# Patient Record
Sex: Male | Born: 1950 | Race: Black or African American | Hispanic: No | Marital: Single | Smoking: Never smoker
Health system: Southern US, Community
[De-identification: ages and names within clinical notes are randomized; demographics above are authoritative.]

## PROBLEM LIST (undated history)

## (undated) DIAGNOSIS — E7800 Pure hypercholesterolemia, unspecified: Secondary | ICD-10-CM

## (undated) DIAGNOSIS — L989 Disorder of the skin and subcutaneous tissue, unspecified: Secondary | ICD-10-CM

## (undated) DIAGNOSIS — E1129 Type 2 diabetes mellitus with other diabetic kidney complication: Secondary | ICD-10-CM

## (undated) DIAGNOSIS — Z973 Presence of spectacles and contact lenses: Secondary | ICD-10-CM

## (undated) DIAGNOSIS — E119 Type 2 diabetes mellitus without complications: Secondary | ICD-10-CM

## (undated) DIAGNOSIS — I10 Essential (primary) hypertension: Secondary | ICD-10-CM

## (undated) DIAGNOSIS — G629 Polyneuropathy, unspecified: Secondary | ICD-10-CM

## (undated) DIAGNOSIS — G4730 Sleep apnea, unspecified: Secondary | ICD-10-CM

## (undated) DIAGNOSIS — Z972 Presence of dental prosthetic device (complete) (partial): Secondary | ICD-10-CM

## (undated) HISTORY — PX: BACK SURGERY: SHX140

---

## 2001-07-31 ENCOUNTER — Emergency Department (HOSPITAL_COMMUNITY): Admission: EM | Admit: 2001-07-31 | Discharge: 2001-07-31 | Payer: Self-pay

## 2002-06-03 ENCOUNTER — Ambulatory Visit (HOSPITAL_BASED_OUTPATIENT_CLINIC_OR_DEPARTMENT_OTHER): Admission: RE | Admit: 2002-06-03 | Discharge: 2002-06-03 | Payer: Self-pay

## 2013-06-20 DIAGNOSIS — M7550 Bursitis of unspecified shoulder: Secondary | ICD-10-CM | POA: Insufficient documentation

## 2013-06-20 DIAGNOSIS — M75122 Complete rotator cuff tear or rupture of left shoulder, not specified as traumatic: Secondary | ICD-10-CM | POA: Insufficient documentation

## 2013-06-20 DIAGNOSIS — M19019 Primary osteoarthritis, unspecified shoulder: Secondary | ICD-10-CM | POA: Insufficient documentation

## 2013-06-20 DIAGNOSIS — M7522 Bicipital tendinitis, left shoulder: Secondary | ICD-10-CM | POA: Insufficient documentation

## 2013-07-08 DIAGNOSIS — E669 Obesity, unspecified: Secondary | ICD-10-CM | POA: Insufficient documentation

## 2013-07-08 DIAGNOSIS — G629 Polyneuropathy, unspecified: Secondary | ICD-10-CM | POA: Insufficient documentation

## 2013-07-15 DIAGNOSIS — Z9889 Other specified postprocedural states: Secondary | ICD-10-CM | POA: Insufficient documentation

## 2013-08-26 ENCOUNTER — Ambulatory Visit: Payer: Non-veteran care | Attending: Physician Assistant | Admitting: Rehabilitation

## 2013-08-26 DIAGNOSIS — M25619 Stiffness of unspecified shoulder, not elsewhere classified: Secondary | ICD-10-CM | POA: Insufficient documentation

## 2013-08-26 DIAGNOSIS — IMO0001 Reserved for inherently not codable concepts without codable children: Secondary | ICD-10-CM | POA: Insufficient documentation

## 2013-08-26 DIAGNOSIS — M25519 Pain in unspecified shoulder: Secondary | ICD-10-CM | POA: Insufficient documentation

## 2013-08-28 ENCOUNTER — Ambulatory Visit: Payer: Non-veteran care | Admitting: Rehabilitation

## 2013-08-30 ENCOUNTER — Ambulatory Visit: Payer: Non-veteran care | Admitting: Rehabilitation

## 2013-09-03 ENCOUNTER — Ambulatory Visit: Payer: Non-veteran care | Admitting: Rehabilitation

## 2013-09-10 ENCOUNTER — Ambulatory Visit: Payer: Non-veteran care | Admitting: Rehabilitation

## 2013-09-12 ENCOUNTER — Ambulatory Visit: Payer: Non-veteran care | Admitting: Rehabilitation

## 2013-09-18 ENCOUNTER — Ambulatory Visit: Payer: Non-veteran care | Admitting: Rehabilitation

## 2013-09-19 ENCOUNTER — Ambulatory Visit: Payer: Non-veteran care | Admitting: Rehabilitation

## 2013-09-25 ENCOUNTER — Ambulatory Visit: Payer: Non-veteran care | Attending: Physician Assistant | Admitting: Rehabilitation

## 2013-09-25 DIAGNOSIS — M25519 Pain in unspecified shoulder: Secondary | ICD-10-CM | POA: Insufficient documentation

## 2013-09-25 DIAGNOSIS — IMO0001 Reserved for inherently not codable concepts without codable children: Secondary | ICD-10-CM | POA: Insufficient documentation

## 2013-09-25 DIAGNOSIS — M25619 Stiffness of unspecified shoulder, not elsewhere classified: Secondary | ICD-10-CM | POA: Insufficient documentation

## 2013-09-30 ENCOUNTER — Ambulatory Visit: Payer: Non-veteran care | Admitting: Rehabilitation

## 2013-10-02 ENCOUNTER — Ambulatory Visit: Payer: Non-veteran care | Admitting: Rehabilitation

## 2013-10-07 ENCOUNTER — Ambulatory Visit: Payer: Non-veteran care | Admitting: Rehabilitation

## 2013-10-10 ENCOUNTER — Ambulatory Visit: Payer: Non-veteran care | Admitting: Rehabilitation

## 2013-10-15 ENCOUNTER — Ambulatory Visit: Payer: Non-veteran care | Admitting: Rehabilitation

## 2013-10-17 ENCOUNTER — Ambulatory Visit: Payer: Non-veteran care | Admitting: Rehabilitation

## 2013-10-23 ENCOUNTER — Ambulatory Visit: Payer: Non-veteran care | Attending: Physician Assistant | Admitting: Rehabilitation

## 2013-10-23 DIAGNOSIS — IMO0001 Reserved for inherently not codable concepts without codable children: Secondary | ICD-10-CM | POA: Insufficient documentation

## 2013-10-23 DIAGNOSIS — M25519 Pain in unspecified shoulder: Secondary | ICD-10-CM | POA: Insufficient documentation

## 2013-10-23 DIAGNOSIS — M25619 Stiffness of unspecified shoulder, not elsewhere classified: Secondary | ICD-10-CM | POA: Insufficient documentation

## 2013-10-25 ENCOUNTER — Ambulatory Visit: Payer: Non-veteran care | Admitting: Rehabilitation

## 2013-10-28 ENCOUNTER — Ambulatory Visit: Payer: Non-veteran care | Admitting: Rehabilitation

## 2013-10-31 ENCOUNTER — Ambulatory Visit: Payer: Non-veteran care | Admitting: Rehabilitation

## 2013-11-11 ENCOUNTER — Ambulatory Visit: Payer: Non-veteran care | Admitting: Rehabilitation

## 2015-02-24 ENCOUNTER — Encounter (HOSPITAL_BASED_OUTPATIENT_CLINIC_OR_DEPARTMENT_OTHER): Payer: Self-pay

## 2015-02-24 ENCOUNTER — Encounter (HOSPITAL_COMMUNITY): Admission: EM | Disposition: A | Discharge status: Home / Self Care | Payer: Self-pay | Source: Home / Self Care

## 2015-02-24 ENCOUNTER — Emergency Department (HOSPITAL_COMMUNITY): Payer: Medicare Other | Admitting: Certified Registered Nurse Anesthetist

## 2015-02-24 ENCOUNTER — Emergency Department (HOSPITAL_BASED_OUTPATIENT_CLINIC_OR_DEPARTMENT_OTHER): Payer: Medicare Other

## 2015-02-24 ENCOUNTER — Inpatient Hospital Stay (HOSPITAL_BASED_OUTPATIENT_CLINIC_OR_DEPARTMENT_OTHER)
Admission: EM | Admit: 2015-02-24 | Discharge: 2015-03-05 | DRG: 853 | Disposition: A | Discharge status: Home / Self Care | Payer: Medicare Other | Attending: Surgery | Admitting: Surgery

## 2015-02-24 DIAGNOSIS — E876 Hypokalemia: Secondary | ICD-10-CM | POA: Diagnosis not present

## 2015-02-24 DIAGNOSIS — R1031 Right lower quadrant pain: Secondary | ICD-10-CM | POA: Diagnosis present

## 2015-02-24 DIAGNOSIS — N183 Chronic kidney disease, stage 3 (moderate): Secondary | ICD-10-CM | POA: Diagnosis present

## 2015-02-24 DIAGNOSIS — E119 Type 2 diabetes mellitus without complications: Secondary | ICD-10-CM

## 2015-02-24 DIAGNOSIS — I129 Hypertensive chronic kidney disease with stage 1 through stage 4 chronic kidney disease, or unspecified chronic kidney disease: Secondary | ICD-10-CM | POA: Diagnosis present

## 2015-02-24 DIAGNOSIS — Z01811 Encounter for preprocedural respiratory examination: Secondary | ICD-10-CM

## 2015-02-24 DIAGNOSIS — Z6837 Body mass index (BMI) 37.0-37.9, adult: Secondary | ICD-10-CM | POA: Diagnosis not present

## 2015-02-24 DIAGNOSIS — E118 Type 2 diabetes mellitus with unspecified complications: Secondary | ICD-10-CM | POA: Diagnosis not present

## 2015-02-24 DIAGNOSIS — E780 Pure hypercholesterolemia: Secondary | ICD-10-CM | POA: Diagnosis present

## 2015-02-24 DIAGNOSIS — E1165 Type 2 diabetes mellitus with hyperglycemia: Secondary | ICD-10-CM | POA: Diagnosis present

## 2015-02-24 DIAGNOSIS — A419 Sepsis, unspecified organism: Principal | ICD-10-CM | POA: Diagnosis present

## 2015-02-24 DIAGNOSIS — Z794 Long term (current) use of insulin: Secondary | ICD-10-CM | POA: Diagnosis not present

## 2015-02-24 DIAGNOSIS — IMO0002 Reserved for concepts with insufficient information to code with codable children: Secondary | ICD-10-CM

## 2015-02-24 DIAGNOSIS — K352 Acute appendicitis with generalized peritonitis: Secondary | ICD-10-CM | POA: Diagnosis present

## 2015-02-24 DIAGNOSIS — E1122 Type 2 diabetes mellitus with diabetic chronic kidney disease: Secondary | ICD-10-CM | POA: Diagnosis present

## 2015-02-24 DIAGNOSIS — K353 Acute appendicitis with localized peritonitis, without perforation or gangrene: Secondary | ICD-10-CM

## 2015-02-24 DIAGNOSIS — I10 Essential (primary) hypertension: Secondary | ICD-10-CM | POA: Diagnosis not present

## 2015-02-24 DIAGNOSIS — N289 Disorder of kidney and ureter, unspecified: Secondary | ICD-10-CM | POA: Diagnosis not present

## 2015-02-24 DIAGNOSIS — K3532 Acute appendicitis with perforation and localized peritonitis, without abscess: Secondary | ICD-10-CM | POA: Diagnosis present

## 2015-02-24 DIAGNOSIS — I509 Heart failure, unspecified: Secondary | ICD-10-CM | POA: Diagnosis not present

## 2015-02-24 HISTORY — DX: Essential (primary) hypertension: I10

## 2015-02-24 HISTORY — PX: LAPAROSCOPIC APPENDECTOMY: SHX408

## 2015-02-24 HISTORY — DX: Type 2 diabetes mellitus without complications: E11.9

## 2015-02-24 HISTORY — DX: Pure hypercholesterolemia, unspecified: E78.00

## 2015-02-24 LAB — COMPREHENSIVE METABOLIC PANEL
ALT: 15 U/L — ABNORMAL LOW (ref 17–63)
AST: 21 U/L (ref 15–41)
Albumin: 3.7 g/dL (ref 3.5–5.0)
Alkaline Phosphatase: 70 U/L (ref 38–126)
Anion gap: 13 (ref 5–15)
BUN: 18 mg/dL (ref 6–20)
CO2: 22 mmol/L (ref 22–32)
Calcium: 8.7 mg/dL — ABNORMAL LOW (ref 8.9–10.3)
Chloride: 95 mmol/L — ABNORMAL LOW (ref 101–111)
Creatinine, Ser: 1.83 mg/dL — ABNORMAL HIGH (ref 0.61–1.24)
GFR calc Af Amer: 44 mL/min — ABNORMAL LOW (ref >60)
GFR calc non Af Amer: 38 mL/min — ABNORMAL LOW (ref >60)
Glucose, Bld: 536 mg/dL — ABNORMAL HIGH (ref 65–99)
Potassium: 4.7 mmol/L (ref 3.5–5.1)
Sodium: 130 mmol/L — ABNORMAL LOW (ref 135–145)
Total Bilirubin: 1.7 mg/dL — ABNORMAL HIGH (ref 0.3–1.2)
Total Protein: 7.8 g/dL (ref 6.5–8.1)

## 2015-02-24 LAB — CBC WITH DIFFERENTIAL/PLATELET
Basophils Absolute: 0 10*3/uL (ref 0.0–0.1)
Basophils Relative: 0 % (ref 0–1)
EOS ABS: 0 10*3/uL (ref 0.0–0.7)
EOS PCT: 0 % (ref 0–5)
HEMATOCRIT: 38 % — AB (ref 39.0–52.0)
Hemoglobin: 13.6 g/dL (ref 13.0–17.0)
LYMPHS PCT: 5 % — AB (ref 12–46)
Lymphs Abs: 0.9 10*3/uL (ref 0.7–4.0)
MCH: 30.8 pg (ref 26.0–34.0)
MCHC: 35.8 g/dL (ref 30.0–36.0)
MCV: 86.2 fL (ref 78.0–100.0)
MONOS PCT: 5 % (ref 3–12)
Monocytes Absolute: 0.8 10*3/uL (ref 0.1–1.0)
Neutro Abs: 14.8 10*3/uL — ABNORMAL HIGH (ref 1.7–7.7)
Neutrophils Relative %: 90 % — ABNORMAL HIGH (ref 43–77)
Platelets: 283 10*3/uL (ref 150–400)
RBC: 4.41 MIL/uL (ref 4.22–5.81)
RDW: 12.3 % (ref 11.5–15.5)
WBC: 16.5 10*3/uL — AB (ref 4.0–10.5)

## 2015-02-24 LAB — URINE MICROSCOPIC-ADD ON

## 2015-02-24 LAB — GLUCOSE, CAPILLARY
GLUCOSE-CAPILLARY: 368 mg/dL — AB (ref 65–99)
Glucose-Capillary: 364 mg/dL — ABNORMAL HIGH (ref 65–99)

## 2015-02-24 LAB — LIPASE, BLOOD: Lipase: 18 U/L — ABNORMAL LOW (ref 22–51)

## 2015-02-24 LAB — URINALYSIS, ROUTINE W REFLEX MICROSCOPIC
Bilirubin Urine: NEGATIVE
Ketones, ur: 15 mg/dL — AB
Leukocytes, UA: NEGATIVE
Nitrite: NEGATIVE
PROTEIN: 100 mg/dL — AB
SPECIFIC GRAVITY, URINE: 1.043 — AB (ref 1.005–1.030)
UROBILINOGEN UA: 0.2 mg/dL (ref 0.0–1.0)
pH: 5.5 (ref 5.0–8.0)

## 2015-02-24 LAB — CBG MONITORING, ED
GLUCOSE-CAPILLARY: 426 mg/dL — AB (ref 65–99)
Glucose-Capillary: 445 mg/dL — ABNORMAL HIGH (ref 65–99)

## 2015-02-24 SURGERY — APPENDECTOMY, LAPAROSCOPIC
Anesthesia: General | Site: Abdomen

## 2015-02-24 MED ORDER — SODIUM CHLORIDE 0.9 % IV BOLUS (SEPSIS)
1000.0000 mL | Freq: Once | INTRAVENOUS | Status: AC
  Administered 2015-02-24: 1000 mL via INTRAVENOUS

## 2015-02-24 MED ORDER — NEOSTIGMINE METHYLSULFATE 10 MG/10ML IV SOLN
INTRAVENOUS | Status: AC
  Filled 2015-02-24: qty 1

## 2015-02-24 MED ORDER — HYDROMORPHONE HCL 1 MG/ML IJ SOLN
1.0000 mg | Freq: Once | INTRAMUSCULAR | Status: AC
  Administered 2015-02-24: 1 mg via INTRAVENOUS
  Filled 2015-02-24: qty 1

## 2015-02-24 MED ORDER — GLYCOPYRROLATE 0.2 MG/ML IJ SOLN
INTRAMUSCULAR | Status: DC | PRN
  Administered 2015-02-24: .4 mg via INTRAVENOUS

## 2015-02-24 MED ORDER — NEOSTIGMINE METHYLSULFATE 10 MG/10ML IV SOLN
INTRAVENOUS | Status: DC | PRN
  Administered 2015-02-24: 2 mg via INTRAVENOUS

## 2015-02-24 MED ORDER — PROPOFOL 10 MG/ML IV BOLUS
INTRAVENOUS | Status: AC
  Filled 2015-02-24: qty 20

## 2015-02-24 MED ORDER — INSULIN ASPART 100 UNIT/ML ~~LOC~~ SOLN
SUBCUTANEOUS | Status: AC
  Filled 2015-02-24: qty 1

## 2015-02-24 MED ORDER — LABETALOL HCL 5 MG/ML IV SOLN
5.0000 mg | INTRAVENOUS | Status: DC | PRN
  Administered 2015-02-24 (×2): 5 mg via INTRAVENOUS

## 2015-02-24 MED ORDER — PIPERACILLIN-TAZOBACTAM 3.375 G IVPB 30 MIN
3.3750 g | Freq: Once | INTRAVENOUS | Status: AC
  Administered 2015-02-24: 3.375 g via INTRAVENOUS
  Filled 2015-02-24 (×2): qty 50

## 2015-02-24 MED ORDER — ROCURONIUM BROMIDE 100 MG/10ML IV SOLN
INTRAVENOUS | Status: DC | PRN
  Administered 2015-02-24 (×2): 10 mg via INTRAVENOUS
  Administered 2015-02-24: 30 mg via INTRAVENOUS

## 2015-02-24 MED ORDER — BUPIVACAINE HCL (PF) 0.5 % IJ SOLN
INTRAMUSCULAR | Status: DC | PRN
  Administered 2015-02-24: 7 mL

## 2015-02-24 MED ORDER — LABETALOL HCL 5 MG/ML IV SOLN
INTRAVENOUS | Status: AC
  Filled 2015-02-24: qty 4

## 2015-02-24 MED ORDER — LACTATED RINGERS IV SOLN: INTRAVENOUS | Status: DC

## 2015-02-24 MED ORDER — GLYCOPYRROLATE 0.2 MG/ML IJ SOLN
INTRAMUSCULAR | Status: AC
  Filled 2015-02-24: qty 3

## 2015-02-24 MED ORDER — FENTANYL CITRATE (PF) 250 MCG/5ML IJ SOLN
INTRAMUSCULAR | Status: DC | PRN
  Administered 2015-02-24: 100 ug via INTRAVENOUS
  Administered 2015-02-24 (×2): 50 ug via INTRAVENOUS

## 2015-02-24 MED ORDER — LABETALOL HCL 5 MG/ML IV SOLN
INTRAVENOUS | Status: DC | PRN
Start: 2015-02-24 — End: 2015-02-25
  Administered 2015-02-24: 10 mg via INTRAVENOUS
  Administered 2015-02-24: 5 mg via INTRAVENOUS

## 2015-02-24 MED ORDER — SODIUM CHLORIDE 0.9 % IV SOLN
INTRAVENOUS | Status: DC | PRN
  Administered 2015-02-24 (×2): via INTRAVENOUS

## 2015-02-24 MED ORDER — INSULIN REGULAR BOLUS VIA INFUSION: 0.0000 [IU] | Freq: Three times a day (TID) | INTRAVENOUS | Status: DC | PRN

## 2015-02-24 MED ORDER — BUPIVACAINE HCL (PF) 0.5 % IJ SOLN
INTRAMUSCULAR | Status: AC
  Filled 2015-02-24: qty 30

## 2015-02-24 MED ORDER — SODIUM CHLORIDE 0.9 % IV SOLN
INTRAVENOUS | Status: DC
  Filled 2015-02-24: qty 2.5

## 2015-02-24 MED ORDER — IOHEXOL 300 MG/ML  SOLN
25.0000 mL | Freq: Once | INTRAMUSCULAR | Status: AC | PRN
  Administered 2015-02-24: 25 mL via ORAL

## 2015-02-24 MED ORDER — DEXTROSE-NACL 5-0.45 % IV SOLN: INTRAVENOUS | Status: DC

## 2015-02-24 MED ORDER — INSULIN ASPART 100 UNIT/ML ~~LOC~~ SOLN
SUBCUTANEOUS | Status: DC | PRN
  Administered 2015-02-24: 5 [IU] via INTRAVENOUS

## 2015-02-24 MED ORDER — HYDROMORPHONE HCL 1 MG/ML IJ SOLN: 0.2500 mg | INTRAMUSCULAR | Status: DC | PRN

## 2015-02-24 MED ORDER — SUCCINYLCHOLINE CHLORIDE 20 MG/ML IJ SOLN
INTRAMUSCULAR | Status: DC | PRN
  Administered 2015-02-24: 100 mg via INTRAVENOUS

## 2015-02-24 MED ORDER — PROPOFOL 10 MG/ML IV BOLUS
INTRAVENOUS | Status: DC | PRN
  Administered 2015-02-24: 200 mg via INTRAVENOUS

## 2015-02-24 MED ORDER — INSULIN ASPART 100 UNIT/ML IV SOLN
10.0000 [IU] | Freq: Once | INTRAVENOUS | Status: AC
  Administered 2015-02-24: 10 [IU] via INTRAVENOUS
  Filled 2015-02-24: qty 1

## 2015-02-24 MED ORDER — ONDANSETRON HCL 4 MG/2ML IJ SOLN
INTRAMUSCULAR | Status: DC | PRN
  Administered 2015-02-24: 4 mg via INTRAVENOUS

## 2015-02-24 MED ORDER — FENTANYL CITRATE (PF) 250 MCG/5ML IJ SOLN
INTRAMUSCULAR | Status: AC
  Filled 2015-02-24: qty 5

## 2015-02-24 MED ORDER — ONDANSETRON HCL 4 MG/2ML IJ SOLN
4.0000 mg | Freq: Once | INTRAMUSCULAR | Status: AC
  Administered 2015-02-24: 4 mg via INTRAVENOUS
  Filled 2015-02-24: qty 2

## 2015-02-24 MED ORDER — SODIUM CHLORIDE 0.9 % IV SOLN
INTRAVENOUS | Status: DC | PRN
  Filled 2015-02-24: qty 2.5

## 2015-02-24 MED ORDER — ROCURONIUM BROMIDE 100 MG/10ML IV SOLN
INTRAVENOUS | Status: AC
  Filled 2015-02-24: qty 1

## 2015-02-24 MED ORDER — IOHEXOL 300 MG/ML  SOLN
80.0000 mL | Freq: Once | INTRAMUSCULAR | Status: AC | PRN
  Administered 2015-02-24: 80 mL via INTRAVENOUS

## 2015-02-24 MED ORDER — INSULIN ASPART 100 UNIT/ML ~~LOC~~ SOLN: 0.0000 [IU] | SUBCUTANEOUS | Status: DC

## 2015-02-24 MED ORDER — ONDANSETRON HCL 4 MG/2ML IJ SOLN
INTRAMUSCULAR | Status: AC
  Filled 2015-02-24: qty 2

## 2015-02-24 SURGICAL SUPPLY — 44 items
APPLIER CLIP 5 13 M/L LIGAMAX5 (MISCELLANEOUS)
APPLIER CLIP ROT 10 11.4 M/L (STAPLE)
BENZOIN TINCTURE PRP APPL 2/3 (GAUZE/BANDAGES/DRESSINGS) ×3 IMPLANT
CHLORAPREP W/TINT 26ML (MISCELLANEOUS) ×3 IMPLANT
CLIP APPLIE 5 13 M/L LIGAMAX5 (MISCELLANEOUS) IMPLANT
CLIP APPLIE ROT 10 11.4 M/L (STAPLE) IMPLANT
CLOSURE WOUND 1/2 X4 (GAUZE/BANDAGES/DRESSINGS) ×1
CUTTER FLEX LINEAR 45M (STAPLE) ×3 IMPLANT
DECANTER SPIKE VIAL GLASS SM (MISCELLANEOUS) ×3 IMPLANT
DRAIN CHANNEL 19F RND (DRAIN) ×3 IMPLANT
DRAPE LAPAROSCOPIC ABDOMINAL (DRAPES) ×3 IMPLANT
DRSG TEGADERM 2-3/8X2-3/4 SM (GAUZE/BANDAGES/DRESSINGS) ×3 IMPLANT
ELECT REM PT RETURN 9FT ADLT (ELECTROSURGICAL) ×3
ELECTRODE REM PT RTRN 9FT ADLT (ELECTROSURGICAL) ×1 IMPLANT
ENDOLOOP SUT PDS II  0 18 (SUTURE)
ENDOLOOP SUT PDS II 0 18 (SUTURE) IMPLANT
EVACUATOR SILICONE 100CC (DRAIN) ×3 IMPLANT
GAUZE SPONGE 2X2 8PLY STRL LF (GAUZE/BANDAGES/DRESSINGS) ×1 IMPLANT
GAUZE SPONGE 4X4 12PLY STRL (GAUZE/BANDAGES/DRESSINGS) ×3 IMPLANT
GLOVE ECLIPSE 8.0 STRL XLNG CF (GLOVE) ×3 IMPLANT
GLOVE INDICATOR 8.0 STRL GRN (GLOVE) ×3 IMPLANT
GOWN STRL REUS W/TWL XL LVL3 (GOWN DISPOSABLE) ×6 IMPLANT
KIT BASIN OR (CUSTOM PROCEDURE TRAY) ×3 IMPLANT
POUCH SPECIMEN RETRIEVAL 10MM (ENDOMECHANICALS) ×3 IMPLANT
RELOAD 45 VASCULAR/THIN (ENDOMECHANICALS) IMPLANT
RELOAD STAPLE TA45 3.5 REG BLU (ENDOMECHANICALS) ×3 IMPLANT
SCISSORS LAP 5X35 DISP (ENDOMECHANICALS) IMPLANT
SET IRRIG TUBING LAPAROSCOPIC (IRRIGATION / IRRIGATOR) ×3 IMPLANT
SHEARS HARMONIC ACE PLUS 36CM (ENDOMECHANICALS) ×3 IMPLANT
SLEEVE XCEL OPT CAN 5 100 (ENDOMECHANICALS) ×3 IMPLANT
SOLUTION ANTI FOG 6CC (MISCELLANEOUS) ×3 IMPLANT
SPONGE GAUZE 2X2 STER 10/PKG (GAUZE/BANDAGES/DRESSINGS) ×2
STAPLER VISISTAT (STAPLE) ×3 IMPLANT
STRIP CLOSURE SKIN 1/2X4 (GAUZE/BANDAGES/DRESSINGS) ×2 IMPLANT
SUT ETHILON 3 0 PS 1 (SUTURE) ×3 IMPLANT
SUT MNCRL AB 4-0 PS2 18 (SUTURE) ×3 IMPLANT
TAPE CLOTH SURG 4X10 WHT LF (GAUZE/BANDAGES/DRESSINGS) ×3 IMPLANT
TOWEL OR 17X26 10 PK STRL BLUE (TOWEL DISPOSABLE) ×3 IMPLANT
TOWEL OR NON WOVEN STRL DISP B (DISPOSABLE) ×3 IMPLANT
TRAY FOLEY W/METER SILVER 14FR (SET/KITS/TRAYS/PACK) ×3 IMPLANT
TRAY LAPAROSCOPIC (CUSTOM PROCEDURE TRAY) ×3 IMPLANT
TROCAR BLADELESS OPT 5 100 (ENDOMECHANICALS) ×3 IMPLANT
TROCAR XCEL BLUNT TIP 100MML (ENDOMECHANICALS) ×3 IMPLANT
TUBING INSUFFLATION 10FT LAP (TUBING) ×3 IMPLANT

## 2015-02-24 NOTE — ED Notes (Signed)
He will be going to [facility] ED.

## 2015-02-24 NOTE — ED Provider Notes (Signed)
CSN: [id number]     Arrival date & time 02/24/15  1520 History   First MD Initiated Contact with Patient 02/24/15 1520     Chief Complaint  Patient presents with  . Abdominal Pain     (Consider location/radiation/quality/duration/timing/severity/associated sxs/prior Treatment) Patient is a 64 y.o. male presenting with abdominal pain.  Abdominal Pain Pain location:  RLQ Pain quality: aching and sharp   Pain radiates to:  Does not radiate Pain severity:  Moderate Onset quality:  Gradual Duration:  1 week Timing:  Constant Progression:  Unchanged Chronicity:  New Context: not previous surgeries and not recent illness   Relieved by:  Nothing Worsened by:  Palpation and movement Associated symptoms: nausea and vomiting   Associated symptoms: no anorexia, no constipation, no fever and no shortness of breath     Past Medical History  Diagnosis Date  . Diabetes mellitus without complication   . Hypertension   . Hypercholesteremia    Past Surgical History  Procedure Laterality Date  . Back surgery     No family history on file. History  Substance Use Topics  . Smoking status: Never Smoker   . Smokeless tobacco: Not on file  . Alcohol Use: No    Review of Systems  Constitutional: Negative for fever.  Respiratory: Negative for shortness of breath.   Gastrointestinal: Positive for nausea, vomiting and abdominal pain. Negative for constipation and anorexia.  All other systems reviewed and are negative.     Allergies  Review of patient's allergies indicates no known allergies.  Home Medications   Prior to Admission medications   Medication Sig Start Date End Date Taking? Authorizing Provider  amLODipine (NORVASC) 10 MG tablet Take 10 mg by mouth daily.   Yes Historical Provider, MD  atorvastatin (LIPITOR) 80 MG tablet Take 40 mg by mouth at bedtime.   Yes Historical Provider, MD  chlorthalidone (HYGROTON) 25 MG tablet Take 12.5 mg by mouth daily.   Yes Historical  Provider, MD  cloNIDine (CATAPRES) 0.2 MG tablet Take 0.2 mg by mouth 3 (three) times daily.   Yes Historical Provider, MD  enalapril (VASOTEC) 20 MG tablet Take 20 mg by mouth 2 (two) times daily.   Yes Historical Provider, MD  gabapentin (NEURONTIN) 800 MG tablet Take 800 mg by mouth 3 (three) times daily.   Yes Historical Provider, MD  insulin aspart (NOVOLOG FLEXPEN) 100 UNIT/ML FlexPen Inject 4.6 Units into the skin 2 (two) times daily.   Yes Historical Provider, MD  Insulin Glargine (LANTUS SOLOSTAR) 100 UNIT/ML Solostar Pen Inject 5 Units into the skin 3 (three) times daily.   Yes Historical Provider, MD  magnesium hydroxide (MILK OF MAGNESIA) 400 MG/5ML suspension Take 30 mLs by mouth daily as needed for mild constipation.   Yes Historical Provider, MD  metoprolol succinate (TOPROL-XL) 50 MG 24 hr tablet Take 25 mg by mouth 2 (two) times daily. Take with or immediately following a meal.   Yes Historical Provider, MD  spironolactone (ALDACTONE) 25 MG tablet Take 50 mg by mouth daily.   Yes Historical Provider, MD   BP 172/81 mmHg  Pulse 86  Temp(Src) 99.3 F (37.4 C) (Oral)  Resp 25  Ht 6' (1.829 m)  Wt 280 lb (127.007 kg)  BMI 37.97 kg/m2  SpO2 97% Physical Exam  Constitutional: He is oriented to person, place, and time. He appears well-developed and well-nourished.  HENT:  Head: Normocephalic and atraumatic.  Eyes: Conjunctivae and EOM are normal.  Neck: Normal range of motion.  Neck supple.  Cardiovascular: Normal rate, regular rhythm and normal heart sounds.   Pulmonary/Chest: Effort normal and breath sounds normal. No respiratory distress.  Abdominal: He exhibits no distension. There is tenderness in the right lower quadrant. There is no rebound and no guarding.  Musculoskeletal: Normal range of motion.  Neurological: He is alert and oriented to person, place, and time.  Skin: Skin is warm and dry.  Vitals reviewed.   ED Course  Procedures (including critical care  time) Labs Review Labs Reviewed  COMPREHENSIVE METABOLIC PANEL - Abnormal; Notable for the following:    Sodium 130 (*)    Chloride 95 (*)    Glucose, Bld 536 (*)    Creatinine, Ser 1.83 (*)    Calcium 8.7 (*)    ALT 15 (*)    Total Bilirubin 1.7 (*)    GFR calc non Af Amer 38 (*)    GFR calc Af Amer 44 (*)    All other components within normal limits  LIPASE, BLOOD - Abnormal; Notable for the following:    Lipase 18 (*)    All other components within normal limits  CBC WITH DIFFERENTIAL/PLATELET - Abnormal; Notable for the following:    WBC 16.5 (*)    HCT 38.0 (*)    Neutrophils Relative % 90 (*)    Neutro Abs 14.8 (*)    Lymphocytes Relative 5 (*)    All other components within normal limits  URINALYSIS, ROUTINE W REFLEX MICROSCOPIC (NOT AT [facility]) - Abnormal; Notable for the following:    Specific Gravity, Urine 1.043 (*)    Glucose, UA >1000 (*)    Hgb urine dipstick SMALL (*)    Ketones, ur 15 (*)    Protein, ur 100 (*)    All other components within normal limits  URINE MICROSCOPIC-ADD ON - Abnormal; Notable for the following:    Bacteria, UA MANY (*)    Casts HYALINE CASTS (*)    All other components within normal limits  GLUCOSE, CAPILLARY - Abnormal; Notable for the following:    Glucose-Capillary 364 (*)    All other components within normal limits  GLUCOSE, CAPILLARY - Abnormal; Notable for the following:    Glucose-Capillary 368 (*)    All other components within normal limits  CBG MONITORING, ED - Abnormal; Notable for the following:    Glucose-Capillary 445 (*)    All other components within normal limits  CBG MONITORING, ED - Abnormal; Notable for the following:    Glucose-Capillary 426 (*)    All other components within normal limits  SURGICAL PATHOLOGY    Imaging Review Dg Chest 1 View  02/24/2015   CLINICAL DATA:  Preop chest examination  EXAM: CHEST  1 VIEW  COMPARISON:  07/19/2013  FINDINGS: Cardiomediastinal silhouette is unremarkable. Mild  elevation of the right hemidiaphragm. No acute infiltrate or pleural effusion. No pulmonary edema. Degenerative changes right AC joint and distal clavicle.  IMPRESSION: No active disease.   Electronically Signed   By: Natasha Mead M.D.   On: 02/24/2015 18:07   Ct Abdomen Pelvis W Contrast  02/24/2015   CLINICAL DATA:  Patient with right lower quadrant pain for 1 week. Nausea and vomiting.  EXAM: CT ABDOMEN AND PELVIS WITH CONTRAST  TECHNIQUE: Multidetector CT imaging of the abdomen and pelvis was performed using the standard protocol following bolus administration of intravenous contrast.  CONTRAST:  80 mL Omnipaque 300  COMPARISON:  None.  FINDINGS: Lower chest: Bilateral gynecomastia. Small hiatal hernia. Dependent  atelectasis bilateral lower lobes.  Hepatobiliary: Liver is normal in size and contour without focal hepatic lesion identified. Gallbladder is unremarkable. No intrahepatic or extrahepatic biliary ductal dilatation.  Pancreas: Unremarkable  Spleen: Unremarkable  Adrenals/Urinary Tract: Adrenal glands are normal. There is a 2 cm cyst within the interpolar region of the left kidney. Kidneys enhance symmetrically with contrast. Nonspecific perinephric fat stranding. No hydronephrosis. There is mild dilatation of the mid right ureter at the level of right lower quadrant inflammatory change.  Stomach/Bowel: The appendix is dilated measuring up to 18 mm with extensive periappendiceal fat stranding. Additionally there is a small amount of fluid within the right lower quadrant extending along the right paracolic gutter. Small amount of fluid within the mesentery. No evidence for bowel obstruction. No free intraperitoneal air. Circumferential wall thickening of the distal ileum likely reactive from adjacent inflammation involving the appendix.  Vascular/Lymphatic: Normal caliber abdominal aorta. No retroperitoneal lymphadenopathy.  Other: There is a moderate-sized right inguinal hernia predominantly containing  fat. There is associated soft tissue which is nonspecific however may represent complicated fluid.  Musculoskeletal: Lower lumbar spine degenerative changes. No aggressive or acute appearing osseous lesions.  IMPRESSION: Findings compatible with acute appendicitis. There is surrounding fluid within the right lower quadrant and paracolic gutter.  Critical Value/emergent results were called by telephone at the time of interpretation on 02/24/2015 at 5:11 pm to Dr. Mirian Mo , who verbally acknowledged these results.   Electronically Signed   By: Annia Belt M.D.   On: 02/24/2015 17:11     EKG Interpretation   Date/Time:  Tuesday February 24 2015 17:41:31 EDT Ventricular Rate:  99 PR Interval:  156 QRS Duration: 76 QT Interval:  352 QTC Calculation: 451 R Axis:   -21 Text Interpretation:  Normal sinus rhythm Low voltage QRS Nonspecific ST  and T wave abnormality Abnormal ECG q waves new in anterior leads  Confirmed by Mirian Mo [id number]) on 02/24/2015 6:09:07 PM      MDM   Final diagnoses:  RLQ abdominal pain  Acute appendicitis with localized peritonitis    64 y.o. male with pertinent PMH of DM, HTN presents with RLQ pain, fever, n/v/d.  Physical exam with noted RLQ tenderness.  Given  IV insulin and 2L NS bolus for glucose control.  WU with appendicitis.  Transferred to [facility] for surgical care.    I have reviewed all laboratory and imaging studies if ordered as above  1. Acute appendicitis with localized peritonitis   2. RLQ abdominal pain   3. Pre-op chest exam         Mirian Mo, MD 02/24/15 [phone]

## 2015-02-24 NOTE — ED Notes (Signed)
Pt reports 1 week of rlq pain and vomiting.  Denies diarrhea or dysuria.

## 2015-02-24 NOTE — ED Provider Notes (Signed)
Patient has been transferred from [facility] for surgical treatment of appendicitis. Dr. Littie Deedsgentry had contacted me and advised that surgery was anticipating the patient for management. I have checked the patient and Dr. Abbey Chattersosenbower was in the room discussing his surgical management. The patient is alert and in no acute distress. Dr. Suzzanne Cloudosen Bauer reports he will address patient's pain management needs. He does request at this time for increased blood sugar management. At this time I will initiate glucose stabilizer for blood sugar control.  Arby BarretteMarcy Skylen Spiering, MD 02/24/15 2053

## 2015-02-24 NOTE — ED Notes (Signed)
Bed: [id number] Expected date:  Expected time:  Means of arrival:  Comments: [facility] transfer to go to rm 19

## 2015-02-24 NOTE — ED Notes (Signed)
Report called to CRNA 

## 2015-02-24 NOTE — Transfer of Care (Signed)
Immediate Anesthesia Transfer of Care Note  Patient: Richard Russell  Procedure(s) Performed: Procedure(s): APPENDECTOMY LAPAROSCOPIC (N/A)  Patient Location: PACU  Anesthesia Type:General  Level of Consciousness: awake, oriented, patient cooperative, lethargic and responds to stimulation  Airway & Oxygen Therapy: Patient Spontanous Breathing and Patient connected to face mask oxygen  Post-op Assessment: Report given to RN, Post -op Vital signs reviewed and stable and Patient moving all extremities  Post vital signs: Reviewed and stable  Last Vitals:  Filed Vitals:   02/24/15 2059  BP: 156/86  Pulse: 112  Temp:   Resp: 20    Complications: No apparent anesthesia complications

## 2015-02-24 NOTE — Anesthesia Preprocedure Evaluation (Addendum)
Anesthesia Evaluation  Patient identified by MRN, date of birth, ID band Patient awake    Reviewed: Allergy & Precautions, H&P , NPO status , Patient's Chart, lab work & pertinent test results  Airway Mallampati: III  TM Distance: >3 FB Neck ROM: full    Dental  (+) Dental Advisory Given, Poor Dentition, Missing Most of lower teeth missing and upper teeth in bad shape:   Pulmonary neg pulmonary ROS,  breath sounds clear to auscultation  Pulmonary exam normal       Cardiovascular Exercise Tolerance: Good hypertension, Pt. on medications Normal cardiovascular examRhythm:regular Rate:Normal     Neuro/Psych negative neurological ROS  negative psych ROS   GI/Hepatic negative GI ROS, Neg liver ROS,   Endo/Other  diabetes, Well Controlled, Type 2, Insulin Dependent, Oral Hypoglycemic AgentsGlucose 536 at 1530  Renal/GU Renal diseaseCRT 1.83  negative genitourinary   Musculoskeletal   Abdominal   Peds  Hematology negative hematology ROS (+)   Anesthesia Other Findings Na 130  Reproductive/Obstetrics negative OB ROS                            Anesthesia Physical Anesthesia Plan  ASA: III and emergent  Anesthesia Plan: General   Post-op Pain Management:    Induction: Intravenous, Rapid sequence and Cricoid pressure planned  Airway Management Planned: Oral ETT  Additional Equipment:   Intra-op Plan:   Post-operative Plan: Extubation in OR  Informed Consent: I have reviewed the patients History and Physical, chart, labs and discussed the procedure including the risks, benefits and alternatives for the proposed anesthesia with the patient or authorized representative who has indicated his/her understanding and acceptance.   Dental Advisory Given  Plan Discussed with: CRNA and Surgeon  Anesthesia Plan Comments:         Anesthesia Quick Evaluation

## 2015-02-24 NOTE — H&P (Signed)
Richard Russell is an 64 y.o. male.   Chief Complaint: Acute appendicitis HPI: This is a 64 year old male transferred from [place] because of acute appendicitis. 8 days ago, he began having vague periumbilical pain. Yesterday, the pain radiated to the right lower quadrant and became more intense. The pain was associated with fever as well as nausea and vomiting. He presented to the [facility] and was evaluated. He was noted to have a leukocytosis. CT scan demonstrated findings consistent with acute appendicitis. There is some free fluid in the right pelvic area as well. He was transferred here for further treatment. He has received IV Zosyn. His initial blood sugar was greater than 500.  Past Medical History  Diagnosis Date  . Diabetes mellitus without complication   . Hypertension   . Hypercholesteremia     Past Surgical History  Procedure Laterality Date  . Back surgery      No family history on file. Social History:  reports that he has never smoked. He does not have any smokeless tobacco history on file. He reports that he does not drink alcohol or use illicit drugs.  Allergies: No Known Allergies   (Not in a hospital admission)  Results for orders placed or performed during the hospital encounter of 02/24/15 (from the past 48 hour(s))  Comprehensive metabolic panel     Status: Abnormal   Collection Time: 02/24/15  3:30 PM  Result Value Ref Range   Sodium 130 (L) 135 - 145 mmol/L   Potassium 4.7 3.5 - 5.1 mmol/L   Chloride 95 (L) 101 - 111 mmol/L   CO2 22 22 - 32 mmol/L   Glucose, Bld 536 (H) 65 - 99 mg/dL   BUN 18 6 - 20 mg/dL   Creatinine, Ser 1.83 (H) 0.61 - 1.24 mg/dL   Calcium 8.7 (L) 8.9 - 10.3 mg/dL   Total Protein 7.8 6.5 - 8.1 g/dL   Albumin 3.7 3.5 - 5.0 g/dL   AST 21 15 - 41 U/L   ALT 15 (L) 17 - 63 U/L   Alkaline Phosphatase 70 38 - 126 U/L   Total Bilirubin 1.7 (H) 0.3 - 1.2 mg/dL   GFR calc non Af Amer 38 (L) >60 mL/min    GFR calc Af Amer 44 (L) >60 mL/min    Comment: (NOTE) The eGFR has been calculated using the CKD EPI equation. This calculation has not been validated in all clinical situations. eGFR's persistently <60 mL/min signify possible Chronic Kidney Disease.    Anion gap 13 5 - 15  Lipase, blood     Status: Abnormal   Collection Time: 02/24/15  3:30 PM  Result Value Ref Range   Lipase 18 (L) 22 - 51 U/L  CBC with Differential     Status: Abnormal   Collection Time: 02/24/15  3:30 PM  Result Value Ref Range   WBC 16.5 (H) 4.0 - 10.5 K/uL   RBC 4.41 4.22 - 5.81 MIL/uL   Hemoglobin 13.6 13.0 - 17.0 g/dL   HCT 38.0 (L) 39.0 - 52.0 %   MCV 86.2 78.0 - 100.0 fL   MCH 30.8 26.0 - 34.0 pg   MCHC 35.8 30.0 - 36.0 g/dL   RDW 12.3 11.5 - 15.5 %   Platelets 283 150 - 400 K/uL   Neutrophils Relative % 90 (H) 43 - 77 %   Neutro Abs 14.8 (H) 1.7 - 7.7 K/uL   Lymphocytes Relative 5 (L) 12 - 46 %  Lymphs Abs 0.9 0.7 - 4.0 K/uL   Monocytes Relative 5 3 - 12 %   Monocytes Absolute 0.8 0.1 - 1.0 K/uL   Eosinophils Relative 0 0 - 5 %   Eosinophils Absolute 0.0 0.0 - 0.7 K/uL   Basophils Relative 0 0 - 1 %   Basophils Absolute 0.0 0.0 - 0.1 K/uL  Urinalysis, Routine w reflex microscopic (not at [facility])     Status: Abnormal   Collection Time: 02/24/15  4:00 PM  Result Value Ref Range   Color, Urine YELLOW YELLOW   APPearance CLEAR CLEAR   Specific Gravity, Urine 1.043 (H) 1.005 - 1.030   pH 5.5 5.0 - 8.0   Glucose, UA >1000 (A) NEGATIVE mg/dL   Hgb urine dipstick SMALL (A) NEGATIVE   Bilirubin Urine NEGATIVE NEGATIVE   Ketones, ur 15 (A) NEGATIVE mg/dL   Protein, ur 100 (A) NEGATIVE mg/dL   Urobilinogen, UA 0.2 0.0 - 1.0 mg/dL   Nitrite NEGATIVE NEGATIVE   Leukocytes, UA NEGATIVE NEGATIVE  Urine microscopic-add on     Status: Abnormal   Collection Time: 02/24/15  4:00 PM  Result Value Ref Range   RBC / HPF 3-6 <3 RBC/hpf   Bacteria, UA MANY (A) RARE   Casts HYALINE CASTS (A) NEGATIVE    Urine-Other MUCOUS PRESENT   CBG monitoring, ED     Status: Abnormal   Collection Time: 02/24/15  6:12 PM  Result Value Ref Range   Glucose-Capillary 445 (H) 65 - 99 mg/dL  CBG monitoring, ED     Status: Abnormal   Collection Time: 02/24/15  8:05 PM  Result Value Ref Range   Glucose-Capillary 426 (H) 65 - 99 mg/dL   Dg Chest 1 View  02/24/2015   CLINICAL DATA:  Preop chest examination  EXAM: CHEST  1 VIEW  COMPARISON:  07/19/2013  FINDINGS: Cardiomediastinal silhouette is unremarkable. Mild elevation of the right hemidiaphragm. No acute infiltrate or pleural effusion. No pulmonary edema. Degenerative changes right AC joint and distal clavicle.  IMPRESSION: No active disease.   Electronically Signed   By: Lahoma Crocker M.D.   On: 02/24/2015 18:07   Ct Abdomen Pelvis W Contrast  02/24/2015   CLINICAL DATA:  Patient with right lower quadrant pain for 1 week. Nausea and vomiting.  EXAM: CT ABDOMEN AND PELVIS WITH CONTRAST  TECHNIQUE: Multidetector CT imaging of the abdomen and pelvis was performed using the standard protocol following bolus administration of intravenous contrast.  CONTRAST:  80 mL Omnipaque 300  COMPARISON:  None.  FINDINGS: Lower chest: Bilateral gynecomastia. Small hiatal hernia. Dependent atelectasis bilateral lower lobes.  Hepatobiliary: Liver is normal in size and contour without focal hepatic lesion identified. Gallbladder is unremarkable. No intrahepatic or extrahepatic biliary ductal dilatation.  Pancreas: Unremarkable  Spleen: Unremarkable  Adrenals/Urinary Tract: Adrenal glands are normal. There is a 2 cm cyst within the interpolar region of the left kidney. Kidneys enhance symmetrically with contrast. Nonspecific perinephric fat stranding. No hydronephrosis. There is mild dilatation of the mid right ureter at the level of right lower quadrant inflammatory change.  Stomach/Bowel: The appendix is dilated measuring up to 18 mm with extensive periappendiceal fat stranding. Additionally  there is a small amount of fluid within the right lower quadrant extending along the right paracolic gutter. Small amount of fluid within the mesentery. No evidence for bowel obstruction. No free intraperitoneal air. Circumferential wall thickening of the distal ileum likely reactive from adjacent inflammation involving the appendix.  Vascular/Lymphatic: Normal caliber abdominal  aorta. No retroperitoneal lymphadenopathy.  Other: There is a moderate-sized right inguinal hernia predominantly containing fat. There is associated soft tissue which is nonspecific however may represent complicated fluid.  Musculoskeletal: Lower lumbar spine degenerative changes. No aggressive or acute appearing osseous lesions.  IMPRESSION: Findings compatible with acute appendicitis. There is surrounding fluid within the right lower quadrant and paracolic gutter.  Critical Value/emergent results were called by telephone at the time of interpretation on 02/24/2015 at 5:11 pm to Dr. Debby Freiberg , who verbally acknowledged these results.   Electronically Signed   By: Lovey Newcomer M.D.   On: 02/24/2015 17:11    Review of Systems  Constitutional: Positive for fever. Negative for chills.  Respiratory: Positive for shortness of breath (due to abdominal pain).   Cardiovascular: Negative for chest pain.  Gastrointestinal: Positive for nausea, vomiting, abdominal pain and diarrhea. Negative for blood in stool.  Genitourinary: Positive for dysuria. Negative for hematuria.  Neurological: Negative for focal weakness.  Endo/Heme/Allergies: Does not bruise/bleed easily.    Blood pressure 156/86, pulse 112, temperature 98.9 F (37.2 C), temperature source Oral, resp. rate 20, height 6' (1.829 m), weight 127.007 kg (280 lb), SpO2 100 %. Physical Exam  Constitutional: No distress.  Overweight male  HENT:  Head: Normocephalic and atraumatic.  Eyes: EOM are normal. No scleral icterus.  Cardiovascular: Normal rate and regular rhythm.    Respiratory: Effort normal and breath sounds normal.  GI: He exhibits distension. There is tenderness (RLQ). There is guarding (RLQ).  Musculoskeletal: He exhibits no edema.  Neurological: He is alert.  Skin: Skin is warm and dry.  Psychiatric: He has a normal mood and affect. His behavior is normal.     Assessment/Plan 1. Acute appendicitis-Fever and fluid on CT scan concerning for rupture.  2. Poorly controlled diabetes mellitus  Plan: To the operating room for laparoscopic possible open appendectomy.  EDP is going to work on better blood glucose control acutely.  I have discussed the procedure and risks of appendectomy. The risks include but are not limited to bleeding, infection, wound problems, anesthesia, injury to intra-abdominal organs, possibility of postoperative ileus. He seems to understand and agrees with the plan.  Edita Weyenberg J 02/24/2015, 9:09 PM

## 2015-02-24 NOTE — ED Notes (Signed)
Report to  Jen RN

## 2015-02-24 NOTE — ED Notes (Signed)
[facility] has no trucks

## 2015-02-24 NOTE — Anesthesia Procedure Notes (Signed)
Procedure Name: Intubation Date/Time: 02/24/2015 9:35 PM Performed by: Delphia GratesHANDLER, Tamiyah Moulin Pre-anesthesia Checklist: Patient identified, Emergency Drugs available, Suction available and Patient being monitored Patient Re-evaluated:Patient Re-evaluated prior to inductionOxygen Delivery Method: Circle system utilized Preoxygenation: Pre-oxygenation with 100% oxygen Intubation Type: IV induction, Cricoid Pressure applied and Rapid sequence Laryngoscope Size: Mac and 4 Grade View: Grade II Tube type: Oral Tube size: 7.5 mm Number of attempts: 1 Airway Equipment and Method: Stylet Placement Confirmation: ETT inserted through vocal cords under direct vision,  breath sounds checked- equal and bilateral and positive ETCO2 Secured at: 22 cm Tube secured with: Tape Dental Injury: Teeth and Oropharynx as per pre-operative assessment  Comments: Poor dentition

## 2015-02-24 NOTE — ED Notes (Signed)
Wife Tammy can be reached at cell phone [phone]

## 2015-02-24 NOTE — ED Notes (Signed)
Patient transported to CT 

## 2015-02-24 NOTE — Op Note (Signed)
Appendectomy, Lap, Procedure Note  Pre-operative Diagnosis: Acute appendicitis without mention of peritonitis  Post-operative Diagnosis: Perforated acute appendicitis with generalized peritonitis  Procedure:  Laparoscopic appendectomy  Surgeon:  Avel Peace, M.D.  Anesthesia:  General   Indications:  This is a 64 year old male who is had centralized abdominal pain for 8 days. Yesterday it radiated to the right lower quadrant and increased in intensity. The pain was associated with fever as well as nausea and vomiting. CT scan was consistent with acute appendicitis as well as fluid formation in the lower abdomen. He is now brought to the operating room for appendectomy.    Procedure Details   He was brought to the operating room, placed in the supine position and general anesthesia was induced, along with placement of orogastric tube, SCDs, and a Foley catheter. A timeout was performed. The abdomen was prepped and draped in a sterile fashion. A small infraumbilical incision was made through the skin, subcutaneous tissue, fascia, and peritoneum entering the peritoneal cavity under direct vision.  A pursestring suture was passed around the fascia with a 0 Vicryl.  The Hasson was introduced into the peritoneal cavity and the tails of the suture were used to hold the Hasson in place.   The pneumoperitoneum was then established to steady pressure of 15 mmHg.   The laparoscope was introduced and there was no evidence of bleeding or underlying organ injury. Additional 5 mm cannulas then placed in the left lower quadrant of the abdomen and the right upper quadrant region under direct visualization. A careful evaluation of the entire abdomen was carried out.  There was purulent fluid diffusely in the lower abdomen. There were interloop collections of fluid which were exposed and evacuated. The patient was placed in Trendelenburg and left lateral decubitus position. The small intestines were retracted  in the cephalad and left lateral direction away from the pelvis and right lower quadrant. The patient was found to have an enlarged and inflamed appendix that was extending into the pelvis and was perforated at its midportion.  The appendix was carefully mobilized.  The cecum was also mobilized from the lateral abdominal wall to expose it better. The mesoappendix was was divided with the harmonic scalpel.   The appendix was amputated off the cecum, with a small cuff of cecum, using an endo-GIA stapler.  The appendix was placed in a retrieval bag and removed through the subumbilical port incision.    There was no evidence of bleeding, leakage, or complication after division of the appendix. Copious irrigation with 4 liters of sterile fluid was  performed and irrigant fluid suctioned from the abdomen as much as possible.  A #19 Blake drain was then placed into the abdominal cavity through the [facility] trocar. It was brought out the left lower quadrant trocar site. The drain was positioned in the pelvis and up the right gutter. It was anchored to the skin with 3-0 nylon suture.  The umbilical trocar was removed and the  port site fascia was closed via the purse string suture under laparoscopic vision. There was no residual palpable fascial defect.  The remaining trocar wsd removed.  The skin incisions were irrigated and closed with staples. Sterile dressings were applied. The drain was hooked up to bulb suction.  He tolerated the procedure without any apparent complications. He is at high risk for postoperative ileus and recurrent intra-abdominal abscess.  Instrument, sponge, and needle counts were correct at the conclusion of the case.   Findings: The appendix was  found to be inflamed. There were signs of necrosis.  There was perforation. There was abscess formation.  Estimated Blood Loss:  150 ml         Drains: #19 Blake drain                Specimens: appendix         Complications:  None; patient  tolerated the procedure well.         Disposition: PACU - hemodynamically stable.         Condition: stable

## 2015-02-25 ENCOUNTER — Encounter (HOSPITAL_COMMUNITY): Payer: Self-pay | Admitting: General Surgery

## 2015-02-25 LAB — CBC
HEMATOCRIT: 31.7 % — AB (ref 39.0–52.0)
HEMOGLOBIN: 10.9 g/dL — AB (ref 13.0–17.0)
MCH: 29.8 pg (ref 26.0–34.0)
MCHC: 34.4 g/dL (ref 30.0–36.0)
MCV: 86.6 fL (ref 78.0–100.0)
Platelets: 239 10*3/uL (ref 150–400)
RBC: 3.66 MIL/uL — AB (ref 4.22–5.81)
RDW: 12.5 % (ref 11.5–15.5)
WBC: 14.2 10*3/uL — AB (ref 4.0–10.5)

## 2015-02-25 LAB — BASIC METABOLIC PANEL
ANION GAP: 8 (ref 5–15)
BUN: 14 mg/dL (ref 6–20)
CALCIUM: 7.5 mg/dL — AB (ref 8.9–10.3)
CO2: 25 mmol/L (ref 22–32)
Chloride: 102 mmol/L (ref 101–111)
Creatinine, Ser: 1.6 mg/dL — ABNORMAL HIGH (ref 0.61–1.24)
GFR, EST AFRICAN AMERICAN: 52 mL/min — AB (ref >60)
GFR, EST NON AFRICAN AMERICAN: 45 mL/min — AB (ref >60)
Glucose, Bld: 362 mg/dL — ABNORMAL HIGH (ref 65–99)
POTASSIUM: 4.6 mmol/L (ref 3.5–5.1)
SODIUM: 135 mmol/L (ref 135–145)

## 2015-02-25 LAB — GLUCOSE, CAPILLARY
GLUCOSE-CAPILLARY: 228 mg/dL — AB (ref 65–99)
GLUCOSE-CAPILLARY: 287 mg/dL — AB (ref 65–99)
GLUCOSE-CAPILLARY: 241 mg/dL — AB (ref 65–99)
GLUCOSE-CAPILLARY: 249 mg/dL — AB (ref 65–99)
GLUCOSE-CAPILLARY: 207 mg/dL — AB (ref 65–99)
Glucose-Capillary: 357 mg/dL — ABNORMAL HIGH (ref 65–99)

## 2015-02-25 MED ORDER — SODIUM CHLORIDE 0.9 % IV SOLN
INTRAVENOUS | Status: DC
  Administered 2015-02-25 – 2015-03-02 (×12): via INTRAVENOUS

## 2015-02-25 MED ORDER — ONDANSETRON HCL 4 MG PO TABS: 4.0000 mg | ORAL_TABLET | Freq: Four times a day (QID) | ORAL | Status: DC | PRN

## 2015-02-25 MED ORDER — METHOCARBAMOL 1000 MG/10ML IJ SOLN
1000.0000 mg | Freq: Three times a day (TID) | INTRAMUSCULAR | Status: DC | PRN
  Filled 2015-02-25: qty 10

## 2015-02-25 MED ORDER — ENALAPRIL MALEATE 20 MG PO TABS
20.0000 mg | ORAL_TABLET | Freq: Two times a day (BID) | ORAL | Status: DC
  Administered 2015-02-25 – 2015-03-01 (×9): 20 mg via ORAL
  Filled 2015-02-25 (×11): qty 1

## 2015-02-25 MED ORDER — MORPHINE SULFATE 2 MG/ML IJ SOLN
2.0000 mg | INTRAMUSCULAR | Status: DC | PRN
  Administered 2015-02-25: 4 mg via INTRAVENOUS
  Administered 2015-02-25: 2 mg via INTRAVENOUS
  Administered 2015-02-25 (×2): 4 mg via INTRAVENOUS
  Administered 2015-02-26 (×2): 6 mg via INTRAVENOUS
  Administered 2015-02-26: 4 mg via INTRAVENOUS
  Administered 2015-02-26 – 2015-02-27 (×3): 6 mg via INTRAVENOUS
  Administered 2015-02-28: 2 mg via INTRAVENOUS
  Filled 2015-02-25: qty 3
  Filled 2015-02-25: qty 2
  Filled 2015-02-25: qty 3
  Filled 2015-02-25: qty 2
  Filled 2015-02-25: qty 3
  Filled 2015-02-25 (×2): qty 2
  Filled 2015-02-25: qty 1
  Filled 2015-02-25: qty 3
  Filled 2015-02-25: qty 1
  Filled 2015-02-25: qty 3

## 2015-02-25 MED ORDER — HEPARIN SODIUM (PORCINE) 5000 UNIT/ML IJ SOLN
5000.0000 [IU] | Freq: Three times a day (TID) | INTRAMUSCULAR | Status: DC
  Administered 2015-02-25 – 2015-03-05 (×24): 5000 [IU] via SUBCUTANEOUS
  Filled 2015-02-25 (×27): qty 1

## 2015-02-25 MED ORDER — AMLODIPINE BESYLATE 10 MG PO TABS
10.0000 mg | ORAL_TABLET | Freq: Every day | ORAL | Status: DC
  Administered 2015-02-25 – 2015-03-01 (×5): 10 mg via ORAL
  Filled 2015-02-25 (×5): qty 1

## 2015-02-25 MED ORDER — CLONIDINE HCL 0.2 MG PO TABS
0.2000 mg | ORAL_TABLET | Freq: Three times a day (TID) | ORAL | Status: DC
  Administered 2015-02-25 – 2015-03-01 (×14): 0.2 mg via ORAL
  Filled 2015-02-25 (×15): qty 1

## 2015-02-25 MED ORDER — CETYLPYRIDINIUM CHLORIDE 0.05 % MT LIQD
7.0000 mL | Freq: Two times a day (BID) | OROMUCOSAL | Status: DC
  Administered 2015-02-25 – 2015-03-04 (×12): 7 mL via OROMUCOSAL

## 2015-02-25 MED ORDER — PIPERACILLIN-TAZOBACTAM 3.375 G IVPB
3.3750 g | Freq: Three times a day (TID) | INTRAVENOUS | Status: DC
  Administered 2015-02-25 – 2015-03-04 (×23): 3.375 g via INTRAVENOUS
  Filled 2015-02-25 (×24): qty 50

## 2015-02-25 MED ORDER — ONDANSETRON HCL 4 MG/2ML IJ SOLN
4.0000 mg | INTRAMUSCULAR | Status: DC | PRN
  Administered 2015-02-25 – 2015-03-03 (×9): 4 mg via INTRAVENOUS
  Filled 2015-02-25 (×9): qty 2

## 2015-02-25 MED ORDER — METOPROLOL SUCCINATE ER 25 MG PO TB24
25.0000 mg | ORAL_TABLET | Freq: Two times a day (BID) | ORAL | Status: DC
  Administered 2015-02-25 – 2015-02-26 (×5): 25 mg via ORAL
  Filled 2015-02-25 (×7): qty 1

## 2015-02-25 MED ORDER — INSULIN ASPART 100 UNIT/ML ~~LOC~~ SOLN
0.0000 [IU] | SUBCUTANEOUS | Status: DC
  Administered 2015-02-25: 11 [IU] via SUBCUTANEOUS
  Administered 2015-02-25 (×3): 7 [IU] via SUBCUTANEOUS
  Administered 2015-02-25: 15 [IU] via SUBCUTANEOUS
  Administered 2015-02-25: 7 [IU] via SUBCUTANEOUS
  Administered 2015-02-26 (×2): 4 [IU] via SUBCUTANEOUS
  Administered 2015-02-26: 7 [IU] via SUBCUTANEOUS
  Administered 2015-02-26 (×2): 4 [IU] via SUBCUTANEOUS
  Administered 2015-02-26: 7 [IU] via SUBCUTANEOUS
  Administered 2015-02-26 – 2015-02-27 (×5): 4 [IU] via SUBCUTANEOUS
  Administered 2015-02-27: 7 [IU] via SUBCUTANEOUS
  Administered 2015-02-28 (×3): 4 [IU] via SUBCUTANEOUS

## 2015-02-25 NOTE — Anesthesia Postprocedure Evaluation (Signed)
  Anesthesia Post-op Note  Patient: Richard Russell  Procedure(s) Performed: Procedure(s) (LRB): APPENDECTOMY LAPAROSCOPIC (N/A)  Patient Location: PACU  Anesthesia Type: General  Level of Consciousness: awake and alert   Airway and Oxygen Therapy: Patient Spontanous Breathing  Post-op Pain: mild  Post-op Assessment: Post-op Vital signs reviewed, Patient's Cardiovascular Status Stable, Respiratory Function Stable, Patent Airway and No signs of Nausea or vomiting  Last Vitals:  Filed Vitals:   02/25/15 0008  BP: 172/104  Pulse: 88  Temp: 37.7 C  Resp: 18    Post-op Vital Signs: stable   Complications: No apparent anesthesia complications

## 2015-02-25 NOTE — Progress Notes (Addendum)
Inpatient Diabetes Program Recommendations  AACE/ADA: New Consensus Statement on Inpatient Glycemic Control (2013)  Target Ranges:  Prepandial:   less than 140 mg/dL      Peak postprandial:   less than 180 mg/dL (1-2 hours)      Critically ill patients:  140 - 180 mg/dL    Results for Aaron MoseMARTIN, Saurav E (MRN [id number]) as of 02/25/2015 14:32  Ref. Range 02/24/2015 23:13 02/25/2015 00:07 02/25/2015 04:49 02/25/2015 07:53 02/25/2015 12:18  Glucose-Capillary Latest Ref Range: 65-99 mg/dL 409368 (H) 811357 (H) 914228 (H) 287 (H) 241 (H)    Admit with: Acute Appendicitis  History: DM, HTN  Home DM Meds: Lantus 5 units bid + Novolog (patient unclear on exact doses)  Current DM Orders: Novolog Resistant SSI (0-20 units) Q4 hours    -Attempted to speak with patient to clarify home insulin doses.  Patient stated to me he takes Lantus 5 units in the AM and another 5 units Lantus at bedtime.  Patient also stated he takes 4.6 units Novolog with lunch.  I questioned patient as to how he measures 4.6 units of Novolog in an insulin syringe.  Patient told me his doctor just increased his Novolog from 4.2 units to 4.6 units.  I again attempted clarification, but am not sure what patient meant by 4.6 units.  -Attempted to call [facility].  The [facility] would NOT give me any information over the telephone about this patient's insulin doses.  I was transferred from department to department and was given no answers as requested.  -I assume patient takes Novolog at home, however, I am completely unsure of exactly how much he is taking.  Of note, patient's home med rec states patient take Lantus 5 units tidwc but patient told me he is taking Lantus 5 units bid which is much more believable.    -Have placed a page to Jorje GuildMegan Baird, [place] with [facility] to see if they would like to start Lantus on this patient tonight.     MD- Please consider starting Lantus 5 units bid     Will  follow Ambrose FinlandJeannine Johnston Davaughn Hillyard RN, MSN, CDE Diabetes Coordinator Inpatient Glycemic Control Team Team Pager: [phone] (8a-5p)

## 2015-02-25 NOTE — Progress Notes (Signed)
[facility] Progress Note  1 Day Post-Op  Subjective: Pt in quite a bit of pain, no N/V.  Very thirsty.  No flatus or BM yet.  Abdomen distended.  No other complaints.  Not been OOB yet.  Using IS up to 500.  Objective: Vital signs in last 24 hours: Temp:  [98.9 F (37.2 C)-101 F (38.3 C)] 99.7 F (37.6 C) (07/06 0534) Pulse Rate:  [86-112] 96 (07/06 0534) Resp:  [15-27] 18 (07/06 0534) BP: (139-190)/(63-114) 151/65 mmHg (07/06 0534) SpO2:  [84 %-100 %] 96 % (07/06 0534) Weight:  [127.007 kg (280 lb)] 127.007 kg (280 lb) (07/05 1524) Last BM Date: 02/24/15  Intake/Output from previous day: 07/05 0701 - 07/06 0700 In: 2975 [I.V.:2925; IV Piggyback:50] Out: 1610 [Urine:1575; Drains:373] Intake/Output this shift:    PE: Gen:  Alert, NAD, pleasant Pulm:  IS 500 Abd: Soft, distended, tender throughout, +BS, no HSM, incisions C/D/I, drain with serosanguinous drainage   Lab Results:   Recent Labs  02/24/15 1530 02/25/15 0440  WBC 16.5* 14.2*  HGB 13.6 10.9*  HCT 38.0* 31.7*  PLT 283 239   BMET  Recent Labs  02/24/15 1530 02/25/15 0440  NA 130* 135  K 4.7 4.6  CL 95* 102  CO2 22 25  GLUCOSE 536* 362*  BUN 18 14  CREATININE 1.83* 1.60*  CALCIUM 8.7* 7.5*   PT/INR No results for input(s): LABPROT, INR in the last 72 hours. CMP     Component Value Date/Time   NA 135 02/25/2015 0440   K 4.6 02/25/2015 0440   CL 102 02/25/2015 0440   CO2 25 02/25/2015 0440   GLUCOSE 362* 02/25/2015 0440   BUN 14 02/25/2015 0440   CREATININE 1.60* 02/25/2015 0440   CALCIUM 7.5* 02/25/2015 0440   PROT 7.8 02/24/2015 1530   ALBUMIN 3.7 02/24/2015 1530   AST 21 02/24/2015 1530   ALT 15* 02/24/2015 1530   ALKPHOS 70 02/24/2015 1530   BILITOT 1.7* 02/24/2015 1530   GFRNONAA 45* 02/25/2015 0440   GFRAA 52* 02/25/2015 0440   Lipase     Component Value Date/Time   LIPASE 18* 02/24/2015 1530       Studies/Results: Dg Chest 1 View  02/24/2015   CLINICAL  DATA:  Preop chest examination  EXAM: CHEST  1 VIEW  COMPARISON:  07/19/2013  FINDINGS: Cardiomediastinal silhouette is unremarkable. Mild elevation of the right hemidiaphragm. No acute infiltrate or pleural effusion. No pulmonary edema. Degenerative changes right AC joint and distal clavicle.  IMPRESSION: No active disease.   Electronically Signed   By: Natasha Mead M.D.   On: 02/24/2015 18:07   Ct Abdomen Pelvis W Contrast  02/24/2015   CLINICAL DATA:  Patient with right lower quadrant pain for 1 week. Nausea and vomiting.  EXAM: CT ABDOMEN AND PELVIS WITH CONTRAST  TECHNIQUE: Multidetector CT imaging of the abdomen and pelvis was performed using the standard protocol following bolus administration of intravenous contrast.  CONTRAST:  80 mL Omnipaque 300  COMPARISON:  None.  FINDINGS: Lower chest: Bilateral gynecomastia. Small hiatal hernia. Dependent atelectasis bilateral lower lobes.  Hepatobiliary: Liver is normal in size and contour without focal hepatic lesion identified. Gallbladder is unremarkable. No intrahepatic or extrahepatic biliary ductal dilatation.  Pancreas: Unremarkable  Spleen: Unremarkable  Adrenals/Urinary Tract: Adrenal glands are normal. There is a 2 cm cyst within the interpolar region of the left kidney. Kidneys enhance symmetrically with contrast. Nonspecific perinephric fat stranding. No hydronephrosis. There is mild dilatation of the  mid right ureter at the level of right lower quadrant inflammatory change.  Stomach/Bowel: The appendix is dilated measuring up to 18 mm with extensive periappendiceal fat stranding. Additionally there is a small amount of fluid within the right lower quadrant extending along the right paracolic gutter. Small amount of fluid within the mesentery. No evidence for bowel obstruction. No free intraperitoneal air. Circumferential wall thickening of the distal ileum likely reactive from adjacent inflammation involving the appendix.  Vascular/Lymphatic: Normal  caliber abdominal aorta. No retroperitoneal lymphadenopathy.  Other: There is a moderate-sized right inguinal hernia predominantly containing fat. There is associated soft tissue which is nonspecific however may represent complicated fluid.  Musculoskeletal: Lower lumbar spine degenerative changes. No aggressive or acute appearing osseous lesions.  IMPRESSION: Findings compatible with acute appendicitis. There is surrounding fluid within the right lower quadrant and paracolic gutter.  Critical Value/emergent results were called by telephone at the time of interpretation on 02/24/2015 at 5:11 pm to Dr. Mirian MoMATTHEW GENTRY , who verbally acknowledged these results.   Electronically Signed   By: Annia Beltrew  Davis M.D.   On: 02/24/2015 17:11    Anti-infectives: Anti-infectives    Start     Dose/Rate Route Frequency Ordered Stop   02/25/15 0200  piperacillin-tazobactam (ZOSYN) IVPB 3.375 g     3.375 g 12.5 mL/hr over 240 Minutes Intravenous Every 8 hours 02/25/15 0010     02/24/15 1745  piperacillin-tazobactam (ZOSYN) IVPB 3.375 g     3.375 g 100 mL/hr over 30 Minutes Intravenous  Once 02/24/15 1734 02/24/15 1817       Assessment/Plan Perforated acute appendicitis with generalized peritonitis POD #1 s/p lap appy Post-operative ileus -NPO, sips with meds/ice, await bowel function -Ambulate and IS -SCD's and SQ heparin -IVF, pain control, antiemetics  -IV Antibiotics (Zosyn Day #2)    LOS: 1 day    Nonie HoyerMegan N Alaisa Moffitt 02/25/2015, 7:34 AM Pager: [phone]

## 2015-02-26 DIAGNOSIS — I10 Essential (primary) hypertension: Secondary | ICD-10-CM

## 2015-02-26 DIAGNOSIS — N289 Disorder of kidney and ureter, unspecified: Secondary | ICD-10-CM

## 2015-02-26 DIAGNOSIS — E119 Type 2 diabetes mellitus without complications: Secondary | ICD-10-CM

## 2015-02-26 LAB — BASIC METABOLIC PANEL
ANION GAP: 7 (ref 5–15)
BUN: 13 mg/dL (ref 6–20)
CHLORIDE: 105 mmol/L (ref 101–111)
CO2: 25 mmol/L (ref 22–32)
Calcium: 7.5 mg/dL — ABNORMAL LOW (ref 8.9–10.3)
Creatinine, Ser: 1.5 mg/dL — ABNORMAL HIGH (ref 0.61–1.24)
GFR calc non Af Amer: 49 mL/min — ABNORMAL LOW (ref >60)
GFR, EST AFRICAN AMERICAN: 56 mL/min — AB (ref >60)
Glucose, Bld: 208 mg/dL — ABNORMAL HIGH (ref 65–99)
POTASSIUM: 3.9 mmol/L (ref 3.5–5.1)
Sodium: 137 mmol/L (ref 135–145)

## 2015-02-26 LAB — CBC
HEMATOCRIT: 30.4 % — AB (ref 39.0–52.0)
HEMOGLOBIN: 10.6 g/dL — AB (ref 13.0–17.0)
MCH: 30.7 pg (ref 26.0–34.0)
MCHC: 34.9 g/dL (ref 30.0–36.0)
MCV: 88.1 fL (ref 78.0–100.0)
Platelets: 249 10*3/uL (ref 150–400)
RBC: 3.45 MIL/uL — ABNORMAL LOW (ref 4.22–5.81)
RDW: 12.9 % (ref 11.5–15.5)
WBC: 11.6 10*3/uL — ABNORMAL HIGH (ref 4.0–10.5)

## 2015-02-26 LAB — GLUCOSE, CAPILLARY
Glucose-Capillary: 173 mg/dL — ABNORMAL HIGH (ref 65–99)
Glucose-Capillary: 179 mg/dL — ABNORMAL HIGH (ref 65–99)
Glucose-Capillary: 188 mg/dL — ABNORMAL HIGH (ref 65–99)
Glucose-Capillary: 195 mg/dL — ABNORMAL HIGH (ref 65–99)
Glucose-Capillary: 196 mg/dL — ABNORMAL HIGH (ref 65–99)
Glucose-Capillary: 209 mg/dL — ABNORMAL HIGH (ref 65–99)
Glucose-Capillary: 214 mg/dL — ABNORMAL HIGH (ref 65–99)

## 2015-02-26 MED ORDER — HYDRALAZINE HCL 20 MG/ML IJ SOLN
5.0000 mg | INTRAMUSCULAR | Status: DC | PRN
  Administered 2015-02-28 (×2): 5 mg via INTRAVENOUS
  Filled 2015-02-26 (×2): qty 1

## 2015-02-26 MED ORDER — INSULIN GLARGINE 100 UNIT/ML ~~LOC~~ SOLN
5.0000 [IU] | Freq: Every day | SUBCUTANEOUS | Status: DC
  Administered 2015-02-26: 5 [IU] via SUBCUTANEOUS
  Filled 2015-02-26 (×3): qty 0.05

## 2015-02-26 NOTE — Progress Notes (Signed)
Notified Mr. Hoxworth because of Low BP and several antihypertensive.  Dr. Johna SheriffHoxworth ok'd metoprolol and catapress and hold Enalapril tonight.

## 2015-02-26 NOTE — Consult Note (Signed)
[facility] Medical Consultation  Richard Russell VHQ:[id number] DOB: 07/16/1951 DOA: 02/24/2015 PCP: No primary care provider on file.   Requesting physician: Dr. Abbey Chatters Date of consultation: 02/26/15 Reason for consultation: Medical management - HTN, DM  Impression/Recommendations Active Problems:   Perforated appendicitis   DM2 (diabetes mellitus, type 2)   HTN (hypertension)   1. DM2 1. Pt currently remains NPO 2. Per med rec, was on 4.6 units aspart bid and 5 units lantus tid (pt reports BID lantus) prior to admit 3. Presenting glucose near 500's, currently in the 170's. Will check a1c 4. Agree with SSI coverage. 5. Will cont on 5units lantus daily while NPO 6. Once pt starts tolerating PO reliably and pending a1c, would resume home dosed lantus and titrate accordingly 2. HTN 1. BP stable thus far, albeit suboptimally controlled 2. Presently on norvasc , clonidine 0.2mg  tid, enalapril , metoprolol  qday 3. Renal insufficiency, unclear if acute or chronic 1. Diuretic and spironolactone from home meds on hold 2. Improving 3. Would cont to monitor closely 4. Perforated appendicitis 1. Pt is s/p surgery 7/5 2. Cont as per primary service  Will followup again tomorrow. Please contact me if I can be of assistance in the meanwhile. Thank you for this consultation.   HPI:  64yo with hx of HTN, DM who was initially transferred to [facility] to surgical service secondary to acute appendicitis. On presentation, the patient was noted to have glucose into the upper 400's. The patient was taken to the OR on 7/5 for appendectomy. Post-operatively, the patient was noted to have labile blood pressures. Given hx of poorly controlled diabetes and HTN, hospitalist service was consulted for assistance with medical management.  Review of Systems:  Review of Systems  Constitutional: Negative for fever and chills.  HENT: Negative for tinnitus.   Eyes: Negative for double vision and  pain.  Respiratory: Negative for hemoptysis, shortness of breath and stridor.   Cardiovascular: Negative for palpitations and claudication.  Gastrointestinal: Positive for abdominal pain.       Bloating  Genitourinary: Negative for hematuria.  Musculoskeletal: Negative for joint pain and falls.  Neurological: Negative for tremors, seizures and loss of consciousness.  Psychiatric/Behavioral: Negative for memory loss and substance abuse.     Past Medical History  Diagnosis Date  . Diabetes mellitus without complication   . Hypertension   . Hypercholesteremia    Past Surgical History  Procedure Laterality Date  . Back surgery    . Laparoscopic appendectomy N/A 02/24/2015    Procedure: APPENDECTOMY LAPAROSCOPIC;  Surgeon: Avel Peace, MD;  Location: [facility] ORS;  Service: General;  Laterality: N/A;   Social History:  reports that he has never smoked. He does not have any smokeless tobacco history on file. He reports that he does not drink alcohol or use illicit drugs.  No Known Allergies No family history on file.  Prior to Admission medications   Medication Sig Start Date End Date Taking? Authorizing Provider  amLODipine (NORVASC) 10 MG tablet Take 10 mg by mouth daily.   Yes Historical Provider, MD  atorvastatin (LIPITOR) 80 MG tablet Take 40 mg by mouth at bedtime.   Yes Historical Provider, MD  chlorthalidone (HYGROTON) 25 MG tablet Take 12.5 mg by mouth daily.   Yes Historical Provider, MD  cloNIDine (CATAPRES) 0.2 MG tablet Take 0.2 mg by mouth 3 (three) times daily.   Yes Historical Provider, MD  enalapril (VASOTEC) 20 MG tablet Take 20 mg by mouth 2 (two) times daily.  Yes Historical Provider, MD  gabapentin (NEURONTIN) 800 MG tablet Take 800 mg by mouth 3 (three) times daily.   Yes Historical Provider, MD  insulin aspart (NOVOLOG FLEXPEN) 100 UNIT/ML FlexPen Inject 4.6 Units into the skin 2 (two) times daily.   Yes Historical Provider, MD  Insulin Glargine (LANTUS SOLOSTAR)  100 UNIT/ML Solostar Pen Inject 5 Units into the skin 3 (three) times daily.   Yes Historical Provider, MD  magnesium hydroxide (MILK OF MAGNESIA) 400 MG/5ML suspension Take 30 mLs by mouth daily as needed for mild constipation.   Yes Historical Provider, MD  metoprolol succinate (TOPROL-XL) 50 MG 24 hr tablet Take 25 mg by mouth 2 (two) times daily. Take with or immediately following a meal.   Yes Historical Provider, MD  spironolactone (ALDACTONE) 25 MG tablet Take 50 mg by mouth daily.   Yes Historical Provider, MD   Physical Exam: Blood pressure 142/64, pulse 81, temperature 100.2 F (37.9 C), temperature source Oral, resp. rate 18, height 6' (1.829 m), weight 127.007 kg (280 lb), SpO2 95 %. Filed Vitals:   02/25/15 1352 02/25/15 2115 02/26/15 0133 02/26/15 0532  BP: 148/78 125/61 151/65 142/64  Pulse: 80 78 88 81  Temp: 97.7 F (36.5 C) 99.3 F (37.4 C) 99.9 F (37.7 C) 100.2 F (37.9 C)  TempSrc: Oral Oral Oral Oral  Resp: 18 18 18 18   Height:      Weight:      SpO2: 98% 93% 94% 95%     General:  Awake, in nad  Eyes: PERRL B  ENT: membranes moist, dentition fair  Neck: trachea midline,neck supple  Cardiovascular: regular, s1, s2  Respiratory: normal resp effort, no wheezing  Abdomen: soft, mildly distended, decreased BS  Skin: normal skin turgor, no abnormal skin lesions seen  Musculoskeletal: perfused,no clubbing  Psychiatric: mood/affect normal// no auditory/visual halucinations  Neurologic: cn2-12 grossly intact, strength/sensation intact  Labs on Admission:  Basic Metabolic Panel:  Recent Labs Lab 02/24/15 1530 02/25/15 0440 02/26/15 0409  NA 130* 135 137  K 4.7 4.6 3.9  CL 95* 102 105  CO2 22 25 25   GLUCOSE 536* 362* 208*  BUN 18 14 13   CREATININE 1.83* 1.60* 1.50*  CALCIUM 8.7* 7.5* 7.5*   Liver Function Tests:  Recent Labs Lab 02/24/15 1530  AST 21  ALT 15*  ALKPHOS 70  BILITOT 1.7*  PROT 7.8  ALBUMIN 3.7    Recent Labs Lab  02/24/15 1530  LIPASE 18*   No results for input(s): AMMONIA in the last 168 hours. CBC:  Recent Labs Lab 02/24/15 1530 02/25/15 0440 02/26/15 0409  WBC 16.5* 14.2* 11.6*  NEUTROABS 14.8*  --   --   HGB 13.6 10.9* 10.6*  HCT 38.0* 31.7* 30.4*  MCV 86.2 86.6 88.1  PLT 283 239 249   Cardiac Enzymes: No results for input(s): CKTOTAL, CKMB, CKMBINDEX, TROPONINI in the last 168 hours. BNP: Invalid input(s): POCBNP CBG:  Recent Labs Lab 02/25/15 2007 02/25/15 2340 02/26/15 0424 02/26/15 0812 02/26/15 1220  GLUCAP 207* 188* 179* 173* 214*    Radiological Exams on Admission: Dg Chest 1 View  02/24/2015   CLINICAL DATA:  Preop chest examination  EXAM: CHEST  1 VIEW  COMPARISON:  07/19/2013  FINDINGS: Cardiomediastinal silhouette is unremarkable. Mild elevation of the right hemidiaphragm. No acute infiltrate or pleural effusion. No pulmonary edema. Degenerative changes right AC joint and distal clavicle.  IMPRESSION: No active disease.   Electronically Signed   By: Lanette Hampshire.D.  On: 02/24/2015 18:07   Ct Abdomen Pelvis W Contrast  02/24/2015   CLINICAL DATA:  Patient with right lower quadrant pain for 1 week. Nausea and vomiting.  EXAM: CT ABDOMEN AND PELVIS WITH CONTRAST  TECHNIQUE: Multidetector CT imaging of the abdomen and pelvis was performed using the standard protocol following bolus administration of intravenous contrast.  CONTRAST:  80 mL Omnipaque 300  COMPARISON:  None.  FINDINGS: Lower chest: Bilateral gynecomastia. Small hiatal hernia. Dependent atelectasis bilateral lower lobes.  Hepatobiliary: Liver is normal in size and contour without focal hepatic lesion identified. Gallbladder is unremarkable. No intrahepatic or extrahepatic biliary ductal dilatation.  Pancreas: Unremarkable  Spleen: Unremarkable  Adrenals/Urinary Tract: Adrenal glands are normal. There is a 2 cm cyst within the interpolar region of the left kidney. Kidneys enhance symmetrically with contrast.  Nonspecific perinephric fat stranding. No hydronephrosis. There is mild dilatation of the mid right ureter at the level of right lower quadrant inflammatory change.  Stomach/Bowel: The appendix is dilated measuring up to 18 mm with extensive periappendiceal fat stranding. Additionally there is a small amount of fluid within the right lower quadrant extending along the right paracolic gutter. Small amount of fluid within the mesentery. No evidence for bowel obstruction. No free intraperitoneal air. Circumferential wall thickening of the distal ileum likely reactive from adjacent inflammation involving the appendix.  Vascular/Lymphatic: Normal caliber abdominal aorta. No retroperitoneal lymphadenopathy.  Other: There is a moderate-sized right inguinal hernia predominantly containing fat. There is associated soft tissue which is nonspecific however may represent complicated fluid.  Musculoskeletal: Lower lumbar spine degenerative changes. No aggressive or acute appearing osseous lesions.  IMPRESSION: Findings compatible with acute appendicitis. There is surrounding fluid within the right lower quadrant and paracolic gutter.  Critical Value/emergent results were called by telephone at the time of interpretation on 02/24/2015 at 5:11 pm to Dr. Mirian MoMATTHEW GENTRY , who verbally acknowledged these results.   Electronically Signed   By: Annia Beltrew  Davis M.D.   On: 02/24/2015 17:11    CHIU, Scheryl MartenSTEPHEN K [facility] Pager [phone]  If 7PM-7AM, please contact night-coverage [email or web address] Password [email or web address] 02/26/2015, 2:44 PM

## 2015-02-26 NOTE — Progress Notes (Signed)
[facility] Progress Note  2 Days Post-Op  Subjective: Pt still uncomfortable, still bloated.  No flatus or BM yet.  Not ambulating much.  Using IS up to 650.  Very thirsty/hungry.  Objective: Vital signs in last 24 hours: Temp:  [97.7 F (36.5 C)-100.2 F (37.9 C)] 100.2 F (37.9 C) (07/07 0532) Pulse Rate:  [78-89] 81 (07/07 0532) Resp:  [18] 18 (07/07 0532) BP: (125-152)/(61-78) 142/64 mmHg (07/07 0532) SpO2:  [93 %-98 %] 95 % (07/07 0532) Last BM Date: 02/24/15  Intake/Output from previous day: 07/06 0701 - 07/07 0700 In: 2600 [I.V.:2500; IV Piggyback:100] Out: 730 [Urine:700; Drains:30] Intake/Output this shift:    PE: Gen:  Alert, NAD, pleasant Card:  RRR, no M/G/R heard Pulm:  CTA, no W/R/R, is to 650. Abd: Soft, distended, tender throughout, +BS, no HSM, incisions C/D/I, drain with serosanguinous drainage (68mL/24hr)  Lab Results:   Recent Labs  02/25/15 0440 02/26/15 0409  WBC 14.2* 11.6*  HGB 10.9* 10.6*  HCT 31.7* 30.4*  PLT 239 249   BMET  Recent Labs  02/25/15 0440 02/26/15 0409  NA 135 137  K 4.6 3.9  CL 102 105  CO2 25 25  GLUCOSE 362* 208*  BUN 14 13  CREATININE 1.60* 1.50*  CALCIUM 7.5* 7.5*   PT/INR No results for input(s): LABPROT, INR in the last 72 hours. CMP     Component Value Date/Time   NA 137 02/26/2015 0409   K 3.9 02/26/2015 0409   CL 105 02/26/2015 0409   CO2 25 02/26/2015 0409   GLUCOSE 208* 02/26/2015 0409   BUN 13 02/26/2015 0409   CREATININE 1.50* 02/26/2015 0409   CALCIUM 7.5* 02/26/2015 0409   PROT 7.8 02/24/2015 1530   ALBUMIN 3.7 02/24/2015 1530   AST 21 02/24/2015 1530   ALT 15* 02/24/2015 1530   ALKPHOS 70 02/24/2015 1530   BILITOT 1.7* 02/24/2015 1530   GFRNONAA 49* 02/26/2015 0409   GFRAA 56* 02/26/2015 0409   Lipase     Component Value Date/Time   LIPASE 18* 02/24/2015 1530       Studies/Results: Dg Chest 1 View  02/24/2015   CLINICAL DATA:  Preop chest examination  EXAM:  CHEST  1 VIEW  COMPARISON:  07/19/2013  FINDINGS: Cardiomediastinal silhouette is unremarkable. Mild elevation of the right hemidiaphragm. No acute infiltrate or pleural effusion. No pulmonary edema. Degenerative changes right AC joint and distal clavicle.  IMPRESSION: No active disease.   Electronically Signed   By: Natasha Mead M.D.   On: 02/24/2015 18:07   Ct Abdomen Pelvis W Contrast  02/24/2015   CLINICAL DATA:  Patient with right lower quadrant pain for 1 week. Nausea and vomiting.  EXAM: CT ABDOMEN AND PELVIS WITH CONTRAST  TECHNIQUE: Multidetector CT imaging of the abdomen and pelvis was performed using the standard protocol following bolus administration of intravenous contrast.  CONTRAST:  80 mL Omnipaque 300  COMPARISON:  None.  FINDINGS: Lower chest: Bilateral gynecomastia. Small hiatal hernia. Dependent atelectasis bilateral lower lobes.  Hepatobiliary: Liver is normal in size and contour without focal hepatic lesion identified. Gallbladder is unremarkable. No intrahepatic or extrahepatic biliary ductal dilatation.  Pancreas: Unremarkable  Spleen: Unremarkable  Adrenals/Urinary Tract: Adrenal glands are normal. There is a 2 cm cyst within the interpolar region of the left kidney. Kidneys enhance symmetrically with contrast. Nonspecific perinephric fat stranding. No hydronephrosis. There is mild dilatation of the mid right ureter at the level of right lower quadrant inflammatory change.  Stomach/Bowel:  The appendix is dilated measuring up to 18 mm with extensive periappendiceal fat stranding. Additionally there is a small amount of fluid within the right lower quadrant extending along the right paracolic gutter. Small amount of fluid within the mesentery. No evidence for bowel obstruction. No free intraperitoneal air. Circumferential wall thickening of the distal ileum likely reactive from adjacent inflammation involving the appendix.  Vascular/Lymphatic: Normal caliber abdominal aorta. No  retroperitoneal lymphadenopathy.  Other: There is a moderate-sized right inguinal hernia predominantly containing fat. There is associated soft tissue which is nonspecific however may represent complicated fluid.  Musculoskeletal: Lower lumbar spine degenerative changes. No aggressive or acute appearing osseous lesions.  IMPRESSION: Findings compatible with acute appendicitis. There is surrounding fluid within the right lower quadrant and paracolic gutter.  Critical Value/emergent results were called by telephone at the time of interpretation on 02/24/2015 at 5:11 pm to Dr. Mirian MoMATTHEW GENTRY , who verbally acknowledged these results.   Electronically Signed   By: Annia Beltrew  Davis M.D.   On: 02/24/2015 17:11    Anti-infectives: Anti-infectives    Start     Dose/Rate Route Frequency Ordered Stop   02/25/15 0200  piperacillin-tazobactam (ZOSYN) IVPB 3.375 g     3.375 g 12.5 mL/hr over 240 Minutes Intravenous Every 8 hours 02/25/15 0010     02/24/15 1745  piperacillin-tazobactam (ZOSYN) IVPB 3.375 g     3.375 g 100 mL/hr over 30 Minutes Intravenous  Once 02/24/15 1734 02/24/15 1817       Assessment/Plan Perforated acute appendicitis with generalized peritonitis POD #2 s/p lap appy Post-operative ileus -NPO, sips with meds/ice, await bowel function -Ambulate and IS -SCD's and SQ heparin -IVF, pain control, antiemetics  -IV Antibiotics (Zosyn Day #2)  Diabetes Mellitus Type 2 -CBG's were >500 on admission -Dr. Rhona Leavenshiu to see and give recommendations  Hypertension -Appreciate medicines help    LOS: 2 days    Nonie HoyerMegan N Kypton [place]ringham 02/26/2015, 7:52 AM Pager: [phone]

## 2015-02-27 LAB — CBC
HCT: 30.3 % — ABNORMAL LOW (ref 39.0–52.0)
Hemoglobin: 10.1 g/dL — ABNORMAL LOW (ref 13.0–17.0)
MCH: 29.4 pg (ref 26.0–34.0)
MCHC: 33.3 g/dL (ref 30.0–36.0)
MCV: 88.1 fL (ref 78.0–100.0)
PLATELETS: 299 10*3/uL (ref 150–400)
RBC: 3.44 MIL/uL — ABNORMAL LOW (ref 4.22–5.81)
RDW: 13 % (ref 11.5–15.5)
WBC: 10.7 10*3/uL — ABNORMAL HIGH (ref 4.0–10.5)

## 2015-02-27 LAB — GLUCOSE, CAPILLARY
Glucose-Capillary: 170 mg/dL — ABNORMAL HIGH (ref 65–99)
Glucose-Capillary: 163 mg/dL — ABNORMAL HIGH (ref 65–99)
Glucose-Capillary: 160 mg/dL — ABNORMAL HIGH (ref 65–99)
Glucose-Capillary: 179 mg/dL — ABNORMAL HIGH (ref 65–99)
Glucose-Capillary: 209 mg/dL — ABNORMAL HIGH (ref 65–99)

## 2015-02-27 LAB — BASIC METABOLIC PANEL
ANION GAP: 6 (ref 5–15)
BUN: 13 mg/dL (ref 6–20)
CO2: 24 mmol/L (ref 22–32)
CREATININE: 1.36 mg/dL — AB (ref 0.61–1.24)
Calcium: 7.6 mg/dL — ABNORMAL LOW (ref 8.9–10.3)
Chloride: 109 mmol/L (ref 101–111)
GFR calc non Af Amer: 55 mL/min — ABNORMAL LOW (ref >60)
Glucose, Bld: 178 mg/dL — ABNORMAL HIGH (ref 65–99)
Potassium: 3.9 mmol/L (ref 3.5–5.1)
Sodium: 139 mmol/L (ref 135–145)

## 2015-02-27 LAB — HEMOGLOBIN A1C
Hgb A1c MFr Bld: 9.3 % — ABNORMAL HIGH (ref 4.8–5.6)
MEAN PLASMA GLUCOSE: 220 mg/dL

## 2015-02-27 MED ORDER — METOPROLOL SUCCINATE ER 50 MG PO TB24: 50.0000 mg | ORAL_TABLET | Freq: Two times a day (BID) | ORAL | Status: DC

## 2015-02-27 MED ORDER — INSULIN GLARGINE 100 UNIT/ML ~~LOC~~ SOLN
5.0000 [IU] | Freq: Two times a day (BID) | SUBCUTANEOUS | Status: DC
  Administered 2015-02-27: 5 [IU] via SUBCUTANEOUS
  Filled 2015-02-27 (×2): qty 0.05

## 2015-02-27 MED ORDER — METOPROLOL SUCCINATE ER 50 MG PO TB24
50.0000 mg | ORAL_TABLET | Freq: Two times a day (BID) | ORAL | Status: DC
  Administered 2015-02-27 – 2015-03-01 (×5): 50 mg via ORAL
  Filled 2015-02-27 (×6): qty 1

## 2015-02-27 MED ORDER — BISACODYL 10 MG RE SUPP
10.0000 mg | Freq: Two times a day (BID) | RECTAL | Status: AC
  Administered 2015-02-27: 10 mg via RECTAL
  Filled 2015-02-27 (×2): qty 1

## 2015-02-27 MED ORDER — SPIRONOLACTONE 50 MG PO TABS
50.0000 mg | ORAL_TABLET | Freq: Every day | ORAL | Status: DC
  Administered 2015-02-27 – 2015-03-01 (×3): 50 mg via ORAL
  Filled 2015-02-27 (×4): qty 1

## 2015-02-27 MED ORDER — INSULIN GLARGINE 100 UNIT/ML ~~LOC~~ SOLN
7.0000 [IU] | Freq: Two times a day (BID) | SUBCUTANEOUS | Status: DC
  Administered 2015-02-27 – 2015-02-28 (×2): 7 [IU] via SUBCUTANEOUS
  Filled 2015-02-27 (×2): qty 0.07

## 2015-02-27 NOTE — Progress Notes (Signed)
Patient ID: Richard Russell, male   DOB: [birth year], 64 y.o.   MRN: [id number]     [facility]      [address] [place], [place] [address]    Phone: [phone] FAX: [phone]     Subjective: No flatus.  Some nausea, no vomiting.  23m from drain, serous. Good uop.  Walking.    Objective:  Vital signs:  Filed Vitals:   02/26/15 1400 02/26/15 2136 02/27/15 0501 02/27/15 0922  BP: 158/67 174/73 155/64 159/79  Pulse: 84 91 77   Temp: 98.6 F (37 C) 99 F (37.2 C) 99.1 F (37.3 C)   TempSrc: Oral Oral Oral   Resp: _0 Height:      Weight:      SpO2: 98% 92% 94%     Last BM Date: 02/24/15  Intake/Output   Yesterday:  07/07 0701 - 07/08 0700 In: 1500 [I.V.:1500] Out: 1692 [Urine:1675; Drains:17] This shift:  Total I/O In: 500 [I.V.:500] Out: 155 [Urine:150; Drains:5]   Physical Exam: General: Pt awake/alert/oriented x4 in no acute distress Chest: cta. No chest wall pain w good excursion CV:  Pulses intact.  Regular rhythm Abdomen:+BS, distended, appropriately tender.  Incisions are c/d/i.  Drain with serous output.  Ext:  SCDs BLE.  No mjr edema.  No cyanosis Skin: No petechiae / purpura   Problem List:   Active Problems:   Perforated appendicitis   DM2 (diabetes mellitus, type 2)   HTN (hypertension)    Results:   Labs: Results for orders placed or performed during the hospital encounter of 02/24/15 (from the past 48 hour(s))  Glucose, capillary     Status: Abnormal   Collection Time: 02/25/15 12:18 PM  Result Value Ref Range   Glucose-Capillary 241 (H) 65 - 99 mg/dL  Glucose, capillary     Status: Abnormal   Collection Time: 02/25/15  5:06 PM  Result Value Ref Range   Glucose-Capillary 249 (H) 65 - 99 mg/dL  Glucose, capillary     Status: Abnormal   Collection Time: 02/25/15  8:07 PM  Result Value Ref Range   Glucose-Capillary 207 (H) 65 - 99 mg/dL  Glucose, capillary     Status:  Abnormal   Collection Time: 02/25/15 11:40 PM  Result Value Ref Range   Glucose-Capillary 188 (H) 65 - 99 mg/dL  CBC     Status: Abnormal   Collection Time: 02/26/15  4:09 AM  Result Value Ref Range   WBC 11.6 (H) 4.0 - 10.5 K/uL   RBC 3.45 (L) 4.22 - 5.81 MIL/uL   Hemoglobin 10.6 (L) 13.0 - 17.0 g/dL   HCT 30.4 (L) 39.0 - 52.0 %   MCV 88.1 78.0 - 100.0 fL   MCH 30.7 26.0 - 34.0 pg   MCHC 34.9 30.0 - 36.0 g/dL   RDW 12.9 11.5 - 15.5 %   Platelets 249 150 - 400 K/uL  Basic metabolic panel     Status: Abnormal   Collection Time: 02/26/15  4:09 AM  Result Value Ref Range   Sodium 137 135 - 145 mmol/L   Potassium 3.9 3.5 - 5.1 mmol/L   Chloride 105 101 - 111 mmol/L   CO2 25 22 - 32 mmol/L   Glucose, Bld 208 (H) 65 - 99 mg/dL   BUN 13 6 - 20 mg/dL   Creatinine, Ser 1.50 (H) 0.61 - 1.24 mg/dL   Calcium 7.5 (L) 8.9 - 10.3 mg/dL  GFR calc non Af Amer 49 (L) >60 mL/min   GFR calc Af Amer 56 (L) >60 mL/min    Comment: (NOTE) The eGFR has been calculated using the CKD EPI equation. This calculation has not been validated in all clinical situations. eGFR's persistently <60 mL/min signify possible Chronic Kidney Disease.    Anion gap 7 5 - 15  Hemoglobin A1c     Status: Abnormal   Collection Time: 02/26/15  4:09 AM  Result Value Ref Range   Hgb A1c MFr Bld 9.3 (H) 4.8 - 5.6 %    Comment: (NOTE)         Pre-diabetes: 5.7 - 6.4         Diabetes: >6.4         Glycemic control for adults with diabetes: <7.0    Mean Plasma Glucose 220 mg/dL    Comment: (NOTE) Performed At: [facility] [place], [place] [id number] Lindon Romp MD VQ:[id number]   Glucose, capillary     Status: Abnormal   Collection Time: 02/26/15  4:24 AM  Result Value Ref Range   Glucose-Capillary 179 (H) 65 - 99 mg/dL  Glucose, capillary     Status: Abnormal   Collection Time: 02/26/15  8:12 AM  Result Value Ref Range   Glucose-Capillary 173 (H) 65 - 99 mg/dL  Glucose, capillary      Status: Abnormal   Collection Time: 02/26/15 12:20 PM  Result Value Ref Range   Glucose-Capillary 214 (H) 65 - 99 mg/dL  Glucose, capillary     Status: Abnormal   Collection Time: 02/26/15  4:15 PM  Result Value Ref Range   Glucose-Capillary 196 (H) 65 - 99 mg/dL  Glucose, capillary     Status: Abnormal   Collection Time: 02/26/15  8:12 PM  Result Value Ref Range   Glucose-Capillary 209 (H) 65 - 99 mg/dL  Glucose, capillary     Status: Abnormal   Collection Time: 02/26/15 11:15 PM  Result Value Ref Range   Glucose-Capillary 195 (H) 65 - 99 mg/dL  Glucose, capillary     Status: Abnormal   Collection Time: 02/27/15  4:28 AM  Result Value Ref Range   Glucose-Capillary 170 (H) 65 - 99 mg/dL  CBC     Status: Abnormal   Collection Time: 02/27/15  5:30 AM  Result Value Ref Range   WBC 10.7 (H) 4.0 - 10.5 K/uL   RBC 3.44 (L) 4.22 - 5.81 MIL/uL   Hemoglobin 10.1 (L) 13.0 - 17.0 g/dL   HCT 30.3 (L) 39.0 - 52.0 %   MCV 88.1 78.0 - 100.0 fL   MCH 29.4 26.0 - 34.0 pg   MCHC 33.3 30.0 - 36.0 g/dL   RDW 13.0 11.5 - 15.5 %   Platelets 299 150 - 400 K/uL  Basic metabolic panel     Status: Abnormal   Collection Time: 02/27/15  5:30 AM  Result Value Ref Range   Sodium 139 135 - 145 mmol/L   Potassium 3.9 3.5 - 5.1 mmol/L   Chloride 109 101 - 111 mmol/L   CO2 24 22 - 32 mmol/L   Glucose, Bld 178 (H) 65 - 99 mg/dL   BUN 13 6 - 20 mg/dL   Creatinine, Ser 1.36 (H) 0.61 - 1.24 mg/dL   Calcium 7.6 (L) 8.9 - 10.3 mg/dL   GFR calc non Af Amer 55 (L) >60 mL/min   GFR calc Af Amer >60 >60 mL/min    Comment: (NOTE) The eGFR  has been calculated using the CKD EPI equation. This calculation has not been validated in all clinical situations. eGFR's persistently <60 mL/min signify possible Chronic Kidney Disease.    Anion gap 6 5 - 15  Glucose, capillary     Status: Abnormal   Collection Time: 02/27/15  7:58 AM  Result Value Ref Range   Glucose-Capillary 163 (H) 65 - 99 mg/dL    Imaging /  Studies: No results found.  Medications / Allergies:  Scheduled Meds: . amLODipine  10 mg Oral Daily  . antiseptic oral rinse  7 mL Mouth Rinse BID  . cloNIDine  0.2 mg Oral TID  . enalapril  20 mg Oral BID  . heparin subcutaneous  5,000 Units Subcutaneous 3 times per day  . insulin aspart  0-20 Units Subcutaneous 6 times per day  . insulin glargine  5 Units Subcutaneous BID  . metoprolol succinate  50 mg Oral BID  . piperacillin-tazobactam (ZOSYN)  IV  3.375 g Intravenous Q8H   Continuous Infusions: . sodium chloride 125 mL/hr at 02/27/15 0004   PRN Meds:.hydrALAZINE, methocarbamol (ROBAXIN)  IV, morphine injection, ondansetron **OR** ondansetron (ZOFRAN) IV  Antibiotics: Anti-infectives    Start     Dose/Rate Route Frequency Ordered Stop   02/25/15 0200  piperacillin-tazobactam (ZOSYN) IVPB 3.375 g     3.375 g 12.5 mL/hr over 240 Minutes Intravenous Every 8 hours 02/25/15 0010     02/24/15 1745  piperacillin-tazobactam (ZOSYN) IVPB 3.375 g     3.375 g 100 mL/hr over 30 Minutes Intravenous  Once 02/24/15 1734 02/24/15 1817        Assessment/Plan Perforated acute appendicitis with generalized peritonitis POD#3 laparoscopic appendectomy---Dr. Zella Richer Post operative ileus -keep on ice chips and await resolution of ileus.  Does not need NGT at this point. -ambulate, IS, pain control -continue drain care -give dulcolax x2 today Acute v chronic renal insufficiency-appreciate medicine input. DM-poorly controlled, A1c 7/7 9.3.  CBGs stable.  Appreciate medicine management.  VTE prophylaxis-SCD/heparin ID-zosyn D#3 FEN-ice chips, IVF  Erby Pian, ANP-BC [facility] Pager [phone](7A-4:30P)   02/27/2015 10:42 AM

## 2015-02-27 NOTE — Progress Notes (Signed)
[facility] PROGRESS NOTE  Aaron MoseDivie E Axtman BJY:[id number] DOB: [birth year] DOA: 02/24/2015 PCP: No primary care provider on file.  Assessment/Plan: 1. DM2 1. Pt currently remains NPO 2. Per med rec, was on 4.6 units aspart bid and 5 units lantus tid (pt reports BID lantus) prior to admit 3. Presenting glucose near 500's, glucose now improved in the 170's 4. Cont SSI coverage. 5. Have continued 5 units BID lantus. Will increase to 7 units BID 6. Continue to titrate insulin accordingly 2. HTN 1. BP remains stable, albeit suboptimally controlled 2. Presently on norvasc 10mg , clonidine 0.2mg  tid, enalapril 20mg , metoprolol 50mg  qday 3. Will resume home spironolactone 3. Renal insufficiency, unclear if acute or chronic 1. Diuretic and spironolactone from home meds initially on hold 2. Spironolactone resumed per above 4. Perforated appendicitis 1. Pt is s/p surgery 7/5 2. Cont as per primary service  Procedures:  Appendectomy 7/5  HPI/Subjective: No complaints  Objective: Filed Vitals:   02/26/15 2136 02/27/15 0501 02/27/15 0922 02/27/15 1400  BP: 174/73 155/64 159/79 165/77  Pulse: 91 77  88  Temp: 99 F (37.2 C) 99.1 F (37.3 C)  98.7 F (37.1 C)  TempSrc: Oral Oral  Oral  Resp: 18 18  16   Height:      Weight:      SpO2: 92% 94%  92%    Intake/Output Summary (Last 24 hours) at 02/27/15 1731 Last data filed at 02/27/15 1400  Gross per 24 hour  Intake   2550 ml  Output   1340 ml  Net   1210 ml   Filed Weights   02/24/15 1521 02/24/15 1524  Weight: 127.007 kg (280 lb) 127.007 kg (280 lb)    Exam:   General:  Awake, in nad  Cardiovascular: regular, s1, s2  Respiratory: normal resp effort, no wheezing  Abdomen: soft,nondistended  Musculoskeletal: perfused, no clubbing   Data Reviewed: Basic Metabolic Panel:  Recent Labs Lab 02/24/15 1530 02/25/15 0440 02/26/15 0409 02/27/15 0530  NA 130* 135 137 139  K 4.7 4.6 3.9 3.9  CL 95* 102 105 109   CO2 22 25 25 24   GLUCOSE 536* 362* 208* 178*  BUN 18 14 13 13   CREATININE 1.83* 1.60* 1.50* 1.36*  CALCIUM 8.7* 7.5* 7.5* 7.6*   Liver Function Tests:  Recent Labs Lab 02/24/15 1530  AST 21  ALT 15*  ALKPHOS 70  BILITOT 1.7*  PROT 7.8  ALBUMIN 3.7    Recent Labs Lab 02/24/15 1530  LIPASE 18*   No results for input(s): AMMONIA in the last 168 hours. CBC:  Recent Labs Lab 02/24/15 1530 02/25/15 0440 02/26/15 0409 02/27/15 0530  WBC 16.5* 14.2* 11.6* 10.7*  NEUTROABS 14.8*  --   --   --   HGB 13.6 10.9* 10.6* 10.1*  HCT 38.0* 31.7* 30.4* 30.3*  MCV 86.2 86.6 88.1 88.1  PLT 283 239 249 299   Cardiac Enzymes: No results for input(s): CKTOTAL, CKMB, CKMBINDEX, TROPONINI in the last 168 hours. BNP (last 3 results) No results for input(s): BNP in the last 8760 hours.  ProBNP (last 3 results) No results for input(s): PROBNP in the last 8760 hours.  CBG:  Recent Labs Lab 02/26/15 2315 02/27/15 0428 02/27/15 0758 02/27/15 1207 02/27/15 1627  GLUCAP 195* 170* 163* 160* 179*    No results found for this or any previous visit (from the past 240 hour(s)).   Studies: No results found.  Scheduled Meds: . amLODipine  10 mg Oral Daily  .  antiseptic oral rinse  7 mL Mouth Rinse BID  . bisacodyl  10 mg Rectal BID  . cloNIDine  0.2 mg Oral TID  . enalapril  20 mg Oral BID  . heparin subcutaneous  5,000 Units Subcutaneous 3 times per day  . insulin aspart  0-20 Units Subcutaneous 6 times per day  . insulin glargine  7 Units Subcutaneous BID  . metoprolol succinate  50 mg Oral BID  . piperacillin-tazobactam (ZOSYN)  IV  3.375 g Intravenous Q8H  . spironolactone  50 mg Oral Daily   Continuous Infusions: . sodium chloride 125 mL/hr at 02/27/15 1721    Active Problems:   Perforated appendicitis   DM2 (diabetes mellitus, type 2)   HTN (hypertension)    CHIU, STEPHEN K  [facility] Pager [phone]. If 7PM-7AM, please contact night-coverage at  [email or web address], password [facility] 02/27/2015, 5:31 PM  LOS: 3 days

## 2015-02-28 DIAGNOSIS — E118 Type 2 diabetes mellitus with unspecified complications: Secondary | ICD-10-CM

## 2015-02-28 LAB — GLUCOSE, CAPILLARY
GLUCOSE-CAPILLARY: 186 mg/dL — AB (ref 65–99)
GLUCOSE-CAPILLARY: 193 mg/dL — AB (ref 65–99)
GLUCOSE-CAPILLARY: 216 mg/dL — AB (ref 65–99)
GLUCOSE-CAPILLARY: 234 mg/dL — AB (ref 65–99)
Glucose-Capillary: 191 mg/dL — ABNORMAL HIGH (ref 65–99)
Glucose-Capillary: 223 mg/dL — ABNORMAL HIGH (ref 65–99)
Glucose-Capillary: 234 mg/dL — ABNORMAL HIGH (ref 65–99)

## 2015-02-28 LAB — BASIC METABOLIC PANEL
Anion gap: 6 (ref 5–15)
BUN: 13 mg/dL (ref 6–20)
CHLORIDE: 107 mmol/L (ref 101–111)
CO2: 25 mmol/L (ref 22–32)
Calcium: 7.6 mg/dL — ABNORMAL LOW (ref 8.9–10.3)
Creatinine, Ser: 1.34 mg/dL — ABNORMAL HIGH (ref 0.61–1.24)
GFR calc Af Amer: >60 mL/min (ref >60)
GFR calc non Af Amer: 56 mL/min — ABNORMAL LOW (ref >60)
Glucose, Bld: 202 mg/dL — ABNORMAL HIGH (ref 65–99)
Potassium: 4 mmol/L (ref 3.5–5.1)
SODIUM: 138 mmol/L (ref 135–145)

## 2015-02-28 MED ORDER — INSULIN ASPART 100 UNIT/ML ~~LOC~~ SOLN
2.0000 [IU] | Freq: Three times a day (TID) | SUBCUTANEOUS | Status: DC
  Administered 2015-02-28 – 2015-03-01 (×4): 2 [IU] via SUBCUTANEOUS

## 2015-02-28 MED ORDER — HYDRALAZINE HCL 10 MG PO TABS
10.0000 mg | ORAL_TABLET | Freq: Three times a day (TID) | ORAL | Status: DC
  Administered 2015-02-28 – 2015-03-01 (×4): 10 mg via ORAL
  Filled 2015-02-28 (×6): qty 1

## 2015-02-28 MED ORDER — INSULIN GLARGINE 100 UNIT/ML ~~LOC~~ SOLN
9.0000 [IU] | Freq: Two times a day (BID) | SUBCUTANEOUS | Status: DC
  Administered 2015-02-28: 9 [IU] via SUBCUTANEOUS
  Filled 2015-02-28 (×2): qty 0.09

## 2015-02-28 MED ORDER — INSULIN ASPART 100 UNIT/ML ~~LOC~~ SOLN
0.0000 [IU] | Freq: Three times a day (TID) | SUBCUTANEOUS | Status: DC
  Administered 2015-02-28: 7 [IU] via SUBCUTANEOUS
  Administered 2015-03-01 (×2): 4 [IU] via SUBCUTANEOUS

## 2015-02-28 NOTE — Progress Notes (Signed)
[facility] PROGRESS NOTE  Richard Russell ZOX:[id number] DOB: Jan 04, 1951 DOA: 02/24/2015 PCP: No primary care provider on file.  Assessment/Plan: 1. DM2 1. Pt started today on clears, will add mealtime coverage, change SSI with meals 2. Increase Lantus to 9 U BID 3. Per med rec, was on 4.6 units aspart bid and 5 units lantus tid (pt reports BID lantus) prior to admit 4. Presenting glucose near 500's, glucose now improved  5. Continue to titrate insulin accordingly 2. HTN 1. BP remains stable, albeit suboptimally controlled, still in the 170s systolic 2. Presently on norvasc , clonidine 0.2mg  tid, enalapril , metoprolol  qday, spironolactone 3. Add hydralazine 10 TID today 4. Patient endorses chronic poorly controlled HTN, in his PCP office usually in the 170s 3. Renal insufficiency, unclear if acute or chronic 1. Diuretic and spironolactone from home meds initially on hold 2. Spironolactone resumed per above 4. Perforated appendicitis 1. Pt is s/p surgery 7/5 2. Cont as per primary service  Procedures:  Appendectomy 7/5  HPI/Subjective: No complaints  Objective: Filed Vitals:   02/27/15 0922 02/27/15 1400 02/27/15 2138 02/28/15 0605  BP: 159/79 165/77 101/71 160/78  Pulse:  88 89 86  Temp:  98.7 F (37.1 C) 99.5 F (37.5 C) 98.8 F (37.1 C)  TempSrc:  Oral Oral Oral  Resp:  Height:      Weight:      SpO2:  92% 96% 96%    Intake/Output Summary (Last 24 hours) at 02/28/15 0729 Last data filed at 02/28/15 8119  Gross per 24 hour  Intake 3073.58 ml  Output    560 ml  Net 2513.58 ml   Filed Weights   02/24/15 1521 02/24/15 1524  Weight: 127.007 kg (280 lb) 127.007 kg (280 lb)   Exam:  General:  Awake, in nad  Cardiovascular: regular, s1, s2  Respiratory: normal resp effort, no wheezing  Abdomen: soft,nondistended  Musculoskeletal: perfused, no clubbing   Data Reviewed: Basic Metabolic Panel:  Recent Labs Lab 02/24/15 1530  02/25/15 0440 02/26/15 0409 02/27/15 0530 02/28/15 0525  NA 130* 135 137 139 138  K 4.7 4.6 3.9 3.9 4.0  CL 95* 102 105 109 107  CO2 GLUCOSE 536* 362* 208* 178* 202*  BUN CREATININE 1.83* 1.60* 1.50* 1.36* 1.34*  CALCIUM 8.7* 7.5* 7.5* 7.6* 7.6*   Liver Function Tests:  Recent Labs Lab 02/24/15 1530  AST 21  ALT 15*  ALKPHOS 70  BILITOT 1.7*  PROT 7.8  ALBUMIN 3.7    Recent Labs Lab 02/24/15 1530  LIPASE 18*   No results for input(s): AMMONIA in the last 168 hours. CBC:  Recent Labs Lab 02/24/15 1530 02/25/15 0440 02/26/15 0409 02/27/15 0530  WBC 16.5* 14.2* 11.6* 10.7*  NEUTROABS 14.8*  --   --   --   HGB 13.6 10.9* 10.6* 10.1*  HCT 38.0* 31.7* 30.4* 30.3*  MCV 86.2 86.6 88.1 88.1  PLT 283 239 249 299   Cardiac Enzymes: No results for input(s): CKTOTAL, CKMB, CKMBINDEX, TROPONINI in the last 168 hours. BNP (last 3 results) No results for input(s): BNP in the last 8760 hours.  ProBNP (last 3 results) No results for input(s): PROBNP in the last 8760 hours.  CBG:  Recent Labs Lab 02/27/15 1207 02/27/15 1627 02/27/15 2020 02/28/15 0011 02/28/15 0346  GLUCAP 160* 179* 209* 193* 191*    No results found for this or any previous  visit (from the past 240 hour(s)).   Studies: No results found.  Scheduled Meds: . amLODipine  10 mg Oral Daily  . antiseptic oral rinse  7 mL Mouth Rinse BID  . bisacodyl  10 mg Rectal BID  . cloNIDine  0.2 mg Oral TID  . enalapril  20 mg Oral BID  . heparin subcutaneous  5,000 Units Subcutaneous 3 times per day  . insulin aspart  0-20 Units Subcutaneous 6 times per day  . insulin glargine  7 Units Subcutaneous BID  . metoprolol succinate  50 mg Oral BID  . piperacillin-tazobactam (ZOSYN)  IV  3.375 g Intravenous Q8H  . spironolactone  50 mg Oral Daily   Continuous Infusions: . sodium chloride 125 mL/hr at 02/28/15 0607    Active Problems:   Perforated appendicitis   DM2  (diabetes mellitus, type 2)   HTN (hypertension)  Richard Russell, Richard Russell  [facility] Pager [phone]. If 7PM-7AM, please contact night-coverage at [email or web address], password [email or web address] 02/28/2015, 7:29 AM  LOS: 4 days

## 2015-02-28 NOTE — Plan of Care (Signed)
Problem: Phase II Progression Outcomes Goal: Vital signs stable Outcome: Not Progressing Hypertension continues, despite multiple BP meds.  MD monitoring and added scheduled hydralazine today. Goal: Tolerating diet Outcome: Progressing Tolerating CL diet, but CL only impacting pt's CBGs which continue to require SS coverage.  MD added 2 units with meals today.

## 2015-02-28 NOTE — Progress Notes (Signed)
Patient ID: COREYON NICOTRA, male   DOB: November 16, 1950, 64 y.o.   MRN: [id number]     [facility]      [address] [place], [place] [address]    Phone: [phone] FAX: [phone]     Subjective: Having BM's,  Walking.  Feels better  Objective:  Vital signs:  Filed Vitals:   02/27/15 0922 02/27/15 1400 02/27/15 2138 02/28/15 0605  BP: 159/79 165/77 101/71 160/78  Pulse:  88 89 86  Temp:  98.7 F (37.1 C) 99.5 F (37.5 C) 98.8 F (37.1 C)  TempSrc:  Oral Oral Oral  Resp:  16 18 18   Height:      Weight:      SpO2:  92% 96% 96%    Last BM Date: 02/24/15  Intake/Output   Yesterday:  07/08 0701 - 07/09 0700 In: 3073.6 [P.O.:9; I.V.:3014.6; IV Piggyback:50] Out: 560 [Urine:300; Drains:260] This shift:  Total I/O In: -  Out: 70 [Drains:70]   Physical Exam: General: Pt awake/alert/oriented x4 in no acute distress Chest: cta. No chest wall pain w good excursion CV:  Pulses intact.  Regular rhythm Abdomen: distended, appropriately tender.  Incisions are c/d/i.  Drain with serous output.  Ext:  SCDs BLE.  No mjr edema.  No cyanosis Skin: No petechiae / purpura   Problem List:   Active Problems:   Perforated appendicitis   DM2 (diabetes mellitus, type 2)   HTN (hypertension)    Results:   Labs: Results for orders placed or performed during the hospital encounter of 02/24/15 (from the past 48 hour(s))  Glucose, capillary     Status: Abnormal   Collection Time: 02/26/15 12:20 PM  Result Value Ref Range   Glucose-Capillary 214 (H) 65 - 99 mg/dL  Glucose, capillary     Status: Abnormal   Collection Time: 02/26/15  4:15 PM  Result Value Ref Range   Glucose-Capillary 196 (H) 65 - 99 mg/dL  Glucose, capillary     Status: Abnormal   Collection Time: 02/26/15  8:12 PM  Result Value Ref Range   Glucose-Capillary 209 (H) 65 - 99 mg/dL  Glucose, capillary     Status: Abnormal   Collection Time: 02/26/15 11:15 PM   Result Value Ref Range   Glucose-Capillary 195 (H) 65 - 99 mg/dL  Glucose, capillary     Status: Abnormal   Collection Time: 02/27/15  4:28 AM  Result Value Ref Range   Glucose-Capillary 170 (H) 65 - 99 mg/dL  CBC     Status: Abnormal   Collection Time: 02/27/15  5:30 AM  Result Value Ref Range   WBC 10.7 (H) 4.0 - 10.5 K/uL   RBC 3.44 (L) 4.22 - 5.81 MIL/uL   Hemoglobin 10.1 (L) 13.0 - 17.0 g/dL   HCT 30.3 (L) 39.0 - 52.0 %   MCV 88.1 78.0 - 100.0 fL   MCH 29.4 26.0 - 34.0 pg   MCHC 33.3 30.0 - 36.0 g/dL   RDW 13.0 11.5 - 15.5 %   Platelets 299 150 - 400 K/uL  Basic metabolic panel     Status: Abnormal   Collection Time: 02/27/15  5:30 AM  Result Value Ref Range   Sodium 139 135 - 145 mmol/L   Potassium 3.9 3.5 - 5.1 mmol/L   Chloride 109 101 - 111 mmol/L   CO2 24 22 - 32 mmol/L   Glucose, Bld 178 (H) 65 - 99 mg/dL   BUN 13 6 -  20 mg/dL   Creatinine, Ser 1.36 (H) 0.61 - 1.24 mg/dL   Calcium 7.6 (L) 8.9 - 10.3 mg/dL   GFR calc non Af Amer 55 (L) >60 mL/min   GFR calc Af Amer >60 >60 mL/min    Comment: (NOTE) The eGFR has been calculated using the CKD EPI equation. This calculation has not been validated in all clinical situations. eGFR's persistently <60 mL/min signify possible Chronic Kidney Disease.    Anion gap 6 5 - 15  Glucose, capillary     Status: Abnormal   Collection Time: 02/27/15  7:58 AM  Result Value Ref Range   Glucose-Capillary 163 (H) 65 - 99 mg/dL  Glucose, capillary     Status: Abnormal   Collection Time: 02/27/15 12:07 PM  Result Value Ref Range   Glucose-Capillary 160 (H) 65 - 99 mg/dL  Glucose, capillary     Status: Abnormal   Collection Time: 02/27/15  4:27 PM  Result Value Ref Range   Glucose-Capillary 179 (H) 65 - 99 mg/dL  Glucose, capillary     Status: Abnormal   Collection Time: 02/27/15  8:20 PM  Result Value Ref Range   Glucose-Capillary 209 (H) 65 - 99 mg/dL  Glucose, capillary     Status: Abnormal   Collection Time: 02/28/15  12:11 AM  Result Value Ref Range   Glucose-Capillary 193 (H) 65 - 99 mg/dL  Glucose, capillary     Status: Abnormal   Collection Time: 02/28/15  3:46 AM  Result Value Ref Range   Glucose-Capillary 191 (H) 65 - 99 mg/dL  Basic metabolic panel     Status: Abnormal   Collection Time: 02/28/15  5:25 AM  Result Value Ref Range   Sodium 138 135 - 145 mmol/L   Potassium 4.0 3.5 - 5.1 mmol/L   Chloride 107 101 - 111 mmol/L   CO2 25 22 - 32 mmol/L   Glucose, Bld 202 (H) 65 - 99 mg/dL   BUN 13 6 - 20 mg/dL   Creatinine, Ser 1.34 (H) 0.61 - 1.24 mg/dL   Calcium 7.6 (L) 8.9 - 10.3 mg/dL   GFR calc non Af Amer 56 (L) >60 mL/min   GFR calc Af Amer >60 >60 mL/min    Comment: (NOTE) The eGFR has been calculated using the CKD EPI equation. This calculation has not been validated in all clinical situations. eGFR's persistently <60 mL/min signify possible Chronic Kidney Disease.    Anion gap 6 5 - 15  Glucose, capillary     Status: Abnormal   Collection Time: 02/28/15  7:38 AM  Result Value Ref Range   Glucose-Capillary 186 (H) 65 - 99 mg/dL    Imaging / Studies: No results found.  Medications / Allergies:  Scheduled Meds: . amLODipine  10 mg Oral Daily  . antiseptic oral rinse  7 mL Mouth Rinse BID  . cloNIDine  0.2 mg Oral TID  . enalapril  20 mg Oral BID  . heparin subcutaneous  5,000 Units Subcutaneous 3 times per day  . insulin aspart  0-20 Units Subcutaneous 6 times per day  . insulin glargine  7 Units Subcutaneous BID  . metoprolol succinate  50 mg Oral BID  . piperacillin-tazobactam (ZOSYN)  IV  3.375 g Intravenous Q8H  . spironolactone  50 mg Oral Daily   Continuous Infusions: . sodium chloride 125 mL/hr at 02/28/15 0607   PRN Meds:.hydrALAZINE, methocarbamol (ROBAXIN)  IV, morphine injection, ondansetron **OR** ondansetron (ZOFRAN) IV  Antibiotics: Anti-infectives    Start  Dose/Rate Route Frequency Ordered Stop   02/25/15 0200  piperacillin-tazobactam (ZOSYN) IVPB  3.375 g     3.375 g 12.5 mL/hr over 240 Minutes Intravenous Every 8 hours 02/25/15 0010     02/24/15 1745  piperacillin-tazobactam (ZOSYN) IVPB 3.375 g     3.375 g 100 mL/hr over 30 Minutes Intravenous  Once 02/24/15 1734 02/24/15 1817        Assessment/Plan Perforated acute appendicitis with generalized peritonitis POD#4 laparoscopic appendectomy---Dr. Zella Richer Post operative ileus- seems to be resolving - start clears -ambulate, IS, pain control -continue drain care -give dulcolax x2 today Acute v chronic renal insufficiency-appreciate medicine input. DM-poorly controlled, A1c 7/7 9.3.  CBGs stable.  Appreciate medicine management.  VTE prophylaxis-SCD/heparin ID-zosyn D#4 FEN- IVF, clears  Rosario Adie, MD  [facility]    02/28/2015 10:03 AM

## 2015-03-01 DIAGNOSIS — IMO0002 Reserved for concepts with insufficient information to code with codable children: Secondary | ICD-10-CM

## 2015-03-01 DIAGNOSIS — E1165 Type 2 diabetes mellitus with hyperglycemia: Secondary | ICD-10-CM

## 2015-03-01 LAB — BASIC METABOLIC PANEL
Anion gap: 5 (ref 5–15)
BUN: 9 mg/dL (ref 6–20)
CHLORIDE: 108 mmol/L (ref 101–111)
CO2: 23 mmol/L (ref 22–32)
CREATININE: 1.15 mg/dL (ref 0.61–1.24)
Calcium: 7.5 mg/dL — ABNORMAL LOW (ref 8.9–10.3)
GFR calc Af Amer: >60 mL/min (ref >60)
GFR calc non Af Amer: >60 mL/min (ref >60)
Glucose, Bld: 216 mg/dL — ABNORMAL HIGH (ref 65–99)
Potassium: 3.5 mmol/L (ref 3.5–5.1)
Sodium: 136 mmol/L (ref 135–145)

## 2015-03-01 LAB — GLUCOSE, CAPILLARY
GLUCOSE-CAPILLARY: 190 mg/dL — AB (ref 65–99)
GLUCOSE-CAPILLARY: 191 mg/dL — AB (ref 65–99)
GLUCOSE-CAPILLARY: 158 mg/dL — AB (ref 65–99)
Glucose-Capillary: 200 mg/dL — ABNORMAL HIGH (ref 65–99)
Glucose-Capillary: 146 mg/dL — ABNORMAL HIGH (ref 65–99)

## 2015-03-01 MED ORDER — INSULIN GLARGINE 100 UNIT/ML ~~LOC~~ SOLN
15.0000 [IU] | Freq: Two times a day (BID) | SUBCUTANEOUS | Status: DC
  Administered 2015-03-01 – 2015-03-02 (×3): 15 [IU] via SUBCUTANEOUS
  Filled 2015-03-01 (×4): qty 0.15

## 2015-03-01 MED ORDER — BISACODYL 10 MG RE SUPP: 10.0000 mg | Freq: Two times a day (BID) | RECTAL | Status: DC | PRN

## 2015-03-01 MED ORDER — INSULIN ASPART 100 UNIT/ML ~~LOC~~ SOLN
0.0000 [IU] | SUBCUTANEOUS | Status: DC
  Administered 2015-03-01: 3 [IU] via SUBCUTANEOUS
  Administered 2015-03-01 (×2): 4 [IU] via SUBCUTANEOUS
  Administered 2015-03-02: 3 [IU] via SUBCUTANEOUS
  Administered 2015-03-02 (×2): 4 [IU] via SUBCUTANEOUS
  Administered 2015-03-02: 3 [IU] via SUBCUTANEOUS
  Administered 2015-03-02 – 2015-03-03 (×2): 4 [IU] via SUBCUTANEOUS
  Administered 2015-03-03 (×3): 3 [IU] via SUBCUTANEOUS
  Administered 2015-03-03 (×2): 4 [IU] via SUBCUTANEOUS
  Administered 2015-03-04: 3 [IU] via SUBCUTANEOUS
  Administered 2015-03-04: 4 [IU] via SUBCUTANEOUS
  Administered 2015-03-04: 3 [IU] via SUBCUTANEOUS
  Administered 2015-03-04 (×3): 7 [IU] via SUBCUTANEOUS
  Administered 2015-03-05: 4 [IU] via SUBCUTANEOUS
  Administered 2015-03-05: 3 [IU] via SUBCUTANEOUS

## 2015-03-01 MED ORDER — METOPROLOL TARTRATE 1 MG/ML IV SOLN
5.0000 mg | Freq: Four times a day (QID) | INTRAVENOUS | Status: DC
  Administered 2015-03-01 – 2015-03-02 (×3): 5 mg via INTRAVENOUS
  Filled 2015-03-01 (×7): qty 5

## 2015-03-01 MED ORDER — PROMETHAZINE HCL 25 MG/ML IJ SOLN
12.5000 mg | Freq: Four times a day (QID) | INTRAMUSCULAR | Status: DC | PRN
  Administered 2015-03-01 – 2015-03-03 (×2): 12.5 mg via INTRAVENOUS
  Filled 2015-03-01 (×2): qty 1

## 2015-03-01 MED ORDER — CLONIDINE HCL 0.2 MG/24HR TD PTWK
0.2000 mg | MEDICATED_PATCH | TRANSDERMAL | Status: DC
  Administered 2015-03-01: 0.2 mg via TRANSDERMAL
  Filled 2015-03-01: qty 1

## 2015-03-01 MED ORDER — ENALAPRILAT 1.25 MG/ML IV SOLN
0.6250 mg | Freq: Four times a day (QID) | INTRAVENOUS | Status: DC
  Administered 2015-03-01 – 2015-03-02 (×3): 0.625 mg via INTRAVENOUS
  Filled 2015-03-01 (×7): qty 0.5

## 2015-03-01 MED ORDER — HYDRALAZINE HCL 20 MG/ML IJ SOLN
10.0000 mg | INTRAMUSCULAR | Status: DC | PRN
  Administered 2015-03-01 – 2015-03-02 (×3): 10 mg via INTRAVENOUS
  Filled 2015-03-01 (×3): qty 1

## 2015-03-01 NOTE — Progress Notes (Signed)
Patient ID: Richard Russell, male   DOB: [birth year], 64 y.o.   MRN: [id number]     [facility]      [address] [place], [place] [address]    Phone: [phone] FAX: [phone]     Subjective: No further BM's or flatus,  Walking some.  Did not tolerate clears well with some nausea this am.   Objective:  Vital signs:  Filed Vitals:   02/28/15 1950 02/28/15 2254 02/28/15 2342 03/01/15 0200  BP: 178/69 171/68 153/79 155/67  Pulse: 88 71 79 77  Temp:    98.9 F (37.2 C)  TempSrc:    Oral  Resp:    20  Height:      Weight:      SpO2:    94%    Last BM Date: 02/28/15  Intake/Output   Yesterday:  07/09 0701 - 07/10 0700 In: 4455.4 [P.O.:1320; I.V.:2985.4; IV Piggyback:150] Out: 7989 [Urine:1325; Drains:510] This shift:  Total I/O In: -  Out: 40 [Drains:40]   Physical Exam: General: Pt awake/alert/oriented x4 in no acute distress Chest: cta. No chest wall pain w good excursion Abdomen: distended, appropriately tender.  Incisions are c/d/i.  Drain with serous output.  Ext:  SCDs BLE.  No mjr edema.  No cyanosis Skin: No petechiae / purpura   Problem List:   Active Problems:   Perforated appendicitis   DM2 (diabetes mellitus, type 2)   HTN (hypertension)    Results:   Labs: Results for orders placed or performed during the hospital encounter of 02/24/15 (from the past 48 hour(s))  Glucose, capillary     Status: Abnormal   Collection Time: 02/27/15 12:07 PM  Result Value Ref Range   Glucose-Capillary 160 (H) 65 - 99 mg/dL  Glucose, capillary     Status: Abnormal   Collection Time: 02/27/15  4:27 PM  Result Value Ref Range   Glucose-Capillary 179 (H) 65 - 99 mg/dL  Glucose, capillary     Status: Abnormal   Collection Time: 02/27/15  8:20 PM  Result Value Ref Range   Glucose-Capillary 209 (H) 65 - 99 mg/dL  Glucose, capillary     Status: Abnormal   Collection Time: 02/28/15 12:11 AM  Result Value Ref Range    Glucose-Capillary 193 (H) 65 - 99 mg/dL  Glucose, capillary     Status: Abnormal   Collection Time: 02/28/15  3:46 AM  Result Value Ref Range   Glucose-Capillary 191 (H) 65 - 99 mg/dL  Basic metabolic panel     Status: Abnormal   Collection Time: 02/28/15  5:25 AM  Result Value Ref Range   Sodium 138 135 - 145 mmol/L   Potassium 4.0 3.5 - 5.1 mmol/L   Chloride 107 101 - 111 mmol/L   CO2 25 22 - 32 mmol/L   Glucose, Bld 202 (H) 65 - 99 mg/dL   BUN 13 6 - 20 mg/dL   Creatinine, Ser 1.34 (H) 0.61 - 1.24 mg/dL   Calcium 7.6 (L) 8.9 - 10.3 mg/dL   GFR calc non Af Amer 56 (L) >60 mL/min   GFR calc Af Amer >60 >60 mL/min    Comment: (NOTE) The eGFR has been calculated using the CKD EPI equation. This calculation has not been validated in all clinical situations. eGFR's persistently <60 mL/min signify possible Chronic Kidney Disease.    Anion gap 6 5 - 15  Glucose, capillary     Status: Abnormal   Collection Time:  02/28/15  7:38 AM  Result Value Ref Range   Glucose-Capillary 186 (H) 65 - 99 mg/dL  Glucose, capillary     Status: Abnormal   Collection Time: 02/28/15 12:07 PM  Result Value Ref Range   Glucose-Capillary 223 (H) 65 - 99 mg/dL   Comment 1 Notify RN    Comment 2 Document in Chart   Glucose, capillary     Status: Abnormal   Collection Time: 02/28/15  4:40 PM  Result Value Ref Range   Glucose-Capillary 216 (H) 65 - 99 mg/dL  Glucose, capillary     Status: Abnormal   Collection Time: 02/28/15  6:33 PM  Result Value Ref Range   Glucose-Capillary 234 (H) 65 - 99 mg/dL   Comment 1 Notify RN    Comment 2 Document in Chart   Glucose, capillary     Status: Abnormal   Collection Time: 02/28/15  9:11 PM  Result Value Ref Range   Glucose-Capillary 234 (H) 65 - 99 mg/dL  Basic metabolic panel     Status: Abnormal   Collection Time: 03/01/15  6:49 AM  Result Value Ref Range   Sodium 136 135 - 145 mmol/L   Potassium 3.5 3.5 - 5.1 mmol/L   Chloride 108 101 - 111 mmol/L    CO2 23 22 - 32 mmol/L   Glucose, Bld 216 (H) 65 - 99 mg/dL   BUN 9 6 - 20 mg/dL   Creatinine, Ser 1.15 0.61 - 1.24 mg/dL   Calcium 7.5 (L) 8.9 - 10.3 mg/dL   GFR calc non Af Amer >60 >60 mL/min   GFR calc Af Amer >60 >60 mL/min    Comment: (NOTE) The eGFR has been calculated using the CKD EPI equation. This calculation has not been validated in all clinical situations. eGFR's persistently <60 mL/min signify possible Chronic Kidney Disease.    Anion gap 5 5 - 15  Glucose, capillary     Status: Abnormal   Collection Time: 03/01/15  7:34 AM  Result Value Ref Range   Glucose-Capillary 190 (H) 65 - 99 mg/dL    Imaging / Studies: No results found.  Medications / Allergies:  Scheduled Meds: . amLODipine  10 mg Oral Daily  . antiseptic oral rinse  7 mL Mouth Rinse BID  . cloNIDine  0.2 mg Oral TID  . enalapril  20 mg Oral BID  . heparin subcutaneous  5,000 Units Subcutaneous 3 times per day  . hydrALAZINE  10 mg Oral 3 times per day  . insulin aspart  0-20 Units Subcutaneous TID WC  . insulin aspart  2 Units Subcutaneous TID WC  . insulin glargine  15 Units Subcutaneous BID  . metoprolol succinate  50 mg Oral BID  . piperacillin-tazobactam (ZOSYN)  IV  3.375 g Intravenous Q8H  . spironolactone  50 mg Oral Daily   Continuous Infusions: . sodium chloride 125 mL/hr at 02/28/15 1945   PRN Meds:.hydrALAZINE, methocarbamol (ROBAXIN)  IV, morphine injection, ondansetron **OR** ondansetron (ZOFRAN) IV  Antibiotics: Anti-infectives    Start     Dose/Rate Route Frequency Ordered Stop   02/25/15 0200  piperacillin-tazobactam (ZOSYN) IVPB 3.375 g     3.375 g 12.5 mL/hr over 240 Minutes Intravenous Every 8 hours 02/25/15 0010     02/24/15 1745  piperacillin-tazobactam (ZOSYN) IVPB 3.375 g     3.375 g 100 mL/hr over 30 Minutes Intravenous  Once 02/24/15 1734 02/24/15 1817        Assessment/Plan Perforated acute appendicitis with generalized peritonitis  POD#5 laparoscopic  appendectomy---Dr. Zella Richer Post operative ileus- seems to be slowly resolving - Cont limited clears -ambulate, IS, pain control -continue drain care -give dulcolax prn Acute v chronic renal insufficiency-appreciate medicine input. Cr trending down, IVF's decreased today  DM-poorly controlled, A1c 7/7 9.3.  CBGs better and stable but still has room to improve.  Appreciate medicine management.  VTE prophylaxis-SCD/heparin ID-zosyn D#5 FEN- decrease IVF, clears  Rosario Adie, MD  [facility]    03/01/2015 9:48 AM

## 2015-03-01 NOTE — Progress Notes (Signed)
[facility] PROGRESS NOTE  Richard Russell ZOX:[id number] DOB: 02-13-1951 DOA: 02/24/2015 PCP: No primary care provider on file.  Assessment/Plan: 1. DM2 1. Pt currently NPO, unable to tolerate clears 2. Per med rec, was on 4.6 units aspart bid and 5 units lantus tid (pt reports BID lantus) prior to admit 3. Presenting glucose near 500's, glucose improved, albeit still suboptimal in the 190's 4. Cont SSI coverage. 5. Thus far have increased lantus to 15 units BID 6. Continue to titrate insulin accordingly 2. HTN 1. Presently on norvasc , clonidine 0.2mg  tid, enalapril , metoprolol  qday, spironolactone, added PO hydralazine on 7/9 2. BP remains difficult to control. Suspect related to lack of normal bowel function thus poor absorption of PO meds 3. Will d/c PO meds and change to IV ACEI, IV betablocker, and clonidine patch 4. Cont PRN hydralazine but increase dose to  PRN 3. Renal insufficiency, unclear if acute or chronic 1. Diuretic and spironolactone from home meds initially on hold 4. Perforated appendicitis 1. Pt is s/p surgery 7/5 2. Cont as per primary service  Procedures:  Appendectomy 7/5  HPI/Subjective: Complains of abd feeling full  Objective: Filed Vitals:   02/28/15 2342 03/01/15 0200 03/01/15 1130 03/01/15 1356  BP: 153/79 155/67 177/80 166/81  Pulse: 79 77 80 84  Temp:  98.9 F (37.2 C)  99.1 F (37.3 C)  TempSrc:  Oral  Oral  Resp:  20  18  Height:      Weight:      SpO2:  94%  98%    Intake/Output Summary (Last 24 hours) at 03/01/15 1707 Last data filed at 03/01/15 1558  Gross per 24 hour  Intake 3632.5 ml  Output   3370 ml  Net  262.5 ml   Filed Weights   02/24/15 1521 02/24/15 1524  Weight: 127.007 kg (280 lb) 127.007 kg (280 lb)    Exam:   General:  Awake, in nad  Cardiovascular: regular, s1, s2  Respiratory: normal resp effort, no wheezing  Abdomen: soft,nondistended,obese  Musculoskeletal: perfused, no  clubbing, no cyanosis  Data Reviewed: Basic Metabolic Panel:  Recent Labs Lab 02/25/15 0440 02/26/15 0409 02/27/15 0530 02/28/15 0525 03/01/15 0649  NA 135 137 139 138 136  K 4.6 3.9 3.9 4.0 3.5  CL 102 105 109 107 108  CO2 GLUCOSE 362* 208* 178* 202* 216*  BUN CREATININE 1.60* 1.50* 1.36* 1.34* 1.15  CALCIUM 7.5* 7.5* 7.6* 7.6* 7.5*   Liver Function Tests:  Recent Labs Lab 02/24/15 1530  AST 21  ALT 15*  ALKPHOS 70  BILITOT 1.7*  PROT 7.8  ALBUMIN 3.7    Recent Labs Lab 02/24/15 1530  LIPASE 18*   No results for input(s): AMMONIA in the last 168 hours. CBC:  Recent Labs Lab 02/24/15 1530 02/25/15 0440 02/26/15 0409 02/27/15 0530  WBC 16.5* 14.2* 11.6* 10.7*  NEUTROABS 14.8*  --   --   --   HGB 13.6 10.9* 10.6* 10.1*  HCT 38.0* 31.7* 30.4* 30.3*  MCV 86.2 86.6 88.1 88.1  PLT 283 239 249 299   Cardiac Enzymes: No results for input(s): CKTOTAL, CKMB, CKMBINDEX, TROPONINI in the last 168 hours. BNP (last 3 results) No results for input(s): BNP in the last 8760 hours.  ProBNP (last 3 results) No results for input(s): PROBNP in the last 8760 hours.  CBG:  Recent Labs Lab 02/28/15 1640 02/28/15 1833 02/28/15 2111 03/01/15 8119  03/01/15 1214  GLUCAP 216* 234* 234* 190* 191*    No results found for this or any previous visit (from the past 240 hour(s)).   Studies: No results found.  Scheduled Meds: . antiseptic oral rinse  7 mL Mouth Rinse BID  . cloNIDine  0.2 mg Transdermal Weekly  . enalaprilat  0.625 mg Intravenous 4 times per day  . heparin subcutaneous  5,000 Units Subcutaneous 3 times per day  . insulin aspart  0-20 Units Subcutaneous 6 times per day  . insulin aspart  2 Units Subcutaneous TID WC  . insulin glargine  15 Units Subcutaneous BID  . metoprolol  5 mg Intravenous 4 times per day  . piperacillin-tazobactam (ZOSYN)  IV  3.375 g Intravenous Q8H   Continuous Infusions: . sodium chloride 75  mL/hr (03/01/15 1134)    Active Problems:   Perforated appendicitis   DM2 (diabetes mellitus, type 2)   HTN (hypertension)    CHIU, STEPHEN K  [facility] Pager [phone]. If 7PM-7AM, please contact night-coverage at [email or web address], password [email or web address] 03/01/2015, 5:07 PM  LOS: 5 days

## 2015-03-02 LAB — GLUCOSE, CAPILLARY
GLUCOSE-CAPILLARY: 160 mg/dL — AB (ref 65–99)
Glucose-Capillary: 143 mg/dL — ABNORMAL HIGH (ref 65–99)
Glucose-Capillary: 148 mg/dL — ABNORMAL HIGH (ref 65–99)
Glucose-Capillary: 193 mg/dL — ABNORMAL HIGH (ref 65–99)
Glucose-Capillary: 175 mg/dL — ABNORMAL HIGH (ref 65–99)

## 2015-03-02 LAB — BASIC METABOLIC PANEL
Anion gap: 9 (ref 5–15)
BUN: 6 mg/dL (ref 6–20)
CO2: 23 mmol/L (ref 22–32)
Calcium: 8.2 mg/dL — ABNORMAL LOW (ref 8.9–10.3)
Chloride: 106 mmol/L (ref 101–111)
Creatinine, Ser: 1.26 mg/dL — ABNORMAL HIGH (ref 0.61–1.24)
GFR calc non Af Amer: 60 mL/min — ABNORMAL LOW (ref >60)
Glucose, Bld: 150 mg/dL — ABNORMAL HIGH (ref 65–99)
Potassium: 3.3 mmol/L — ABNORMAL LOW (ref 3.5–5.1)
SODIUM: 138 mmol/L (ref 135–145)

## 2015-03-02 MED ORDER — METOPROLOL TARTRATE 1 MG/ML IV SOLN
10.0000 mg | Freq: Four times a day (QID) | INTRAVENOUS | Status: DC
  Administered 2015-03-02 – 2015-03-03 (×4): 10 mg via INTRAVENOUS
  Filled 2015-03-02 (×8): qty 10

## 2015-03-02 MED ORDER — ENALAPRILAT 1.25 MG/ML IV SOLN
1.2500 mg | Freq: Four times a day (QID) | INTRAVENOUS | Status: DC
  Administered 2015-03-02 – 2015-03-03 (×4): 1.25 mg via INTRAVENOUS
  Filled 2015-03-02 (×8): qty 1

## 2015-03-02 MED ORDER — LABETALOL HCL 5 MG/ML IV SOLN
10.0000 mg | INTRAVENOUS | Status: DC | PRN
  Administered 2015-03-03 – 2015-03-04 (×2): 10 mg via INTRAVENOUS
  Filled 2015-03-02 (×4): qty 4

## 2015-03-02 MED ORDER — INSULIN GLARGINE 100 UNIT/ML ~~LOC~~ SOLN
18.0000 [IU] | Freq: Two times a day (BID) | SUBCUTANEOUS | Status: DC
  Filled 2015-03-02: qty 0.18

## 2015-03-02 MED ORDER — POTASSIUM CHLORIDE 10 MEQ/100ML IV SOLN
10.0000 meq | INTRAVENOUS | Status: AC
Start: 2015-03-02 — End: 2015-03-02
  Administered 2015-03-02 (×4): 10 meq via INTRAVENOUS
  Filled 2015-03-02 (×4): qty 100

## 2015-03-02 MED ORDER — INSULIN GLARGINE 100 UNIT/ML ~~LOC~~ SOLN
20.0000 [IU] | Freq: Two times a day (BID) | SUBCUTANEOUS | Status: DC
  Administered 2015-03-02 – 2015-03-03 (×3): 20 [IU] via SUBCUTANEOUS
  Filled 2015-03-02 (×3): qty 0.2

## 2015-03-02 NOTE — Progress Notes (Signed)
[facility] PROGRESS NOTE  Richard Russell:[id number] DOB: 02-17-1951 DOA: 02/24/2015 PCP: No primary care provider on file.  Assessment/Plan: 1. DM2 1. Pt currently NPO, unable to tolerate clears 2. Per med rec, was on 4.6 units aspart bid and 5 units lantus tid (pt reports BID lantus) prior to admit 3. Presenting glucose near 500's, glucose improved, albeit still suboptimal in the 190's 4. Cont SSI coverage. 5. Thus far have increased lantus to 15 units BID 6. Continue to titrate insulin accordingly 2. HTN 1. Had been on norvasc , clonidine 0.2mg  tid, enalapril , metoprolol  qday, spironolactone with added PO hydralazine on 7/9 with BP gradually becoming more difficult to control correlating with lack of normal bowel function. Thus possible poor absorption of PO meds 2. Had d/c'd PO meds and change to IV ACEI, IV betablocker, and clonidine patch 3. Cont PRN hydralazine with added PRN labetalol IV 3. Renal insufficiency, unclear if acute or chronic 1. Diuretic and spironolactone from home meds initially on hold 4. Perforated appendicitis 1. Pt is s/p surgery 7/5 2. Cont as per primary service  Procedures:  Appendectomy 7/5  HPI/Subjective: Reports passing small stools this AM. No flatus  Objective: Filed Vitals:   03/02/15 1100 03/02/15 1200 03/02/15 1400 03/02/15 1551  BP: 194/83 185/89 206/85 193/74  Pulse: 103 93 95 99  Temp: 99.3 F (37.4 C) 99 F (37.2 C) 98.5 F (36.9 C)   TempSrc: Oral Oral Oral   Resp: Height:      Weight:      SpO2: 100% 99% 99%     Intake/Output Summary (Last 24 hours) at 03/02/15 1801 Last data filed at 03/02/15 1400  Gross per 24 hour  Intake    650 ml  Output   2650 ml  Net  -2000 ml   Filed Weights   02/24/15 1521 02/24/15 1524  Weight: 127.007 kg (280 lb) 127.007 kg (280 lb)    Exam:  General:  Awake, laying in bed,  in nad  Cardiovascular: regular, s1, s2  Respiratory: normal resp effort,  no wheezing  Abdomen: soft,nondistended,obese  Musculoskeletal: perfused, no clubbing, no cyanosis  Data Reviewed: Basic Metabolic Panel:  Recent Labs Lab 02/26/15 0409 02/27/15 0530 02/28/15 0525 03/01/15 0649 03/02/15 0445  NA 137 139 138 136 138  K 3.9 3.9 4.0 3.5 3.3*  CL 105 109 107 108 106  CO2 GLUCOSE 208* 178* 202* 216* 150*  BUN CREATININE 1.50* 1.36* 1.34* 1.15 1.26*  CALCIUM 7.5* 7.6* 7.6* 7.5* 8.2*   Liver Function Tests:  Recent Labs Lab 02/24/15 1530  AST 21  ALT 15*  ALKPHOS 70  BILITOT 1.7*  PROT 7.8  ALBUMIN 3.7    Recent Labs Lab 02/24/15 1530  LIPASE 18*   No results for input(s): AMMONIA in the last 168 hours. CBC:  Recent Labs Lab 02/24/15 1530 02/25/15 0440 02/26/15 0409 02/27/15 0530  WBC 16.5* 14.2* 11.6* 10.7*  NEUTROABS 14.8*  --   --   --   HGB 13.6 10.9* 10.6* 10.1*  HCT 38.0* 31.7* 30.4* 30.3*  MCV 86.2 86.6 88.1 88.1  PLT 283 239 249 299   Cardiac Enzymes: No results for input(s): CKTOTAL, CKMB, CKMBINDEX, TROPONINI in the last 168 hours. BNP (last 3 results) No results for input(s): BNP in the last 8760 hours.  ProBNP (last 3 results) No results for input(s): PROBNP in the last 8760  hours.  CBG:  Recent Labs Lab 03/01/15 2315 03/02/15 0343 03/02/15 0947 03/02/15 1159 03/02/15 1643  GLUCAP 146* 143* 160* 148* 193*    No results found for this or any previous visit (from the past 240 hour(s)).   Studies: No results found.  Scheduled Meds: . antiseptic oral rinse  7 mL Mouth Rinse BID  . cloNIDine  0.2 mg Transdermal Weekly  . enalaprilat  1.25 mg Intravenous 4 times per day  . heparin subcutaneous  5,000 Units Subcutaneous 3 times per day  . insulin aspart  0-20 Units Subcutaneous 6 times per day  . insulin glargine  20 Units Subcutaneous BID  . metoprolol  10 mg Intravenous 4 times per day  . piperacillin-tazobactam (ZOSYN)  IV  3.375 g Intravenous Q8H  . potassium  chloride  10 mEq Intravenous Q1 Hr x 4   Continuous Infusions: . sodium chloride 75 mL/hr at 03/02/15 1302    Active Problems:   Perforated appendicitis   DM2 (diabetes mellitus, type 2)   HTN (hypertension)   Diabetes mellitus type II, uncontrolled    Timohty Renbarger K  [facility] Pager [phone]. If 7PM-7AM, please contact night-coverage at [email or web address], password [email or web address] 03/02/2015, 6:01 PM  LOS: 6 days

## 2015-03-02 NOTE — Progress Notes (Signed)
Patient ID: Richard Russell, male   DOB: [birth year], 64 y.o.   MRN: [id number]     [facility]      [address] [place], [place] [address]    Phone: [phone] FAX: [phone]     Subjective: Having loose stools.  No vomiting.  Intermittent nausea.  No vomiting.  Hungry.  No dysuria.  43m from drain, serous. Walking in hallways.   Objective:  Vital signs:  Filed Vitals:   03/02/15 0547 03/02/15 0655 03/02/15 0800 03/02/15 1100  BP: 195/97 174/77 173/74 194/83  Pulse: 93  90 103  Temp: 99.2 F (37.3 C)  99.1 F (37.3 C) 99.3 F (37.4 C)  TempSrc: Oral  Oral Oral  Resp: 18  18 18   Height:      Weight:      SpO2: 99%  100% 100%    Last BM Date: 03/02/15  Intake/Output   Yesterday:  07/10 0701 - 07/11 0700 In: 2118.3 [P.O.:240; I.V.:1778.3; IV Piggyback:100] Out: 49509[Urine:4125; Drains:65] This shift:  Total I/O In: -  Out: 15 [Drains:15]   Physical Exam: General: Pt awake/alert/oriented x4 in no acute distress Chest: cta. No chest wall pain w good excursion CV:  Pulses intact.  Regular rhythm MS: Normal AROM mjr joints.  No obvious deformity Abdomen: Soft.  Distended(body habitus). Drain with serous output.  Mildly tender at incisions only.  No evidence of peritonitis.  No incarcerated hernias. Ext:  SCDs BLE.  No mjr edema.  No cyanosis Skin: No petechiae / purpura   Problem List:   Active Problems:   Perforated appendicitis   DM2 (diabetes mellitus, type 2)   HTN (hypertension)   Diabetes mellitus type II, uncontrolled    Results:   Labs: Results for orders placed or performed during the hospital encounter of 02/24/15 (from the past 48 hour(s))  Glucose, capillary     Status: Abnormal   Collection Time: 02/28/15 12:07 PM  Result Value Ref Range   Glucose-Capillary 223 (H) 65 - 99 mg/dL   Comment 1 Notify RN    Comment 2 Document in Chart   Glucose, capillary     Status: Abnormal   Collection Time: 02/28/15  4:40 PM  Result Value Ref Range   Glucose-Capillary 216 (H) 65 - 99 mg/dL  Glucose, capillary     Status: Abnormal   Collection Time: 02/28/15  6:33 PM  Result Value Ref Range   Glucose-Capillary 234 (H) 65 - 99 mg/dL   Comment 1 Notify RN    Comment 2 Document in Chart   Glucose, capillary     Status: Abnormal   Collection Time: 02/28/15  9:11 PM  Result Value Ref Range   Glucose-Capillary 234 (H) 65 - 99 mg/dL  Basic metabolic panel     Status: Abnormal   Collection Time: 03/01/15  6:49 AM  Result Value Ref Range   Sodium 136 135 - 145 mmol/L   Potassium 3.5 3.5 - 5.1 mmol/L   Chloride 108 101 - 111 mmol/L   CO2 23 22 - 32 mmol/L   Glucose, Bld 216 (H) 65 - 99 mg/dL   BUN 9 6 - 20 mg/dL   Creatinine, Ser 1.15 0.61 - 1.24 mg/dL   Calcium 7.5 (L) 8.9 - 10.3 mg/dL   GFR calc non Af Amer >60 >60 mL/min   GFR calc Af Amer >60 >60 mL/min    Comment: (NOTE) The eGFR has been calculated using the CKD EPI  equation. This calculation has not been validated in all clinical situations. eGFR's persistently <60 mL/min signify possible Chronic Kidney Disease.    Anion gap 5 5 - 15  Glucose, capillary     Status: Abnormal   Collection Time: 03/01/15  7:34 AM  Result Value Ref Range   Glucose-Capillary 190 (H) 65 - 99 mg/dL  Glucose, capillary     Status: Abnormal   Collection Time: 03/01/15 12:14 PM  Result Value Ref Range   Glucose-Capillary 191 (H) 65 - 99 mg/dL  Glucose, capillary     Status: Abnormal   Collection Time: 03/01/15  5:53 PM  Result Value Ref Range   Glucose-Capillary 200 (H) 65 - 99 mg/dL  Glucose, capillary     Status: Abnormal   Collection Time: 03/01/15  8:32 PM  Result Value Ref Range   Glucose-Capillary 158 (H) 65 - 99 mg/dL  Glucose, capillary     Status: Abnormal   Collection Time: 03/01/15 11:15 PM  Result Value Ref Range   Glucose-Capillary 146 (H) 65 - 99 mg/dL  Glucose, capillary     Status: Abnormal   Collection Time:  03/02/15  3:43 AM  Result Value Ref Range   Glucose-Capillary 143 (H) 65 - 99 mg/dL  Basic metabolic panel     Status: Abnormal   Collection Time: 03/02/15  4:45 AM  Result Value Ref Range   Sodium 138 135 - 145 mmol/L   Potassium 3.3 (L) 3.5 - 5.1 mmol/L   Chloride 106 101 - 111 mmol/L   CO2 23 22 - 32 mmol/L   Glucose, Bld 150 (H) 65 - 99 mg/dL   BUN 6 6 - 20 mg/dL   Creatinine, Ser 1.26 (H) 0.61 - 1.24 mg/dL   Calcium 8.2 (L) 8.9 - 10.3 mg/dL   GFR calc non Af Amer 60 (L) >60 mL/min   GFR calc Af Amer >60 >60 mL/min    Comment: (NOTE) The eGFR has been calculated using the CKD EPI equation. This calculation has not been validated in all clinical situations. eGFR's persistently <60 mL/min signify possible Chronic Kidney Disease.    Anion gap 9 5 - 15  Glucose, capillary     Status: Abnormal   Collection Time: 03/02/15  9:47 AM  Result Value Ref Range   Glucose-Capillary 160 (H) 65 - 99 mg/dL    Imaging / Studies: No results found.  Medications / Allergies:  Scheduled Meds: . antiseptic oral rinse  7 mL Mouth Rinse BID  . cloNIDine  0.2 mg Transdermal Weekly  . enalaprilat  1.25 mg Intravenous 4 times per day  . heparin subcutaneous  5,000 Units Subcutaneous 3 times per day  . insulin aspart  0-20 Units Subcutaneous 6 times per day  . insulin glargine  18 Units Subcutaneous BID  . metoprolol  10 mg Intravenous 4 times per day  . piperacillin-tazobactam (ZOSYN)  IV  3.375 g Intravenous Q8H   Continuous Infusions: . sodium chloride 75 mL/hr at 03/01/15 2319   PRN Meds:.bisacodyl, hydrALAZINE, methocarbamol (ROBAXIN)  IV, morphine injection, ondansetron **OR** ondansetron (ZOFRAN) IV, promethazine  Antibiotics: Anti-infectives    Start     Dose/Rate Route Frequency Ordered Stop   02/25/15 0200  piperacillin-tazobactam (ZOSYN) IVPB 3.375 g     3.375 g 12.5 mL/hr over 240 Minutes Intravenous Every 8 hours 02/25/15 0010     02/24/15 1745  piperacillin-tazobactam  (ZOSYN) IVPB 3.375 g     3.375 g 100 mL/hr over 30 Minutes Intravenous  Once  02/24/15 1734 02/24/15 1817       Assessment/Plan Perforated acute appendicitis with generalized peritonitis POD#6 laparoscopic appendectomy---Dr. Zella Richer Post operative ileus -ileus resolving, start on clear -ambulate, IS, pain control -continue drain care -will need staples removed prior to DC Acute v chronic renal insufficiency-sCr stable at 1.26.  Good UOP. DM-poorly controlled, A1c 7/7 9.3. CBGs high but improved. Appreciate medicine management.  HTN-appreciate IM management  VTE prophylaxis-SCD/heparin ID-zosyn D#6/7 FEN-replete K, labs in AM, clears and advance as tolerated.   Erby Pian, [facility] Pager [phone]([id number]:30P)   03/02/2015 11:11 AM

## 2015-03-03 ENCOUNTER — Inpatient Hospital Stay (HOSPITAL_COMMUNITY): Payer: Medicare Other

## 2015-03-03 DIAGNOSIS — I509 Heart failure, unspecified: Secondary | ICD-10-CM

## 2015-03-03 LAB — GLUCOSE, CAPILLARY
GLUCOSE-CAPILLARY: 159 mg/dL — AB (ref 65–99)
GLUCOSE-CAPILLARY: 170 mg/dL — AB (ref 65–99)
GLUCOSE-CAPILLARY: 202 mg/dL — AB (ref 65–99)
Glucose-Capillary: 147 mg/dL — ABNORMAL HIGH (ref 65–99)
Glucose-Capillary: 144 mg/dL — ABNORMAL HIGH (ref 65–99)
Glucose-Capillary: 134 mg/dL — ABNORMAL HIGH (ref 65–99)
Glucose-Capillary: 185 mg/dL — ABNORMAL HIGH (ref 65–99)

## 2015-03-03 LAB — BASIC METABOLIC PANEL
Anion gap: 10 (ref 5–15)
BUN: 8 mg/dL (ref 6–20)
CALCIUM: 8.4 mg/dL — AB (ref 8.9–10.3)
CO2: 24 mmol/L (ref 22–32)
CREATININE: 1.11 mg/dL (ref 0.61–1.24)
Chloride: 103 mmol/L (ref 101–111)
GFR calc Af Amer: >60 mL/min (ref >60)
GFR calc non Af Amer: >60 mL/min (ref >60)
Glucose, Bld: 151 mg/dL — ABNORMAL HIGH (ref 65–99)
Potassium: 3.1 mmol/L — ABNORMAL LOW (ref 3.5–5.1)
SODIUM: 137 mmol/L (ref 135–145)

## 2015-03-03 LAB — CBC
HCT: 35.7 % — ABNORMAL LOW (ref 39.0–52.0)
Hemoglobin: 12.4 g/dL — ABNORMAL LOW (ref 13.0–17.0)
MCH: 29.5 pg (ref 26.0–34.0)
MCHC: 34.7 g/dL (ref 30.0–36.0)
MCV: 84.8 fL (ref 78.0–100.0)
Platelets: 536 10*3/uL — ABNORMAL HIGH (ref 150–400)
RBC: 4.21 MIL/uL — ABNORMAL LOW (ref 4.22–5.81)
RDW: 12.8 % (ref 11.5–15.5)
WBC: 9.1 10*3/uL (ref 4.0–10.5)

## 2015-03-03 LAB — BRAIN NATRIURETIC PEPTIDE: B NATRIURETIC PEPTIDE 5: 89.5 pg/mL (ref 0.0–100.0)

## 2015-03-03 MED ORDER — AMLODIPINE BESYLATE 10 MG PO TABS
10.0000 mg | ORAL_TABLET | Freq: Every day | ORAL | Status: DC
  Administered 2015-03-03 – 2015-03-05 (×3): 10 mg via ORAL
  Filled 2015-03-03 (×3): qty 1

## 2015-03-03 MED ORDER — INSULIN GLARGINE 100 UNIT/ML ~~LOC~~ SOLN
25.0000 [IU] | Freq: Two times a day (BID) | SUBCUTANEOUS | Status: DC
  Administered 2015-03-04 – 2015-03-05 (×3): 25 [IU] via SUBCUTANEOUS
  Filled 2015-03-03 (×3): qty 0.25

## 2015-03-03 MED ORDER — HYDRALAZINE HCL 10 MG PO TABS
10.0000 mg | ORAL_TABLET | Freq: Three times a day (TID) | ORAL | Status: DC
  Administered 2015-03-03 – 2015-03-04 (×4): 10 mg via ORAL
  Filled 2015-03-03 (×7): qty 1

## 2015-03-03 MED ORDER — METOPROLOL TARTRATE 1 MG/ML IV SOLN
10.0000 mg | INTRAVENOUS | Status: DC
  Administered 2015-03-03 (×2): 10 mg via INTRAVENOUS
  Filled 2015-03-03 (×7): qty 10

## 2015-03-03 MED ORDER — POTASSIUM CHLORIDE CRYS ER 20 MEQ PO TBCR
40.0000 meq | EXTENDED_RELEASE_TABLET | Freq: Two times a day (BID) | ORAL | Status: AC
  Administered 2015-03-03 (×2): 40 meq via ORAL
  Filled 2015-03-03 (×2): qty 2

## 2015-03-03 MED ORDER — METOPROLOL SUCCINATE ER 50 MG PO TB24
50.0000 mg | ORAL_TABLET | Freq: Two times a day (BID) | ORAL | Status: DC
  Administered 2015-03-03 – 2015-03-05 (×5): 50 mg via ORAL
  Filled 2015-03-03 (×6): qty 1

## 2015-03-03 MED ORDER — HYDRALAZINE HCL 20 MG/ML IJ SOLN
20.0000 mg | INTRAMUSCULAR | Status: DC | PRN
Start: 2015-03-03 — End: 2015-03-05
  Administered 2015-03-03: 20 mg via INTRAVENOUS
  Filled 2015-03-03: qty 1

## 2015-03-03 MED ORDER — ENALAPRIL MALEATE 10 MG PO TABS
20.0000 mg | ORAL_TABLET | Freq: Two times a day (BID) | ORAL | Status: DC
  Administered 2015-03-03 – 2015-03-05 (×5): 20 mg via ORAL
  Filled 2015-03-03 (×3): qty 2
  Filled 2015-03-03 (×2): qty 1
  Filled 2015-03-03: qty 2

## 2015-03-03 MED ORDER — SPIRONOLACTONE 25 MG PO TABS
50.0000 mg | ORAL_TABLET | Freq: Every day | ORAL | Status: DC
  Administered 2015-03-03 – 2015-03-05 (×3): 50 mg via ORAL
  Filled 2015-03-03: qty 1
  Filled 2015-03-03 (×2): qty 2

## 2015-03-03 MED ORDER — CLONIDINE HCL 0.2 MG PO TABS
0.2000 mg | ORAL_TABLET | Freq: Three times a day (TID) | ORAL | Status: DC
  Administered 2015-03-03 – 2015-03-05 (×6): 0.2 mg via ORAL
  Filled 2015-03-03 (×8): qty 1

## 2015-03-03 MED ORDER — ENALAPRILAT 1.25 MG/ML IV SOLN
2.5000 mg | Freq: Four times a day (QID) | INTRAVENOUS | Status: DC
  Administered 2015-03-03: 2.5 mg via INTRAVENOUS
  Filled 2015-03-03 (×4): qty 2

## 2015-03-03 NOTE — Progress Notes (Signed)
*  PRELIMINARY RESULTS* Echocardiogram 2D Echocardiogram has been performed.  Richard Russell, Richard Russell 03/03/2015, 2:59 PM

## 2015-03-03 NOTE — Progress Notes (Signed)
7 Days Post-Op  Subjective: He says he feels better some flatus, OK with clears.  No BM, stomach less distended.  Objective: Vital signs in last 24 hours: Temp:  [98.4 F (36.9 C)-99.3 F (37.4 C)] 99 F (37.2 C) (07/12 0800) Pulse Rate:  [92-103] 92 (07/12 0800) Resp:  [16-20] 18 (07/12 0800) BP: (178-206)/(74-91) 193/91 mmHg (07/12 0800) SpO2:  [98 %-100 %] 98 % (07/12 0800) Last BM Date: 03/02/15 PO 180 ml Diet: clears Urine 1640 ml Drain 35 ml Afebrile, BP up K+ 3.1 creatinine is better WBC is normal Intake/Output from previous day: 07/11 0701 - 07/12 0700 In: 2106.7 [P.O.:180; I.V.:1826.7; IV Piggyback:100] Out: 1675 [Urine:1640; Drains:35] Intake/Output this shift: Total I/O In: -  Out: 405 [Urine:400; Drains:5]  General appearance: alert, cooperative and no distress Resp: clear to auscultation bilaterally GI: Few BS, site and staple line looks OK, drain is clear and he is having flatus.  Lab Results:   Recent Labs  03/03/15 0455  WBC 9.1  HGB 12.4*  HCT 35.7*  PLT 536*    BMET  Recent Labs  03/02/15 0445 03/03/15 0455  NA 138 137  K 3.3* 3.1*  CL 106 103  CO2 23 24  GLUCOSE 150* 151*  BUN 6 8  CREATININE 1.26* 1.11  CALCIUM 8.2* 8.4*   PT/INR No results for input(s): LABPROT, INR in the last 72 hours.   Recent Labs Lab 02/24/15 1530  AST 21  ALT 15*  ALKPHOS 70  BILITOT 1.7*  PROT 7.8  ALBUMIN 3.7     Lipase     Component Value Date/Time   LIPASE 18* 02/24/2015 1530     Studies/Results: No results found.  Medications: . antiseptic oral rinse  7 mL Mouth Rinse BID  . cloNIDine  0.2 mg Transdermal Weekly  . enalaprilat  2.5 mg Intravenous 4 times per day  . heparin subcutaneous  5,000 Units Subcutaneous 3 times per day  . insulin aspart  0-20 Units Subcutaneous 6 times per day  . insulin glargine  20 Units Subcutaneous BID  . metoprolol  10 mg Intravenous Q4H  . piperacillin-tazobactam (ZOSYN)  IV  3.375 g Intravenous  Q8H     Prior to Admission medications   Medication Sig Start Date End Date Taking? Authorizing Provider  amLODipine (NORVASC) 10 MG tablet Take 10 mg by mouth daily.   Yes Historical Provider, MD  atorvastatin (LIPITOR) 80 MG tablet Take 40 mg by mouth at bedtime.   Yes Historical Provider, MD  chlorthalidone (HYGROTON) 25 MG tablet Take 12.5 mg by mouth daily.   Yes Historical Provider, MD  cloNIDine (CATAPRES) 0.2 MG tablet Take 0.2 mg by mouth 3 (three) times daily.   Yes Historical Provider, MD  enalapril (VASOTEC) 20 MG tablet Take 20 mg by mouth 2 (two) times daily.   Yes Historical Provider, MD  gabapentin (NEURONTIN) 800 MG tablet Take 800 mg by mouth 3 (three) times daily.   Yes Historical Provider, MD  insulin aspart (NOVOLOG FLEXPEN) 100 UNIT/ML FlexPen Inject 4.6 Units into the skin 2 (two) times daily.   Yes Historical Provider, MD  Insulin Glargine (LANTUS SOLOSTAR) 100 UNIT/ML Solostar Pen Inject 5 Units into the skin 3 (three) times daily.   Yes Historical Provider, MD  magnesium hydroxide (MILK OF MAGNESIA) 400 MG/5ML suspension Take 30 mLs by mouth daily as needed for mild constipation.   Yes Historical Provider, MD  metoprolol succinate (TOPROL-XL) 50 MG 24 hr tablet Take 25 mg by mouth  2 (two) times daily. Take with or immediately following a meal.   Yes Historical Provider, MD  spironolactone (ALDACTONE) 25 MG tablet Take 50 mg by mouth daily.   Yes Historical Provider, MD     Assessment/Plan Perforated acute appendicitis with generalized peritonitis POD#7 laparoscopic appendectomy 02/24/15,---Dr. Abbey Chatters Post operative ileus -ileus resolving, advance to full liquids -ambulate, IS, pain control -continue drain care -will need staples removed prior to DC Acute v chronic renal insufficiency-sCr stable at 1.26. Good UOP. DM-poorly controlled, A1c 7/7 9.3. CBGs high but improved. Appreciate medicine management.  HTN-appreciate IM management  Body mass index is  37.97 kg/(m^2).  Hypokalemia VTE prophylaxis-SCD/heparin ID-zosyn D#7/7 FEN-replete K, labs in AM, clears and advance as tolerated.    Plan:  Full liquids, increase ambulation and Dr. Rhona Leavens will replace K+   LOS: 7 days    Katheryne Gorr 03/03/2015

## 2015-03-03 NOTE — Plan of Care (Signed)
Problem: Phase II Progression Outcomes Goal: Vital signs stable Outcome: Not Progressing Hypertension continues despite decrease in IV fluids and multiple medications. Will continue to monitor and administer PRN medications

## 2015-03-03 NOTE — Progress Notes (Signed)
[facility] PROGRESS NOTE  Richard Russell ZOX:[id number] DOB: 1950/09/05 DOA: 02/24/2015 PCP: No primary care provider on file.  Assessment/Plan: 1. DM2 1. Pt tolerated clears, advanced to full liquid today 2. Per med rec, was on 4.6 units aspart bid and 5 units lantus tid (pt reports BID lantus) prior to admit 3. Presenting glucose near 500's 4. Cont SSI coverage. 5. Thus far have increased lantus to 20 units BID 6. Glucose improving, latest glucose in the 130's 2. HTN 1. Had been on norvasc 10mg , clonidine 0.2mg  tid, enalapril 20mg , metoprolol 50mg  qday, spironolactone with added PO hydralazine on 7/9 however BP was found to be more difficult to control with concerns of poor absorption of PO meds. Thus, had d/c'd PO meds and change to IV ACEI, IV betablocker, and clonidine patch 2. Cont PRN hydralazine with added PRN labetalol IV 3. BP remains poorly controlled 4. As pt is now reliably tolerating PO, will resume PO BP meds 3. Renal insufficiency, unclear if acute or chronic 1. Renal function improved 4. Perforated appendicitis 1. Pt is s/p surgery 7/5 2. Cont as per primary service 3. Advancing diet  Procedures:  Appendectomy 7/5  HPI/Subjective: Feels better today. Tolerating diet  Objective: Filed Vitals:   03/03/15 1000 03/03/15 1153 03/03/15 1357 03/03/15 1429  BP: 192/93 197/88 185/78 190/88  Pulse: 97 86 65 82  Temp:      TempSrc:   Oral   Resp:   18   Height:      Weight:      SpO2:   99%     Intake/Output Summary (Last 24 hours) at 03/03/15 1450 Last data filed at 03/03/15 1430  Gross per 24 hour  Intake 2636.67 ml  Output   1895 ml  Net 741.67 ml   Filed Weights   02/24/15 1521 02/24/15 1524  Weight: 127.007 kg (280 lb) 127.007 kg (280 lb)    Exam:  General:  Awake, laying in bed,  in nad  Cardiovascular: regular, s1, s2  Respiratory: normal resp effort, no wheezing  Abdomen: soft,less distended, pos BS  Musculoskeletal: perfused, no  clubbing, no cyanosis  Data Reviewed: Basic Metabolic Panel:  Recent Labs Lab 02/27/15 0530 02/28/15 0525 03/01/15 0649 03/02/15 0445 03/03/15 0455  NA 139 138 136 138 137  K 3.9 4.0 3.5 3.3* 3.1*  CL 109 107 108 106 103  CO2 24 25 23 23 24   GLUCOSE 178* 202* 216* 150* 151*  BUN 13 13 9 6 8   CREATININE 1.36* 1.34* 1.15 1.26* 1.11  CALCIUM 7.6* 7.6* 7.5* 8.2* 8.4*   Liver Function Tests:  Recent Labs Lab 02/24/15 1530  AST 21  ALT 15*  ALKPHOS 70  BILITOT 1.7*  PROT 7.8  ALBUMIN 3.7    Recent Labs Lab 02/24/15 1530  LIPASE 18*   No results for input(s): AMMONIA in the last 168 hours. CBC:  Recent Labs Lab 02/24/15 1530 02/25/15 0440 02/26/15 0409 02/27/15 0530 03/03/15 0455  WBC 16.5* 14.2* 11.6* 10.7* 9.1  NEUTROABS 14.8*  --   --   --   --   HGB 13.6 10.9* 10.6* 10.1* 12.4*  HCT 38.0* 31.7* 30.4* 30.3* 35.7*  MCV 86.2 86.6 88.1 88.1 84.8  PLT 283 239 249 299 536*   Cardiac Enzymes: No results for input(s): CKTOTAL, CKMB, CKMBINDEX, TROPONINI in the last 168 hours. BNP (last 3 results)  Recent Labs  03/03/15 0455  BNP 89.5    ProBNP (last 3 results) No results for input(s): PROBNP  in the last 8760 hours.  CBG:  Recent Labs Lab 03/02/15 2003 03/03/15 0002 03/03/15 0421 03/03/15 0754 03/03/15 1201  GLUCAP 175* 159* 147* 144* 134*    No results found for this or any previous visit (from the past 240 hour(s)).   Studies: No results found.  Scheduled Meds: . amLODipine  10 mg Oral Daily  . antiseptic oral rinse  7 mL Mouth Rinse BID  . cloNIDine  0.2 mg Oral TID  . enalapril  20 mg Oral BID  . heparin subcutaneous  5,000 Units Subcutaneous 3 times per day  . hydrALAZINE  10 mg Oral 3 times per day  . insulin aspart  0-20 Units Subcutaneous 6 times per day  . insulin glargine  20 Units Subcutaneous BID  . metoprolol succinate  50 mg Oral BID  . piperacillin-tazobactam (ZOSYN)  IV  3.375 g Intravenous Q8H  . potassium chloride   40 mEq Oral BID  . spironolactone  50 mg Oral Daily   Continuous Infusions:    Active Problems:   Perforated appendicitis   DM2 (diabetes mellitus, type 2)   HTN (hypertension)   Diabetes mellitus type II, uncontrolled    Legacy Carrender K  [facility] Pager [phone]. If 7PM-7AM, please contact night-coverage at [email or web address], password [facility] 03/03/2015, 2:50 PM  LOS: 7 days

## 2015-03-03 NOTE — Plan of Care (Signed)
Problem: Phase II Progression Outcomes Goal: Tolerating diet Outcome: Progressing Able to maintain clear liquid diet and now advanced to full liquids. Will continue to monitor for Nausea and Vomiting.

## 2015-03-04 DIAGNOSIS — I10 Essential (primary) hypertension: Secondary | ICD-10-CM

## 2015-03-04 DIAGNOSIS — K352 Acute appendicitis with generalized peritonitis: Secondary | ICD-10-CM

## 2015-03-04 DIAGNOSIS — E1165 Type 2 diabetes mellitus with hyperglycemia: Secondary | ICD-10-CM

## 2015-03-04 LAB — BASIC METABOLIC PANEL
ANION GAP: 9 (ref 5–15)
BUN: 8 mg/dL (ref 6–20)
CO2: 26 mmol/L (ref 22–32)
Calcium: 8.3 mg/dL — ABNORMAL LOW (ref 8.9–10.3)
Chloride: 104 mmol/L (ref 101–111)
Creatinine, Ser: 1.29 mg/dL — ABNORMAL HIGH (ref 0.61–1.24)
GFR calc Af Amer: >60 mL/min (ref >60)
GFR calc non Af Amer: 58 mL/min — ABNORMAL LOW (ref >60)
Glucose, Bld: 153 mg/dL — ABNORMAL HIGH (ref 65–99)
Potassium: 3.3 mmol/L — ABNORMAL LOW (ref 3.5–5.1)
SODIUM: 139 mmol/L (ref 135–145)

## 2015-03-04 LAB — GLUCOSE, CAPILLARY
GLUCOSE-CAPILLARY: 129 mg/dL — AB (ref 65–99)
GLUCOSE-CAPILLARY: 146 mg/dL — AB (ref 65–99)
GLUCOSE-CAPILLARY: 154 mg/dL — AB (ref 65–99)
Glucose-Capillary: 218 mg/dL — ABNORMAL HIGH (ref 65–99)
Glucose-Capillary: 216 mg/dL — ABNORMAL HIGH (ref 65–99)
Glucose-Capillary: 199 mg/dL — ABNORMAL HIGH (ref 65–99)

## 2015-03-04 MED ORDER — HYDRALAZINE HCL 25 MG PO TABS
25.0000 mg | ORAL_TABLET | Freq: Three times a day (TID) | ORAL | Status: DC
  Administered 2015-03-04 – 2015-03-05 (×3): 25 mg via ORAL
  Filled 2015-03-04 (×5): qty 1

## 2015-03-04 MED ORDER — POTASSIUM CHLORIDE CRYS ER 20 MEQ PO TBCR
40.0000 meq | EXTENDED_RELEASE_TABLET | Freq: Once | ORAL | Status: AC
  Administered 2015-03-04: 40 meq via ORAL
  Filled 2015-03-04: qty 2

## 2015-03-04 NOTE — Progress Notes (Signed)
[facility] PROGRESS NOTE  Richard MoseDivie E Russell ZOX:[id number] DOB: Aug 22, 1951 DOA: 02/24/2015 PCP: No primary care provider on file.  Assessment/Plan: 1. Diabetes mellitus- patient started back on Lantus and sliding scale insulin. Blood glucose is well controlled. Continue Lantus 20 units twice a day 2. Hypertension- patient restarted on home medications including Norvasc, Catapres, enalapril, metoprolol, spironolactone and started on hydralazine. Will increase the dose of hydralazine 25 mg by mouth 3 times a day. Patient also was taking chlorthalidone at home which has been holding to the worsening renal function. 3. 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 The UA shows proteinuria. Patient will need further workup as outpatient. Patient presented with creatinine of 1.83 on 02/24/2015 4. Perforated appendicitis- status post surgery 02/24/2015. Gen. surgery following 5. Hypokalemia- replace potassium and check BMP in a.m.    HPI/Subjective: Patient seen and examined, denies any complaints today. Denies chest pain or shortness of breath. Blood sugar is well controlled. Patient says that his blood pressure has been labile for long time.  Objective: Filed Vitals:   03/04/15 1400  BP: 187/67  Pulse: 75  Temp: 98.2 F (36.8 C)  Resp: 18    Intake/Output Summary (Last 24 hours) at 03/04/15 1615 Last data filed at 03/04/15 1400  Gross per 24 hour  Intake     50 ml  Output    850 ml  Net   -800 ml   Filed Weights   02/24/15 1521 02/24/15 1524  Weight: 127.007 kg (280 lb) 127.007 kg (280 lb)    Exam:   General:  Appearing no acute distress  Cardiovascular: S1-S2 is regular  Respiratory: Clear to auscultation bilaterally  Abdomen: Soft, nontender, no organomegaly  Musculoskeletal: No edema of the lower extremities noted.   Data Reviewed: Basic Metabolic Panel:  Recent Labs Lab 02/28/15 0525 03/01/15 0649 03/02/15 0445  03/03/15 0455 03/04/15 0550  NA 138 136 138 137 139  K 4.0 3.5 3.3* 3.1* 3.3*  CL 107 108 106 103 104  CO2 25 23 23 24 26   GLUCOSE 202* 216* 150* 151* 153*  BUN 13 9 6 8 8   CREATININE 1.34* 1.15 1.26* 1.11 1.29*  CALCIUM 7.6* 7.5* 8.2* 8.4* 8.3*   Liver Function Tests: No results for input(s): AST, ALT, ALKPHOS, BILITOT, PROT, ALBUMIN in the last 168 hours. No results for input(s): LIPASE, AMYLASE in the last 168 hours. No results for input(s): AMMONIA in the last 168 hours. CBC:  Recent Labs Lab 02/26/15 0409 02/27/15 0530 03/03/15 0455  WBC 11.6* 10.7* 9.1  HGB 10.6* 10.1* 12.4*  HCT 30.4* 30.3* 35.7*  MCV 88.1 88.1 84.8  PLT 249 299 536*   Cardiac Enzymes: No results for input(s): CKTOTAL, CKMB, CKMBINDEX, TROPONINI in the last 168 hours. BNP (last 3 results)  Recent Labs  03/03/15 0455  BNP 89.5    ProBNP (last 3 results) No results for input(s): PROBNP in the last 8760 hours.  CBG:  Recent Labs Lab 03/03/15 1943 03/03/15 2334 03/04/15 0327 03/04/15 0736 03/04/15 1234  GLUCAP 185* 202* 146* 129* 218*    No results found for this or any previous visit (from the past 240 hour(s)).   Studies: No results found.  Scheduled Meds: . amLODipine  10 mg Oral Daily  . antiseptic oral rinse  7 mL Mouth Rinse BID  . cloNIDine  0.2 mg Oral TID  . enalapril  20 mg Oral BID  . heparin subcutaneous  5,000 Units Subcutaneous 3 times  per day  . hydrALAZINE  25 mg Oral 3 times per day  . insulin aspart  0-20 Units Subcutaneous 6 times per day  . insulin glargine  25 Units Subcutaneous BID  . metoprolol succinate  50 mg Oral BID  . spironolactone  50 mg Oral Daily   Continuous Infusions:   Active Problems:   Perforated appendicitis   DM2 (diabetes mellitus, type 2)   HTN (hypertension)   Diabetes mellitus type II, uncontrolled    Time spent: 25 min    [facility]  [facility] Pager [phone]. If 7PM-7AM, please contact night-coverage at  [email or web address], password [facility] 03/04/2015, 4:15 PM  LOS: 8 days

## 2015-03-04 NOTE — Discharge Summary (Signed)
Physician Discharge Summary  Patient ID: Richard Russell MRN: [id number] DOB/AGE: [id number] 62 y.o.  Admit date: 02/24/2015 Discharge date: 03/05/2015  Admission Diagnoses:  Acute appendicitis with possible rupture AODM poor control Hypertension Hypercholesterolemia Body mass index is 37.97    Discharge Diagnoses:  Perforated acute appendicitis with generalized peritonitis Post op ileus Acute on chronic renal insufficiency- Hypertension  AODM with poor control   Active Problems:   Perforated appendicitis   DM2 (diabetes mellitus, type 2)   HTN (hypertension)   Diabetes mellitus type II, uncontrolled   PROCEDURES: laparoscopic appendectomy 02/24/15,---Dr. Huntley Dec Course:  This is a 64 year old male transferred from [facility] because of acute appendicitis. 8 days ago, he began having vague periumbilical pain. Yesterday, the pain radiated to the right lower quadrant and became more intense. The pain was associated with fever as well as nausea and vomiting. He presented to the [facility] and was evaluated. He was noted to have a leukocytosis. CT scan demonstrated findings consistent with acute appendicitis. There is some free fluid in the right pelvic area as well. He was transferred here for further treatment. He has received IV Zosyn. His initial blood sugar was greater than 500.  He was seen and taken to the OR by Dr. Abbey Chatters.  Medicine was called to help with medical management of his diabetes and hypertension.  Post op he had an expected ileus, issue with glucose control and his hypertension  He has base line renal disease and this was somewhat worse with his diabetes and sepsis from the perforated appendix. As his ileus resolved we advanced his diet. His drainage from the JP became clear.  He was maintained on IV Zosyn for 7 days. If his medical issues are stable and WBC remains normal off antibiotics we plan discharge in AM 03/05/15.   Dr. Sharl Ma has made a final adjustment with his medicines and given him prescriptions for this.  He is to call the [place], and get an appointment in 1-2 weeks so everyone is aware of changes.  He is to start new BP meds today at home as instructed.  Drain and staples removed.  Condition on d/c:  Improving      Disposition: Final discharge disposition not confirmed     Medication List    STOP taking these medications        chlorthalidone 25 MG tablet  Commonly known as:  HYGROTON      TAKE these medications        acetaminophen 325 MG tablet  Commonly known as:  TYLENOL  Take 2 tablets (650 mg total) by mouth every 6 (six) hours as needed.     amLODipine 10 MG tablet  Commonly known as:  NORVASC  Take 10 mg by mouth daily.     atorvastatin 80 MG tablet  Commonly known as:  LIPITOR  Take 40 mg by mouth at bedtime.     cloNIDine 0.2 MG tablet  Commonly known as:  CATAPRES  Take 0.2 mg by mouth 3 (three) times daily.     enalapril 20 MG tablet  Commonly known as:  VASOTEC  Take 20 mg by mouth 2 (two) times daily.     gabapentin 800 MG tablet  Commonly known as:  NEURONTIN  Take 800 mg by mouth 3 (three) times daily.     hydrALAZINE 25 MG tablet  Commonly known as:  APRESOLINE  Take 1 tablet (25 mg total) by mouth every 8 (eight)  hours.     LANTUS SOLOSTAR 100 UNIT/ML Solostar Pen  Generic drug:  Insulin Glargine  Inject 5 Units into the skin 3 (three) times daily.     magnesium hydroxide 400 MG/5ML suspension  Commonly known as:  MILK OF MAGNESIA  Take 30 mLs by mouth daily as needed for mild constipation.     metoprolol succinate 50 MG 24 hr tablet  Commonly known as:  TOPROL-XL  Take 1 tablet (50 mg total) by mouth 2 (two) times daily. Take with or immediately following a meal.     NOVOLOG FLEXPEN 100 UNIT/ML FlexPen  Generic drug:  insulin aspart  Inject 4.6 Units into the skin 2 (two) times daily.     oxyCODONE-acetaminophen 5-325 MG per tablet  Commonly  known as:  ROXICET  Take 1-2 tablets by mouth every 6 (six) hours as needed for severe pain.     spironolactone 25 MG tablet  Commonly known as:  ALDACTONE  Take 50 mg by mouth daily.       Follow-up Information    Follow up with ROSENBOWER,TODD J, MD. Schedule an appointment as soon as possible for a visit in 2 weeks.   Specialty:  General Surgery   Why:  Make an appointment in 2-3 weeks.  Sooner if you have an issue   Contact information:   [address] [place] [address] [phone]       [facility] and get an appointment next 1-2 weeks. Schedule an appointment as soon as possible for a visit in 1 week.   Why:  call for follow up of your blood pressure, diabetes control,  and renal function.  Get the new medicines for your blood pressure today and start using them.      SignedSherrie George: Chanel Mcadams 03/05/2015, 9:26 AM

## 2015-03-04 NOTE — Progress Notes (Signed)
8 Days Post-Op  Subjective: He is feeling better, just had a small Bm.  Wounds OK drainage is clear.  Issues include BP, rising creatinine.  Objective: Vital signs in last 24 hours: Temp:  [99 F (37.2 C)-99.6 F (37.6 C)] 99 F (37.2 C) (07/13 0511) Pulse Rate:  [65-97] 72 (07/13 0511) Resp:  [18] 18 (07/13 0511) BP: (160-197)/(65-93) 162/65 mmHg (07/13 0511) SpO2:  [94 %-99 %] 94 % (07/13 0511) Last BM Date: 03/02/15 480 PO NO BM recorded Full liquids Afebrile, BP up some K+ 3.3 Creatinine is worse Intake/Output from previous day: 07/12 0701 - 07/13 0700 In: 580 [P.O.:480; IV Piggyback:100] Out: 1785 [Urine:1775; Drains:10] Intake/Output this shift:    General appearance: alert, cooperative and no distress Resp: clear to auscultation bilaterally Abd:  Large abdomen, wound ok, he just had a BM.  Drainage is clear.  Lab Results:   Recent Labs  03/03/15 0455  WBC 9.1  HGB 12.4*  HCT 35.7*  PLT 536*    BMET  Recent Labs  03/03/15 0455 03/04/15 0550  NA 137 139  K 3.1* 3.3*  CL 103 104  CO2 24 26  GLUCOSE 151* 153*  BUN 8 8  CREATININE 1.11 1.29*  CALCIUM 8.4* 8.3*   PT/INR No results for input(s): LABPROT, INR in the last 72 hours.  No results for input(s): AST, ALT, ALKPHOS, BILITOT, PROT, ALBUMIN in the last 168 hours.   Lipase     Component Value Date/Time   LIPASE 18* 02/24/2015 1530     Studies/Results: No results found.  Medications: . amLODipine  10 mg Oral Daily  . antiseptic oral rinse  7 mL Mouth Rinse BID  . cloNIDine  0.2 mg Oral TID  . enalapril  20 mg Oral BID  . heparin subcutaneous  5,000 Units Subcutaneous 3 times per day  . hydrALAZINE  10 mg Oral 3 times per day  . insulin aspart  0-20 Units Subcutaneous 6 times per day  . insulin glargine  25 Units Subcutaneous BID  . metoprolol succinate  50 mg Oral BID  . piperacillin-tazobactam (ZOSYN)  IV  3.375 g Intravenous Q8H  . spironolactone  50 mg Oral Daily   NO IV  fluids currently  Assessment/Plan Perforated acute appendicitis with generalized peritonitis POD#8 laparoscopic appendectomy 02/24/15,---Dr. Abbey Chattersosenbower Post operative ileus -ileus resolving, advance to full liquids -ambulate, IS, pain control -continue drain care -will need staples removed prior to DC Acute v chronic renal insufficiency-sCr stable at 1.26. Good UOP. DM-poorly controlled, A1c 7/7 9.3. CBGs high but improved. Appreciate medicine management.  HTN-appreciate IM management  Body mass index is 37.97 kg/(m^2).  Hypokalemia VTE prophylaxis-SCD/heparin ID-zosyn D#7/7 FEN-replete K, labs in AM, clears and advance as tolerated.    Plan:  Carb modified diet, stop antibiotics, recheck labs in AM,  I am concerned about his creatinine going up but will defer to Medicine.  They are following and he will have a new Hospitalist today.  If he is doing well he may be able to go home tomorrow, if medically stable.  I will plan go get staples and drain out at that time.   LOS: 8 days    Afrika Brick 03/04/2015

## 2015-03-04 NOTE — Discharge Instructions (Signed)
Laparoscopic Appendectomy °Care After °Refer to this sheet in the next few weeks. These instructions provide you with information on caring for yourself after your procedure. Your caregiver may also give you more specific instructions. Your treatment has been planned according to current medical practices, but problems sometimes occur. Call your caregiver if you have any problems or questions after your procedure. °HOME CARE INSTRUCTIONS °· Do not drive while taking narcotic pain medicines. °· Use stool softener if you become constipated from your pain medicines. °· Change your bandages (dressings) as directed. °· Keep your wounds clean and dry. You may wash the wounds gently with soap and water. Gently pat the wounds dry with a clean towel. °· Do not take baths, swim, or use hot tubs for 10 days, or as instructed by your caregiver. °· Only take over-the-counter or prescription medicines for pain, discomfort, or fever as directed by your caregiver. °· You may continue your normal diet as directed. °· Do not lift more than 10 pounds (4.5 kg) or play contact sports for 3 weeks, or as directed. °· Slowly increase your activity after surgery. °· Take deep breaths to avoid getting a lung infection (pneumonia). °SEEK MEDICAL CARE IF: °· You have redness, swelling, or increasing pain in your wounds. °· You have pus coming from your wounds. °· You have drainage from a wound that lasts longer than 1 day. °· You notice a bad smell coming from the wounds or dressing. °· Your wound edges break open after stitches (sutures) have been removed. °· You notice increasing pain in the shoulders (shoulder strap areas) or near your shoulder blades. °· You develop dizzy episodes or fainting while standing. °· You develop shortness of breath. °· You develop persistent nausea or vomiting. °· You cannot control your bowel functions or lose your appetite. °· You develop diarrhea. °SEEK IMMEDIATE MEDICAL CARE IF:  °· You have a fever. °· You  develop a rash. °· You have difficulty breathing or sharp pains in your chest. °· You develop any reaction or side effects to medicines given. °MAKE SURE YOU: °· Understand these instructions. °· Will watch your condition. °· Will get help right away if you are not doing well or get worse. °Document Released: 08/08/2005 Document Revised: 10/31/2011 Document Reviewed: 02/15/2011 °ExitCare® Patient Information ©2015 [employer]. This information is not intended to replace advice given to you by your health care provider. Make sure you discuss any questions you have with your health care provider. °CCS ______[facility] °LAPAROSCOPIC SURGERY: POST OP INSTRUCTIONS °Always review your discharge instruction sheet given to you by the facility where your surgery was performed. °IF YOU HAVE DISABILITY OR FAMILY LEAVE FORMS, YOU MUST BRING THEM TO THE OFFICE FOR PROCESSING.   °DO NOT GIVE THEM TO YOUR DOCTOR. ° °1. A prescription for pain medication may be given to you upon discharge.  Take your pain medication as prescribed, if needed.  If narcotic pain medicine is not needed, then you may take acetaminophen (Tylenol) or ibuprofen (Advil) as needed. °2. Take your usually prescribed medications unless otherwise directed. °3. If you need a refill on your pain medication, please contact your pharmacy.  They will contact our office to request authorization. Prescriptions will not be filled after 5pm or on week-ends. °4. You should follow a light diet the first few days after arrival home, such as soup and crackers, etc.  Be sure to include lots of fluids daily. °5. Most patients will experience some swelling and bruising   in the area of the incisions.  Ice packs will help.  Swelling and bruising can take several days to resolve.  °6. It is common to experience some constipation if taking pain medication after surgery.  Increasing fluid intake and taking a stool softener (such as Colace) will usually help or  prevent this problem from occurring.  A mild laxative (Milk of Magnesia or Miralax) should be taken according to package instructions if there are no bowel movements after 48 hours. °7. Unless discharge instructions indicate otherwise, you may remove your bandages 24-48 hours after surgery, and you may shower at that time.  You may have steri-strips (small skin tapes) in place directly over the incision.  These strips should be left on the skin for 7-10 days.  If your surgeon used skin glue on the incision, you may shower in 24 hours.  The glue will flake off over the next 2-3 weeks.  Any sutures or staples will be removed at the office during your follow-up visit. °8. ACTIVITIES:  You may resume regular (light) daily activities beginning the next day--such as daily self-care, walking, climbing stairs--gradually increasing activities as tolerated.  You may have sexual intercourse when it is comfortable.  Refrain from any heavy lifting or straining until approved by your doctor. °a. You may drive when you are no longer taking prescription pain medication, you can comfortably wear a seatbelt, and you can safely maneuver your car and apply brakes. °b. RETURN TO WORK:  __________________________________________________________ °9. You should see your doctor in the office for a follow-up appointment approximately 2-3 weeks after your surgery.  Make sure that you call for this appointment within a day or two after you arrive home to insure a convenient appointment time. °10. OTHER INSTRUCTIONS: __________________________________________________________________________________________________________________________ __________________________________________________________________________________________________________________________ °WHEN TO CALL YOUR DOCTOR: °1. Fever over 101.0 °2. Inability to urinate °3. Continued bleeding from incision. °4. Increased pain, redness, or drainage from the incision. °5. Increasing  abdominal pain ° °The clinic staff is available to answer your questions during regular business hours.  Please don’t hesitate to call and ask to speak to one of the nurses for clinical concerns.  If you have a medical emergency, go to the nearest emergency room or call 911.  A surgeon from [facility] is always on call at the hospital. °[address], [address], [place], [place]  [address] ? [address] [address], [place], [place]   [address] °[phone] ? [phone] ? FAX [phone] °Web site: [email or web address] ° °

## 2015-03-05 LAB — BASIC METABOLIC PANEL
Anion gap: 7 (ref 5–15)
BUN: 7 mg/dL (ref 6–20)
CHLORIDE: 108 mmol/L (ref 101–111)
CO2: 25 mmol/L (ref 22–32)
Calcium: 8.6 mg/dL — ABNORMAL LOW (ref 8.9–10.3)
Creatinine, Ser: 1.17 mg/dL (ref 0.61–1.24)
GFR calc Af Amer: >60 mL/min (ref >60)
GFR calc non Af Amer: >60 mL/min (ref >60)
Glucose, Bld: 113 mg/dL — ABNORMAL HIGH (ref 65–99)
POTASSIUM: 3.4 mmol/L — AB (ref 3.5–5.1)
Sodium: 140 mmol/L (ref 135–145)

## 2015-03-05 LAB — CBC
HCT: 30.6 % — ABNORMAL LOW (ref 39.0–52.0)
Hemoglobin: 10.5 g/dL — ABNORMAL LOW (ref 13.0–17.0)
MCH: 29.3 pg (ref 26.0–34.0)
MCHC: 34.3 g/dL (ref 30.0–36.0)
MCV: 85.5 fL (ref 78.0–100.0)
PLATELETS: 496 10*3/uL — AB (ref 150–400)
RBC: 3.58 MIL/uL — AB (ref 4.22–5.81)
RDW: 12.7 % (ref 11.5–15.5)
WBC: 9 10*3/uL (ref 4.0–10.5)

## 2015-03-05 LAB — GLUCOSE, CAPILLARY
GLUCOSE-CAPILLARY: 131 mg/dL — AB (ref 65–99)
Glucose-Capillary: 106 mg/dL — ABNORMAL HIGH (ref 65–99)

## 2015-03-05 MED ORDER — HYDRALAZINE HCL 25 MG PO TABS: 25.0000 mg | ORAL_TABLET | Freq: Three times a day (TID) | ORAL | Status: DC

## 2015-03-05 MED ORDER — OXYCODONE-ACETAMINOPHEN 5-325 MG PO TABS: 1.0000 | ORAL_TABLET | ORAL | Status: DC | PRN

## 2015-03-05 MED ORDER — ACETAMINOPHEN 325 MG PO TABS: 650.0000 mg | ORAL_TABLET | Freq: Four times a day (QID) | ORAL | Status: DC | PRN

## 2015-03-05 MED ORDER — OXYCODONE-ACETAMINOPHEN 5-325 MG PO TABS: 1.0000 | ORAL_TABLET | Freq: Four times a day (QID) | ORAL | Status: DC | PRN

## 2015-03-05 MED ORDER — POTASSIUM CHLORIDE CRYS ER 20 MEQ PO TBCR
40.0000 meq | EXTENDED_RELEASE_TABLET | Freq: Once | ORAL | Status: AC
  Administered 2015-03-05: 40 meq via ORAL
  Filled 2015-03-05: qty 2

## 2015-03-05 MED ORDER — METOPROLOL SUCCINATE ER 50 MG PO TB24: 50.0000 mg | ORAL_TABLET | Freq: Two times a day (BID) | ORAL | Status: DC

## 2015-03-05 NOTE — Progress Notes (Signed)
Patient's vitals, incision and pain WNL. Patient is tolerating his diet and is ambulating. Discussed discharge instructions with patient. Had no complaints nor concerns. Will discharge to home.

## 2015-03-05 NOTE — Progress Notes (Signed)
9 Days Post-Op  Subjective: Richard Russell, Richard Russell, Richard Russell.  Tolerating diet, but not eating allot yet.   Objective: Vital signs in last 24 hours: Temp:  [98.2 F (36.8 C)-99.4 F (37.4 C)] 98.6 F (37 C) (07/14 0634) Pulse Rate:  [69-75] 75 (07/14 0634) Resp:  [18] 18 (07/14 0634) BP: (139-187)/(67-78) 164/76 mmHg (07/14 0634) SpO2:  [97 %-99 %] 97 % (07/14 0634) Last BM Date: 03/05/15 1080 PO recorded 2 ml from the drain No BM recorded Afebrile, VSS K+ 3.4, Richard Russell is replacing WBC 9.0 Intake/Output from previous day: 07/13 0701 - 07/14 0700 In: 1080 [P.O.:1080] Out: 1252 [Urine:1250; Drains:2] Intake/Output this shift:    General appearance: alert, cooperative and no distress Resp: clear to auscultation bilaterally GI: large abdomen, sore, but OK, wounds OK, staples and drain removed steri strips applied.  + BS and BM    Lab Results:   Recent Labs  03/03/15 0455 03/05/15 0530  WBC 9.1 9.0  HGB 12.4* 10.5*  HCT 35.7* 30.6*  PLT 536* 496*    BMET  Recent Labs  03/04/15 0550 03/05/15 0530  NA 139 140  K 3.3* 3.4*  CL 104 108  CO2 26 25  GLUCOSE 153* 113*  BUN 8 7  CREATININE 1.29* 1.17  CALCIUM 8.3* 8.6*   PT/INR No results for input(s): LABPROT, INR in the last 72 hours.  No results for input(s): AST, ALT, ALKPHOS, BILITOT, PROT, ALBUMIN in the last 168 hours.   Lipase     Component Value Date/Time   LIPASE 18* 02/24/2015 1530     Studies/Results: No results found.  Medications: . amLODipine  10 mg Oral Daily  . antiseptic oral rinse  7 mL Mouth Rinse BID  . cloNIDine  0.2 mg Oral TID  . enalapril  20 mg Oral BID  . heparin subcutaneous  5,000 Units Subcutaneous 3 times per day  . hydrALAZINE  25 mg Oral 3 times per day  . insulin aspart  0-20 Units Subcutaneous 6 times per day  . insulin glargine  25 Units Subcutaneous BID  . metoprolol succinate  50 mg Oral BID  . spironolactone  50 mg  Oral Daily    Assessment/Plan Perforated acute appendicitis with generalized peritonitis POD#9 laparoscopic appendectomy 02/24/15,---Richard Russell Post operative ileus Acute v chronic renal insufficiency-sCr stable at 1.26. Russell UOP. DM-poorly controlled, A1c 7/7 9.3. CBGs high but improved. Appreciate medicine management.  HTN-appreciate IM management  Body mass index is 37.97 kg/(m^2).  Hypokalemia VTE prophylaxis-SCD/heparin ID-zosyn D#7/7 completed 03/04/15 FEN-replete K, carb modified    Plan:  Richard Russell has adjusted his BP meds and given him prescriptions for this.  I told him Richard should get them fill today and use, not wait till his [facility] follow Russell.  We plan discharge today after family is available.    LOS: 9 days    Richard Russell 03/05/2015

## 2015-03-05 NOTE — Progress Notes (Signed)
[facility] PROGRESS NOTE  Aaron MoseDivie E Elling ONG:[id number] DOB: 27-Jan-1951 DOA: 02/24/2015 PCP: No primary care provider on file.  Assessment/Plan: 1. Diabetes mellitus- patient started back on Lantus and sliding scale insulin. Blood glucose is well controlled, he can continue with his home regimen of lantus and novolog and follow up with his PCP 2. Hypertension- patient restarted on home medications including Norvasc, Catapres, enalapril, metoprolol, spironolactone and started on hydralazine. Will increase the dose of hydralazine 25 mg by mouth 3 times a day. Patient also was taking chlorthalidone at home which has been holding to the worsening renal function. Will discharge on higher dose of Toprol 50 mg po bid along with Hydralzine 25 mg po TID 3. 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 The UA shows proteinuria. Patient will need further workup as outpatient. Patient presented with creatinine of 1.83 on 02/24/2015 4. Perforated appendicitis- status post surgery 02/24/2015. Gen. surgery following 5. Hypokalemia- potassium is 3.4 today will give another dose of Kdur 40 meq po x 1.  I have adjusted the BP medications in the discharge med reconciliation.     HPI/Subjective: Patient seen and examined, denies any complaints today. Might go home today  Objective: Filed Vitals:   03/05/15 0634  BP: 164/76  Pulse: 75  Temp: 98.6 F (37 C)  Resp: 18    Intake/Output Summary (Last 24 hours) at 03/05/15 0954 Last data filed at 03/05/15 [id number]  Gross per 24 hour  Intake   1080 ml  Output   1252 ml  Net   -172 ml   Filed Weights   02/24/15 1521 02/24/15 1524  Weight: 127.007 kg (280 lb) 127.007 kg (280 lb)    Exam:   General:  Appearing no acute distress  Cardiovascular: S1-S2 is regular  Respiratory: Clear to auscultation bilaterally  Abdomen: Soft, nontender, no organomegaly  Musculoskeletal: No edema of the lower extremities  noted.   Data Reviewed: Basic Metabolic Panel:  Recent Labs Lab 03/01/15 0649 03/02/15 0445 03/03/15 0455 03/04/15 0550 03/05/15 0530  NA 136 138 137 139 140  K 3.5 3.3* 3.1* 3.3* 3.4*  CL 108 106 103 104 108  CO2 23 23 24 26 25   GLUCOSE 216* 150* 151* 153* 113*  BUN 9 6 8 8 7   CREATININE 1.15 1.26* 1.11 1.29* 1.17  CALCIUM 7.5* 8.2* 8.4* 8.3* 8.6*   Liver Function Tests: No results for input(s): AST, ALT, ALKPHOS, BILITOT, PROT, ALBUMIN in the last 168 hours. No results for input(s): LIPASE, AMYLASE in the last 168 hours. No results for input(s): AMMONIA in the last 168 hours. CBC:  Recent Labs Lab 02/27/15 0530 03/03/15 0455 03/05/15 0530  WBC 10.7* 9.1 9.0  HGB 10.1* 12.4* 10.5*  HCT 30.3* 35.7* 30.6*  MCV 88.1 84.8 85.5  PLT 299 536* 496*   Cardiac Enzymes: No results for input(s): CKTOTAL, CKMB, CKMBINDEX, TROPONINI in the last 168 hours. BNP (last 3 results)  Recent Labs  03/03/15 0455  BNP 89.5    ProBNP (last 3 results) No results for input(s): PROBNP in the last 8760 hours.  CBG:  Recent Labs Lab 03/04/15 1715 03/04/15 1940 03/04/15 2339 03/05/15 0322 03/05/15 0736  GLUCAP 216* 199* 154* 106* 131*    No results found for this or any previous visit (from the past 240 hour(s)).   Studies: No results found.  Scheduled Meds: . amLODipine  10 mg Oral Daily  . antiseptic oral rinse  7 mL Mouth Rinse BID  .  cloNIDine  0.2 mg Oral TID  . enalapril  20 mg Oral BID  . heparin subcutaneous  5,000 Units Subcutaneous 3 times per day  . hydrALAZINE  25 mg Oral 3 times per day  . insulin aspart  0-20 Units Subcutaneous 6 times per day  . insulin glargine  25 Units Subcutaneous BID  . metoprolol succinate  50 mg Oral BID  . spironolactone  50 mg Oral Daily   Continuous Infusions:   Active Problems:   Perforated appendicitis   DM2 (diabetes mellitus, type 2)   HTN (hypertension)   Diabetes mellitus type II, uncontrolled    Time spent:  25 min    [facility]  [facility] Pager [phone]. If 7PM-7AM, please contact night-coverage at [email or web address], password [facility] 03/05/2015, 9:54 AM  LOS: 9 days

## 2017-11-16 ENCOUNTER — Encounter (HOSPITAL_BASED_OUTPATIENT_CLINIC_OR_DEPARTMENT_OTHER): Payer: Self-pay

## 2017-11-16 ENCOUNTER — Emergency Department (HOSPITAL_BASED_OUTPATIENT_CLINIC_OR_DEPARTMENT_OTHER)
Admission: EM | Admit: 2017-11-16 | Discharge: 2017-11-16 | Disposition: A | Discharge status: Home / Self Care | Payer: No Typology Code available for payment source | Attending: Emergency Medicine | Admitting: Emergency Medicine

## 2017-11-16 ENCOUNTER — Other Ambulatory Visit: Payer: Self-pay

## 2017-11-16 ENCOUNTER — Emergency Department (HOSPITAL_BASED_OUTPATIENT_CLINIC_OR_DEPARTMENT_OTHER): Payer: No Typology Code available for payment source

## 2017-11-16 DIAGNOSIS — R079 Chest pain, unspecified: Secondary | ICD-10-CM | POA: Diagnosis not present

## 2017-11-16 DIAGNOSIS — S161XXA Strain of muscle, fascia and tendon at neck level, initial encounter: Secondary | ICD-10-CM | POA: Diagnosis not present

## 2017-11-16 DIAGNOSIS — Y999 Unspecified external cause status: Secondary | ICD-10-CM | POA: Insufficient documentation

## 2017-11-16 DIAGNOSIS — Y92410 Unspecified street and highway as the place of occurrence of the external cause: Secondary | ICD-10-CM | POA: Diagnosis not present

## 2017-11-16 DIAGNOSIS — R51 Headache: Secondary | ICD-10-CM | POA: Diagnosis not present

## 2017-11-16 DIAGNOSIS — I10 Essential (primary) hypertension: Secondary | ICD-10-CM | POA: Insufficient documentation

## 2017-11-16 DIAGNOSIS — Z79899 Other long term (current) drug therapy: Secondary | ICD-10-CM | POA: Insufficient documentation

## 2017-11-16 DIAGNOSIS — Z794 Long term (current) use of insulin: Secondary | ICD-10-CM | POA: Insufficient documentation

## 2017-11-16 DIAGNOSIS — M549 Dorsalgia, unspecified: Secondary | ICD-10-CM | POA: Diagnosis not present

## 2017-11-16 DIAGNOSIS — Y9389 Activity, other specified: Secondary | ICD-10-CM | POA: Insufficient documentation

## 2017-11-16 DIAGNOSIS — E119 Type 2 diabetes mellitus without complications: Secondary | ICD-10-CM | POA: Insufficient documentation

## 2017-11-16 DIAGNOSIS — S199XXA Unspecified injury of neck, initial encounter: Secondary | ICD-10-CM | POA: Diagnosis present

## 2017-11-16 DIAGNOSIS — M7918 Myalgia, other site: Secondary | ICD-10-CM

## 2017-11-16 MED ORDER — ACETAMINOPHEN 325 MG PO TABS
650.0000 mg | ORAL_TABLET | Freq: Once | ORAL | Status: AC
  Administered 2017-11-16: 650 mg via ORAL
  Filled 2017-11-16: qty 2

## 2017-11-16 MED ORDER — METHOCARBAMOL 500 MG PO TABS: 500.0000 mg | ORAL_TABLET | Freq: Two times a day (BID) | ORAL | 0 refills | Status: DC

## 2017-11-16 MED FILL — METHOCARBAMOL 500 MG TABS: 500 | 6 days supply | Qty: 12 | Fill #0

## 2017-11-16 NOTE — ED Notes (Signed)
ED Provider at bedside. 
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
## 2017-11-16 NOTE — Discharge Instructions (Addendum)

## 2017-11-16 NOTE — ED Notes (Signed)
Patient transported to X-ray 
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
## 2017-12-01 ENCOUNTER — Encounter: Payer: Self-pay | Admitting: Family Medicine

## 2017-12-01 ENCOUNTER — Ambulatory Visit (INDEPENDENT_AMBULATORY_CARE_PROVIDER_SITE_OTHER): Payer: Non-veteran care | Admitting: Family Medicine

## 2017-12-01 DIAGNOSIS — M545 Low back pain, unspecified: Secondary | ICD-10-CM

## 2017-12-01 DIAGNOSIS — M542 Cervicalgia: Secondary | ICD-10-CM

## 2017-12-01 MED ORDER — METHOCARBAMOL 500 MG PO TABS: 500.0000 mg | ORAL_TABLET | Freq: Two times a day (BID) | ORAL | 0 refills | Status: DC

## 2017-12-01 MED FILL — METHOCARBAMOL 500 MG TABS: 500 | 30 days supply | Qty: 60 | Fill #0

## 2017-12-01 NOTE — Progress Notes (Signed)
Pre visit review using our clinic review tool, if applicable. No additional management support is needed unless otherwise documented below in the visit note. 

## 2017-12-01 NOTE — Patient Instructions (Addendum)
Heat (pad or rice pillow in microwave) over affected area, 10-15 minutes twice daily.   Ice/cold pack over area for 10-15 min twice daily.  OK to take Tylenol 1000 mg (2 extra strength tabs) or 975 mg (3 regular strength tabs) every 6 hours as needed.  If you do not hear anything about your physical therapy referral in the next 1-2 weeks, call our office and ask for an update.  Let us know if you need anything.
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
## 2017-12-13 ENCOUNTER — Ambulatory Visit: Payer: Non-veteran care | Attending: Family Medicine | Admitting: Physical Therapy

## 2017-12-13 ENCOUNTER — Other Ambulatory Visit: Payer: Self-pay

## 2017-12-13 ENCOUNTER — Encounter: Payer: Self-pay | Admitting: Physical Therapy

## 2017-12-13 DIAGNOSIS — M545 Low back pain, unspecified: Secondary | ICD-10-CM

## 2017-12-13 DIAGNOSIS — M6281 Muscle weakness (generalized): Secondary | ICD-10-CM | POA: Insufficient documentation

## 2017-12-13 DIAGNOSIS — M542 Cervicalgia: Secondary | ICD-10-CM | POA: Insufficient documentation

## 2017-12-13 DIAGNOSIS — R29898 Other symptoms and signs involving the musculoskeletal system: Secondary | ICD-10-CM | POA: Diagnosis present

## 2017-12-13 DIAGNOSIS — R293 Abnormal posture: Secondary | ICD-10-CM | POA: Insufficient documentation

## 2017-12-13 NOTE — Patient Instructions (Signed)
Knee to Chest (Flexion)   Pull knee toward chest. Feel stretch in lower back or buttock area. Breathing deeply, Hold __10-15__ seconds. Repeat with other knee. Repeat __5-10__ times.    Lumbar Rotation (Non-Weight Bearing)   Feet on floor, slowly rock knees from side to side in small, pain-free range of motion. Allow lower back to rotate slightly. Repeat __10__ times per set. Do __1-2__ sets per session.   Flexibility: Upper Trapezius Stretch   Gently grasp right side of head while reaching behind back with other hand. Tilt head away until a gentle stretch is felt. Hold __30__ seconds. Repeat ___3_ times per set.   Scapular Retraction (Standing)   With arms at sides, pinch shoulder blades together. Repeat __10-15__ times per set. Do __2__ sets per session.
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
## 2017-12-19 ENCOUNTER — Encounter: Payer: Self-pay | Admitting: Physical Therapy

## 2017-12-19 ENCOUNTER — Ambulatory Visit: Payer: Non-veteran care | Admitting: Physical Therapy

## 2017-12-19 DIAGNOSIS — M542 Cervicalgia: Secondary | ICD-10-CM | POA: Diagnosis not present

## 2017-12-19 DIAGNOSIS — R293 Abnormal posture: Secondary | ICD-10-CM

## 2017-12-19 DIAGNOSIS — M545 Low back pain, unspecified: Secondary | ICD-10-CM

## 2017-12-19 DIAGNOSIS — M6281 Muscle weakness (generalized): Secondary | ICD-10-CM

## 2017-12-19 DIAGNOSIS — R29898 Other symptoms and signs involving the musculoskeletal system: Secondary | ICD-10-CM
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
## 2017-12-22 ENCOUNTER — Ambulatory Visit: Payer: No Typology Code available for payment source | Attending: Family Medicine | Admitting: Physical Therapy

## 2017-12-22 ENCOUNTER — Encounter: Payer: Self-pay | Admitting: Physical Therapy

## 2017-12-22 DIAGNOSIS — M6281 Muscle weakness (generalized): Secondary | ICD-10-CM | POA: Diagnosis present

## 2017-12-22 DIAGNOSIS — R293 Abnormal posture: Secondary | ICD-10-CM

## 2017-12-22 DIAGNOSIS — R29898 Other symptoms and signs involving the musculoskeletal system: Secondary | ICD-10-CM

## 2017-12-22 DIAGNOSIS — M545 Low back pain, unspecified: Secondary | ICD-10-CM

## 2017-12-22 DIAGNOSIS — M542 Cervicalgia: Secondary | ICD-10-CM
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
## 2017-12-22 NOTE — Patient Instructions (Signed)
Strengthening: Straight Leg Raise (Phase 1)    Tighten muscles on front of right thigh, then lift leg __~8__ inches from surface, keeping knee locked.  Repeat __10-15__ times per set. Do __2__ sets per session.   Bridging    Slowly raise buttocks from floor, keeping stomach tight. Repeat __10-15__ times per set. Do __2__ sets per session.   Resistive Band Rowing    With resistive band anchored in door, grasp both ends. Keeping elbows bent, pull back, squeezing shoulder blades together. Hold _5___ seconds. Repeat __15__ times. Do __2__ sessions per day.

## 2017-12-26 ENCOUNTER — Encounter: Payer: Self-pay | Admitting: Physical Therapy

## 2017-12-26 ENCOUNTER — Ambulatory Visit: Payer: No Typology Code available for payment source | Admitting: Physical Therapy

## 2017-12-26 DIAGNOSIS — M542 Cervicalgia: Secondary | ICD-10-CM | POA: Diagnosis not present

## 2017-12-26 DIAGNOSIS — M545 Low back pain, unspecified: Secondary | ICD-10-CM

## 2017-12-26 DIAGNOSIS — R293 Abnormal posture: Secondary | ICD-10-CM

## 2017-12-26 DIAGNOSIS — R29898 Other symptoms and signs involving the musculoskeletal system: Secondary | ICD-10-CM

## 2017-12-26 DIAGNOSIS — M6281 Muscle weakness (generalized): Secondary | ICD-10-CM
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
## 2017-12-29 ENCOUNTER — Encounter: Payer: Self-pay | Admitting: Physical Therapy

## 2017-12-29 ENCOUNTER — Ambulatory Visit: Payer: No Typology Code available for payment source | Admitting: Physical Therapy

## 2017-12-29 DIAGNOSIS — M542 Cervicalgia: Secondary | ICD-10-CM

## 2017-12-29 DIAGNOSIS — R29898 Other symptoms and signs involving the musculoskeletal system: Secondary | ICD-10-CM

## 2017-12-29 DIAGNOSIS — M545 Low back pain, unspecified: Secondary | ICD-10-CM

## 2017-12-29 DIAGNOSIS — M6281 Muscle weakness (generalized): Secondary | ICD-10-CM

## 2017-12-29 DIAGNOSIS — R293 Abnormal posture: Secondary | ICD-10-CM
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
## 2018-01-02 ENCOUNTER — Encounter: Payer: Self-pay | Admitting: Physical Therapy

## 2018-01-02 ENCOUNTER — Ambulatory Visit: Payer: No Typology Code available for payment source | Admitting: Physical Therapy

## 2018-01-02 DIAGNOSIS — M542 Cervicalgia: Secondary | ICD-10-CM

## 2018-01-02 DIAGNOSIS — M545 Low back pain, unspecified: Secondary | ICD-10-CM

## 2018-01-02 DIAGNOSIS — M6281 Muscle weakness (generalized): Secondary | ICD-10-CM

## 2018-01-02 DIAGNOSIS — R293 Abnormal posture: Secondary | ICD-10-CM

## 2018-01-02 DIAGNOSIS — R29898 Other symptoms and signs involving the musculoskeletal system: Secondary | ICD-10-CM
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
## 2018-01-05 ENCOUNTER — Ambulatory Visit: Payer: No Typology Code available for payment source | Admitting: Physical Therapy

## 2018-01-05 ENCOUNTER — Encounter: Payer: Non-veteran care | Admitting: Physical Therapy

## 2018-01-09 ENCOUNTER — Ambulatory Visit: Payer: No Typology Code available for payment source | Admitting: Physical Therapy

## 2018-01-09 ENCOUNTER — Encounter: Payer: Self-pay | Admitting: Physical Therapy

## 2018-01-09 DIAGNOSIS — R293 Abnormal posture: Secondary | ICD-10-CM

## 2018-01-09 DIAGNOSIS — M6281 Muscle weakness (generalized): Secondary | ICD-10-CM

## 2018-01-09 DIAGNOSIS — M545 Low back pain, unspecified: Secondary | ICD-10-CM

## 2018-01-09 DIAGNOSIS — M542 Cervicalgia: Secondary | ICD-10-CM

## 2018-01-09 DIAGNOSIS — R29898 Other symptoms and signs involving the musculoskeletal system: Secondary | ICD-10-CM
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
## 2018-01-09 NOTE — Therapy (Signed)
[facility] [address]  [address] [place], [place], [address] Phone: [phone]   Fax:  [phone]  Physical Therapy Evaluation  Patient Details  Name: Richard Russell MRN: [id number] Date of Birth: May 11, 1951 Referring Provider: Dr. Carmelia Roller   Encounter Date: 01/09/2018    Past Medical History:  Diagnosis Date  . Diabetes mellitus without complication (HCC)   . Hypercholesteremia   . Hypertension     Past Surgical History:  Procedure Laterality Date  . BACK SURGERY    . LAPAROSCOPIC APPENDECTOMY N/A 02/24/2015   Procedure: APPENDECTOMY LAPAROSCOPIC;  Surgeon: Avel Peace, MD;  Location: [facility];  Service: General;  Laterality: N/A;    There were no vitals filed for this visit.   Subjective Assessment - 01/09/18 1017    Subjective  Patient reports he is getting a little better. Tries to do as much as he can with the exercises. Using TENS unit 2x a day which helps.    Diagnostic tests  all imaging negative    Patient Stated Goals  improve pain    Currently in Pain?  Yes    Pain Score  8     Pain Location  Back    Pain Orientation  Right;Left;Lower    Pain Descriptors / Indicators  Aching;Sore                    Objective measurements completed on examination: See above findings.      [facility] Adult PT Treatment/Exercise - 01/09/18 1027      Lumbar Exercises: Aerobic   Nustep  L5 x 6 min      Lumbar Exercises: Standing   Functional Squats  10 reps;Limitations    Functional Squats Limitations  picking up/putting down orange weighted ball; sumo squat to floor with straight spine    Other Standing Lumbar Exercises  Standing pallof press with red TB doubled over; 10x each side    Other Standing Lumbar Exercises  Forward trunk flexion with straight spine arms behind head; 8x      Lumbar Exercises: Supine   Bridge  15 reps    Bridge Limitations  B feet together on dynadisc    Other Supine Lumbar  Exercises  B LEs on pball; red TB around feet alt resisted hip flexion; 10x VCs to avoid valsalva      Lumbar Exercises: Quadruped   Madcat/Old Horse  10 reps requiring VC/TC to correct form; difficulty rounding back      Moist Heat Therapy   Number Minutes Moist Heat  15 Minutes    Moist Heat Location  Lumbar Spine;Cervical      Electrical Stimulation   Electrical Stimulation Location  B low back    Electrical Stimulation Action  IFC    Electrical Stimulation Parameters  to tolerance    Electrical Stimulation Goals  Pain                  PT Long Term Goals - 12/19/17 0932      PT LONG TERM GOAL #1   Title  patient to be independent with advanced HEP    Status  On-going      PT LONG TERM GOAL #2   Title  patient to improve cervical and lumbar AROM to [facility] without pain limiting    Status  On-going      PT LONG TERM GOAL #3   Title  patient to improve B UE and LE gross strength  to >/= 4+/5    Status  On-going      PT LONG TERM GOAL #4   Title  patient to report ability to sleep (in bed) for >/= 6 hours without limitations from pain    Status  On-going      PT LONG TERM GOAL #5   Title  patient to demosntrate appropriate posture and body mechanics as it relates to his daily activities    Status  On-going             Plan - 01/09/18 1100    Clinical Impression Statement  Patient arrived to session with report he feels a little better. Still with report that pain is worse in the AM and that he tends to feel stiff after sitting for a while. Performed squatting to pick up weighted ball and forward trunk flexion, both with cues to keep spine straight to reinforce proper body mechanics when leaning over/lifting throughout the day. Patient requiring heavy VC/TCs throughout ther-ex to avoid Valsalva but showed good effort today. Received e-stim and moist heat to LB, moist heat to neck at end of session; normal integumentary response at end of session.    PT  Treatment/Interventions  ADLs/Self Care Home Management;Cryotherapy;Electrical Stimulation;Moist Heat;Traction;Therapeutic exercise;Therapeutic activities;Functional mobility training;Neuromuscular re-education;Patient/family education;Manual techniques;Vasopneumatic Device;Taping;Dry needling;Passive range of motion    PT Next Visit Plan  Continue with safe lifting/bending mechanic practice/education    Consulted and Agree with Plan of Care  Patient       Patient will benefit from skilled therapeutic intervention in order to improve the following deficits and impairments:  Pain, Impaired UE functional use, Decreased range of motion, Decreased mobility, Decreased strength, Decreased activity tolerance, Postural dysfunction  Visit Diagnosis: Cervicalgia  Acute bilateral low back pain without sciatica  Abnormal posture  Other symptoms and signs involving the musculoskeletal system  Muscle weakness (generalized)     Problem List Patient Active Problem List   Diagnosis Date Noted  . Diabetes mellitus type II, uncontrolled (HCC) 03/01/2015  . DM2 (diabetes mellitus, type 2) (HCC) 02/26/2015  . HTN (hypertension) 02/26/2015  . Perforated appendicitis 02/24/2015    Maryruth Bun 01/09/2018, 11:45 AM  [facility] [address]  [address] [place], [place], [address] Phone: [phone]   Fax:  [phone]  Name: Richard Russell MRN: [id number] Date of Birth: 1951-05-14

## 2018-01-12 ENCOUNTER — Encounter: Payer: Self-pay | Admitting: Physical Therapy

## 2018-01-12 ENCOUNTER — Ambulatory Visit: Payer: No Typology Code available for payment source | Admitting: Physical Therapy

## 2018-01-12 DIAGNOSIS — R293 Abnormal posture: Secondary | ICD-10-CM

## 2018-01-12 DIAGNOSIS — M542 Cervicalgia: Secondary | ICD-10-CM | POA: Diagnosis not present

## 2018-01-12 DIAGNOSIS — M545 Low back pain, unspecified: Secondary | ICD-10-CM

## 2018-01-12 DIAGNOSIS — M6281 Muscle weakness (generalized): Secondary | ICD-10-CM

## 2018-01-12 DIAGNOSIS — R29898 Other symptoms and signs involving the musculoskeletal system: Secondary | ICD-10-CM
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
## 2018-01-16 ENCOUNTER — Ambulatory Visit: Payer: No Typology Code available for payment source | Admitting: Physical Therapy

## 2018-01-16 ENCOUNTER — Encounter: Payer: Self-pay | Admitting: Physical Therapy

## 2018-01-16 DIAGNOSIS — M6281 Muscle weakness (generalized): Secondary | ICD-10-CM

## 2018-01-16 DIAGNOSIS — M542 Cervicalgia: Secondary | ICD-10-CM | POA: Diagnosis not present

## 2018-01-16 DIAGNOSIS — M545 Low back pain, unspecified: Secondary | ICD-10-CM

## 2018-01-16 DIAGNOSIS — R293 Abnormal posture: Secondary | ICD-10-CM

## 2018-01-16 DIAGNOSIS — R29898 Other symptoms and signs involving the musculoskeletal system: Secondary | ICD-10-CM
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
## 2018-01-19 ENCOUNTER — Ambulatory Visit: Payer: No Typology Code available for payment source | Admitting: Physical Therapy

## 2018-01-19 ENCOUNTER — Encounter: Payer: Self-pay | Admitting: Physical Therapy

## 2018-01-19 DIAGNOSIS — R29898 Other symptoms and signs involving the musculoskeletal system: Secondary | ICD-10-CM

## 2018-01-19 DIAGNOSIS — M545 Low back pain, unspecified: Secondary | ICD-10-CM

## 2018-01-19 DIAGNOSIS — M542 Cervicalgia: Secondary | ICD-10-CM

## 2018-01-19 DIAGNOSIS — R293 Abnormal posture: Secondary | ICD-10-CM

## 2018-01-19 DIAGNOSIS — M6281 Muscle weakness (generalized): Secondary | ICD-10-CM
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
## 2018-01-23 ENCOUNTER — Ambulatory Visit: Payer: No Typology Code available for payment source | Attending: Family Medicine | Admitting: Physical Therapy

## 2018-01-23 ENCOUNTER — Encounter: Payer: Self-pay | Admitting: Physical Therapy

## 2018-01-23 DIAGNOSIS — R293 Abnormal posture: Secondary | ICD-10-CM | POA: Diagnosis present

## 2018-01-23 DIAGNOSIS — M545 Low back pain, unspecified: Secondary | ICD-10-CM

## 2018-01-23 DIAGNOSIS — M6281 Muscle weakness (generalized): Secondary | ICD-10-CM | POA: Diagnosis present

## 2018-01-23 DIAGNOSIS — R29898 Other symptoms and signs involving the musculoskeletal system: Secondary | ICD-10-CM | POA: Diagnosis present

## 2018-01-23 DIAGNOSIS — M542 Cervicalgia: Secondary | ICD-10-CM | POA: Diagnosis not present
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
## 2018-01-25 ENCOUNTER — Ambulatory Visit: Payer: No Typology Code available for payment source | Admitting: Physical Therapy

## 2018-01-25 ENCOUNTER — Encounter: Payer: Self-pay | Admitting: Physical Therapy

## 2018-01-25 DIAGNOSIS — R293 Abnormal posture: Secondary | ICD-10-CM

## 2018-01-25 DIAGNOSIS — M545 Low back pain, unspecified: Secondary | ICD-10-CM

## 2018-01-25 DIAGNOSIS — M542 Cervicalgia: Secondary | ICD-10-CM | POA: Diagnosis not present

## 2018-01-25 DIAGNOSIS — M6281 Muscle weakness (generalized): Secondary | ICD-10-CM

## 2018-01-25 DIAGNOSIS — R29898 Other symptoms and signs involving the musculoskeletal system: Secondary | ICD-10-CM
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
## 2018-01-30 ENCOUNTER — Encounter: Payer: Self-pay | Admitting: Physical Therapy

## 2018-01-30 ENCOUNTER — Ambulatory Visit: Payer: No Typology Code available for payment source | Admitting: Physical Therapy

## 2018-01-30 DIAGNOSIS — M542 Cervicalgia: Secondary | ICD-10-CM | POA: Diagnosis not present

## 2018-01-30 DIAGNOSIS — M545 Low back pain, unspecified: Secondary | ICD-10-CM

## 2018-01-30 DIAGNOSIS — R29898 Other symptoms and signs involving the musculoskeletal system: Secondary | ICD-10-CM

## 2018-01-30 DIAGNOSIS — R293 Abnormal posture: Secondary | ICD-10-CM

## 2018-01-30 DIAGNOSIS — M6281 Muscle weakness (generalized): Secondary | ICD-10-CM
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
## 2018-02-01 ENCOUNTER — Ambulatory Visit: Payer: No Typology Code available for payment source | Admitting: Physical Therapy

## 2018-02-01 ENCOUNTER — Encounter: Payer: Self-pay | Admitting: Physical Therapy

## 2018-02-01 DIAGNOSIS — M542 Cervicalgia: Secondary | ICD-10-CM | POA: Diagnosis not present

## 2018-02-01 DIAGNOSIS — R29898 Other symptoms and signs involving the musculoskeletal system: Secondary | ICD-10-CM

## 2018-02-01 DIAGNOSIS — R293 Abnormal posture: Secondary | ICD-10-CM

## 2018-02-01 DIAGNOSIS — M545 Low back pain, unspecified: Secondary | ICD-10-CM

## 2018-02-01 DIAGNOSIS — M6281 Muscle weakness (generalized): Secondary | ICD-10-CM
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
## 2018-02-06 ENCOUNTER — Ambulatory Visit: Payer: No Typology Code available for payment source | Admitting: Physical Therapy

## 2018-02-06 ENCOUNTER — Encounter: Payer: Self-pay | Admitting: Physical Therapy

## 2018-02-06 DIAGNOSIS — R293 Abnormal posture: Secondary | ICD-10-CM

## 2018-02-06 DIAGNOSIS — M6281 Muscle weakness (generalized): Secondary | ICD-10-CM

## 2018-02-06 DIAGNOSIS — M545 Low back pain, unspecified: Secondary | ICD-10-CM

## 2018-02-06 DIAGNOSIS — R29898 Other symptoms and signs involving the musculoskeletal system: Secondary | ICD-10-CM

## 2018-02-06 DIAGNOSIS — M542 Cervicalgia: Secondary | ICD-10-CM | POA: Diagnosis not present
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
## 2018-02-09 ENCOUNTER — Encounter: Payer: Self-pay | Admitting: Physical Therapy

## 2018-02-09 ENCOUNTER — Ambulatory Visit: Payer: No Typology Code available for payment source | Admitting: Physical Therapy

## 2018-02-09 DIAGNOSIS — M6281 Muscle weakness (generalized): Secondary | ICD-10-CM

## 2018-02-09 DIAGNOSIS — M545 Low back pain, unspecified: Secondary | ICD-10-CM

## 2018-02-09 DIAGNOSIS — M542 Cervicalgia: Secondary | ICD-10-CM

## 2018-02-09 DIAGNOSIS — R29898 Other symptoms and signs involving the musculoskeletal system: Secondary | ICD-10-CM

## 2018-02-09 DIAGNOSIS — R293 Abnormal posture: Secondary | ICD-10-CM
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
## 2018-02-13 ENCOUNTER — Ambulatory Visit: Payer: No Typology Code available for payment source | Admitting: Physical Therapy

## 2018-02-13 DIAGNOSIS — M545 Low back pain, unspecified: Secondary | ICD-10-CM

## 2018-02-13 DIAGNOSIS — M542 Cervicalgia: Secondary | ICD-10-CM

## 2018-02-13 DIAGNOSIS — R293 Abnormal posture: Secondary | ICD-10-CM

## 2018-02-13 DIAGNOSIS — R29898 Other symptoms and signs involving the musculoskeletal system: Secondary | ICD-10-CM

## 2018-02-13 DIAGNOSIS — M6281 Muscle weakness (generalized): Secondary | ICD-10-CM
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
## 2018-02-15 ENCOUNTER — Ambulatory Visit: Payer: No Typology Code available for payment source | Admitting: Physical Therapy

## 2018-02-20 ENCOUNTER — Ambulatory Visit: Payer: Non-veteran care | Attending: Family Medicine | Admitting: Physical Therapy

## 2018-02-20 ENCOUNTER — Encounter: Payer: Self-pay | Admitting: Physical Therapy

## 2018-02-20 DIAGNOSIS — R29898 Other symptoms and signs involving the musculoskeletal system: Secondary | ICD-10-CM

## 2018-02-20 DIAGNOSIS — M542 Cervicalgia: Secondary | ICD-10-CM

## 2018-02-20 DIAGNOSIS — M6281 Muscle weakness (generalized): Secondary | ICD-10-CM | POA: Diagnosis present

## 2018-02-20 DIAGNOSIS — R293 Abnormal posture: Secondary | ICD-10-CM

## 2018-02-20 DIAGNOSIS — M545 Low back pain, unspecified: Secondary | ICD-10-CM
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
## 2018-02-23 ENCOUNTER — Encounter: Payer: Self-pay | Admitting: Physical Therapy

## 2018-02-23 ENCOUNTER — Ambulatory Visit: Payer: Non-veteran care | Admitting: Physical Therapy

## 2018-02-23 DIAGNOSIS — M542 Cervicalgia: Secondary | ICD-10-CM

## 2018-02-23 DIAGNOSIS — R29898 Other symptoms and signs involving the musculoskeletal system: Secondary | ICD-10-CM

## 2018-02-23 DIAGNOSIS — M6281 Muscle weakness (generalized): Secondary | ICD-10-CM

## 2018-02-23 DIAGNOSIS — M545 Low back pain, unspecified: Secondary | ICD-10-CM

## 2018-02-23 DIAGNOSIS — R293 Abnormal posture: Secondary | ICD-10-CM
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
## 2018-02-27 ENCOUNTER — Ambulatory Visit: Payer: Non-veteran care | Admitting: Physical Therapy

## 2018-03-01 ENCOUNTER — Ambulatory Visit: Payer: Non-veteran care | Admitting: Physical Therapy

## 2018-03-01 ENCOUNTER — Encounter: Payer: Self-pay | Admitting: Physical Therapy

## 2018-03-01 DIAGNOSIS — R29898 Other symptoms and signs involving the musculoskeletal system: Secondary | ICD-10-CM

## 2018-03-01 DIAGNOSIS — M545 Low back pain, unspecified: Secondary | ICD-10-CM

## 2018-03-01 DIAGNOSIS — M542 Cervicalgia: Secondary | ICD-10-CM

## 2018-03-01 DIAGNOSIS — M6281 Muscle weakness (generalized): Secondary | ICD-10-CM

## 2018-03-01 DIAGNOSIS — R293 Abnormal posture: Secondary | ICD-10-CM
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
## 2018-03-06 ENCOUNTER — Ambulatory Visit: Payer: Non-veteran care | Admitting: Physical Therapy

## 2018-03-06 ENCOUNTER — Encounter: Payer: Self-pay | Admitting: Physical Therapy

## 2018-03-06 DIAGNOSIS — M542 Cervicalgia: Secondary | ICD-10-CM

## 2018-03-06 DIAGNOSIS — M545 Low back pain, unspecified: Secondary | ICD-10-CM

## 2018-03-06 DIAGNOSIS — M6281 Muscle weakness (generalized): Secondary | ICD-10-CM

## 2018-03-06 DIAGNOSIS — R293 Abnormal posture: Secondary | ICD-10-CM

## 2018-03-06 DIAGNOSIS — R29898 Other symptoms and signs involving the musculoskeletal system: Secondary | ICD-10-CM
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
## 2018-03-06 NOTE — Addendum Note (Signed)
Addended by: Anette GuarneriKOVALENKO, Obinna Ehresman D on: 03/06/2018 03:53 PM   Modules accepted: Orders

## 2018-03-08 ENCOUNTER — Ambulatory Visit: Payer: Non-veteran care | Admitting: Physical Therapy

## 2018-03-08 ENCOUNTER — Encounter: Payer: Self-pay | Admitting: Physical Therapy

## 2018-03-08 DIAGNOSIS — M542 Cervicalgia: Secondary | ICD-10-CM

## 2018-03-08 DIAGNOSIS — M545 Low back pain, unspecified: Secondary | ICD-10-CM

## 2018-03-08 DIAGNOSIS — M6281 Muscle weakness (generalized): Secondary | ICD-10-CM

## 2018-03-08 DIAGNOSIS — R293 Abnormal posture: Secondary | ICD-10-CM

## 2018-03-08 DIAGNOSIS — R29898 Other symptoms and signs involving the musculoskeletal system: Secondary | ICD-10-CM
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
## 2018-03-13 ENCOUNTER — Ambulatory Visit: Payer: Non-veteran care | Admitting: Physical Therapy

## 2018-03-13 DIAGNOSIS — M542 Cervicalgia: Secondary | ICD-10-CM | POA: Diagnosis not present

## 2018-03-13 DIAGNOSIS — M545 Low back pain, unspecified: Secondary | ICD-10-CM

## 2018-03-13 DIAGNOSIS — R293 Abnormal posture: Secondary | ICD-10-CM

## 2018-03-13 DIAGNOSIS — R29898 Other symptoms and signs involving the musculoskeletal system: Secondary | ICD-10-CM

## 2018-03-13 DIAGNOSIS — M6281 Muscle weakness (generalized): Secondary | ICD-10-CM
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
## 2018-06-22 ENCOUNTER — Telehealth: Payer: Self-pay | Admitting: *Deleted

## 2018-06-22 NOTE — Telephone Encounter (Signed)
Received request for Medical Records and Itemized Bills from [employer]; forwarded to Medical Records via email/scan/SLS 11/01

## 2019-11-19 ENCOUNTER — Encounter (HOSPITAL_COMMUNITY): Payer: Self-pay | Admitting: Emergency Medicine

## 2019-11-19 ENCOUNTER — Emergency Department (HOSPITAL_COMMUNITY): Payer: Medicare Other

## 2019-11-19 ENCOUNTER — Emergency Department (HOSPITAL_COMMUNITY)
Admission: EM | Admit: 2019-11-19 | Discharge: 2019-11-19 | Disposition: A | Discharge status: Home / Self Care | Payer: Medicare Other | Attending: Emergency Medicine | Admitting: Emergency Medicine

## 2019-11-19 ENCOUNTER — Other Ambulatory Visit: Payer: Self-pay

## 2019-11-19 DIAGNOSIS — E119 Type 2 diabetes mellitus without complications: Secondary | ICD-10-CM | POA: Insufficient documentation

## 2019-11-19 DIAGNOSIS — Z794 Long term (current) use of insulin: Secondary | ICD-10-CM | POA: Insufficient documentation

## 2019-11-19 DIAGNOSIS — Y939 Activity, unspecified: Secondary | ICD-10-CM | POA: Insufficient documentation

## 2019-11-19 DIAGNOSIS — M542 Cervicalgia: Secondary | ICD-10-CM | POA: Insufficient documentation

## 2019-11-19 DIAGNOSIS — Y929 Unspecified place or not applicable: Secondary | ICD-10-CM | POA: Insufficient documentation

## 2019-11-19 DIAGNOSIS — S39012A Strain of muscle, fascia and tendon of lower back, initial encounter: Secondary | ICD-10-CM | POA: Insufficient documentation

## 2019-11-19 DIAGNOSIS — M545 Low back pain: Secondary | ICD-10-CM | POA: Diagnosis present

## 2019-11-19 DIAGNOSIS — S0990XA Unspecified injury of head, initial encounter: Secondary | ICD-10-CM | POA: Diagnosis not present

## 2019-11-19 DIAGNOSIS — Y999 Unspecified external cause status: Secondary | ICD-10-CM | POA: Diagnosis not present

## 2019-11-19 DIAGNOSIS — I10 Essential (primary) hypertension: Secondary | ICD-10-CM | POA: Insufficient documentation

## 2019-11-19 DIAGNOSIS — Z79899 Other long term (current) drug therapy: Secondary | ICD-10-CM | POA: Insufficient documentation

## 2019-11-19 MED ORDER — METHOCARBAMOL 500 MG PO TABS: 500.0000 mg | ORAL_TABLET | Freq: Two times a day (BID) | ORAL | 0 refills | Status: DC
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
## 2019-11-20 MED FILL — METHOCARBAMOL 500 MG TABS: 500 | 10 days supply | Qty: 20 | Fill #0

## 2020-10-21 DIAGNOSIS — I82A19 Acute embolism and thrombosis of unspecified axillary vein: Secondary | ICD-10-CM

## 2020-10-21 HISTORY — DX: Acute embolism and thrombosis of unspecified axillary vein: I82.A19

## 2020-10-23 ENCOUNTER — Inpatient Hospital Stay (HOSPITAL_COMMUNITY): Payer: Medicare Other

## 2020-10-23 ENCOUNTER — Emergency Department (HOSPITAL_BASED_OUTPATIENT_CLINIC_OR_DEPARTMENT_OTHER): Payer: Medicare Other

## 2020-10-23 ENCOUNTER — Encounter (HOSPITAL_BASED_OUTPATIENT_CLINIC_OR_DEPARTMENT_OTHER): Payer: Self-pay

## 2020-10-23 ENCOUNTER — Inpatient Hospital Stay (HOSPITAL_BASED_OUTPATIENT_CLINIC_OR_DEPARTMENT_OTHER)
Admission: EM | Admit: 2020-10-23 | Discharge: 2020-10-31 | DRG: 854 | Disposition: A | Discharge status: Home Health | Payer: Medicare Other | Attending: Internal Medicine | Admitting: Internal Medicine

## 2020-10-23 ENCOUNTER — Other Ambulatory Visit: Payer: Self-pay

## 2020-10-23 DIAGNOSIS — I129 Hypertensive chronic kidney disease with stage 1 through stage 4 chronic kidney disease, or unspecified chronic kidney disease: Secondary | ICD-10-CM | POA: Diagnosis present

## 2020-10-23 DIAGNOSIS — I82412 Acute embolism and thrombosis of left femoral vein: Secondary | ICD-10-CM | POA: Diagnosis present

## 2020-10-23 DIAGNOSIS — Z6833 Body mass index (BMI) 33.0-33.9, adult: Secondary | ICD-10-CM

## 2020-10-23 DIAGNOSIS — Z79899 Other long term (current) drug therapy: Secondary | ICD-10-CM

## 2020-10-23 DIAGNOSIS — B964 Proteus (mirabilis) (morganii) as the cause of diseases classified elsewhere: Secondary | ICD-10-CM | POA: Diagnosis present

## 2020-10-23 DIAGNOSIS — E441 Mild protein-calorie malnutrition: Secondary | ICD-10-CM | POA: Diagnosis present

## 2020-10-23 DIAGNOSIS — E876 Hypokalemia: Secondary | ICD-10-CM | POA: Diagnosis present

## 2020-10-23 DIAGNOSIS — Z20822 Contact with and (suspected) exposure to covid-19: Secondary | ICD-10-CM | POA: Diagnosis present

## 2020-10-23 DIAGNOSIS — B9561 Methicillin susceptible Staphylococcus aureus infection as the cause of diseases classified elsewhere: Secondary | ICD-10-CM | POA: Diagnosis present

## 2020-10-23 DIAGNOSIS — I82432 Acute embolism and thrombosis of left popliteal vein: Secondary | ICD-10-CM | POA: Diagnosis present

## 2020-10-23 DIAGNOSIS — E861 Hypovolemia: Secondary | ICD-10-CM | POA: Diagnosis present

## 2020-10-23 DIAGNOSIS — Z794 Long term (current) use of insulin: Secondary | ICD-10-CM

## 2020-10-23 DIAGNOSIS — Z8249 Family history of ischemic heart disease and other diseases of the circulatory system: Secondary | ICD-10-CM

## 2020-10-23 DIAGNOSIS — M1711 Unilateral primary osteoarthritis, right knee: Secondary | ICD-10-CM | POA: Diagnosis present

## 2020-10-23 DIAGNOSIS — M869 Osteomyelitis, unspecified: Secondary | ICD-10-CM | POA: Diagnosis not present

## 2020-10-23 DIAGNOSIS — S91109A Unspecified open wound of unspecified toe(s) without damage to nail, initial encounter: Secondary | ICD-10-CM

## 2020-10-23 DIAGNOSIS — E1152 Type 2 diabetes mellitus with diabetic peripheral angiopathy with gangrene: Secondary | ICD-10-CM | POA: Diagnosis present

## 2020-10-23 DIAGNOSIS — E1165 Type 2 diabetes mellitus with hyperglycemia: Secondary | ICD-10-CM | POA: Diagnosis present

## 2020-10-23 DIAGNOSIS — A419 Sepsis, unspecified organism: Secondary | ICD-10-CM | POA: Diagnosis present

## 2020-10-23 DIAGNOSIS — E7800 Pure hypercholesterolemia, unspecified: Secondary | ICD-10-CM | POA: Diagnosis present

## 2020-10-23 DIAGNOSIS — L03116 Cellulitis of left lower limb: Secondary | ICD-10-CM | POA: Diagnosis present

## 2020-10-23 DIAGNOSIS — M86172 Other acute osteomyelitis, left ankle and foot: Secondary | ICD-10-CM | POA: Diagnosis present

## 2020-10-23 DIAGNOSIS — I10 Essential (primary) hypertension: Secondary | ICD-10-CM | POA: Diagnosis present

## 2020-10-23 DIAGNOSIS — M009 Pyogenic arthritis, unspecified: Secondary | ICD-10-CM | POA: Diagnosis present

## 2020-10-23 DIAGNOSIS — E1169 Type 2 diabetes mellitus with other specified complication: Secondary | ICD-10-CM | POA: Diagnosis present

## 2020-10-23 DIAGNOSIS — E11621 Type 2 diabetes mellitus with foot ulcer: Secondary | ICD-10-CM | POA: Diagnosis present

## 2020-10-23 DIAGNOSIS — E871 Hypo-osmolality and hyponatremia: Secondary | ICD-10-CM | POA: Diagnosis present

## 2020-10-23 DIAGNOSIS — N1832 Chronic kidney disease, stage 3b: Secondary | ICD-10-CM | POA: Diagnosis present

## 2020-10-23 DIAGNOSIS — M79675 Pain in left toe(s): Secondary | ICD-10-CM | POA: Diagnosis present

## 2020-10-23 DIAGNOSIS — E1122 Type 2 diabetes mellitus with diabetic chronic kidney disease: Secondary | ICD-10-CM | POA: Diagnosis present

## 2020-10-23 DIAGNOSIS — N179 Acute kidney failure, unspecified: Secondary | ICD-10-CM | POA: Diagnosis present

## 2020-10-23 DIAGNOSIS — K5900 Constipation, unspecified: Secondary | ICD-10-CM | POA: Diagnosis not present

## 2020-10-23 DIAGNOSIS — R609 Edema, unspecified: Secondary | ICD-10-CM | POA: Diagnosis not present

## 2020-10-23 DIAGNOSIS — R7989 Other specified abnormal findings of blood chemistry: Secondary | ICD-10-CM | POA: Diagnosis present

## 2020-10-23 DIAGNOSIS — D649 Anemia, unspecified: Secondary | ICD-10-CM | POA: Diagnosis present

## 2020-10-23 DIAGNOSIS — E669 Obesity, unspecified: Secondary | ICD-10-CM | POA: Diagnosis present

## 2020-10-23 DIAGNOSIS — E1142 Type 2 diabetes mellitus with diabetic polyneuropathy: Secondary | ICD-10-CM | POA: Diagnosis present

## 2020-10-23 DIAGNOSIS — L0390 Cellulitis, unspecified: Secondary | ICD-10-CM | POA: Diagnosis not present

## 2020-10-23 DIAGNOSIS — E860 Dehydration: Secondary | ICD-10-CM | POA: Diagnosis present

## 2020-10-23 DIAGNOSIS — M25569 Pain in unspecified knee: Secondary | ICD-10-CM

## 2020-10-23 DIAGNOSIS — L97529 Non-pressure chronic ulcer of other part of left foot with unspecified severity: Secondary | ICD-10-CM | POA: Diagnosis present

## 2020-10-23 DIAGNOSIS — B954 Other streptococcus as the cause of diseases classified elsewhere: Secondary | ICD-10-CM | POA: Diagnosis present

## 2020-10-23 DIAGNOSIS — E785 Hyperlipidemia, unspecified: Secondary | ICD-10-CM | POA: Diagnosis present

## 2020-10-23 DIAGNOSIS — Z833 Family history of diabetes mellitus: Secondary | ICD-10-CM

## 2020-10-23 HISTORY — DX: Type 2 diabetes mellitus with other diabetic kidney complication: E11.29

## 2020-10-23 LAB — CBC WITH DIFFERENTIAL/PLATELET
Abs Immature Granulocytes: 0.07 10*3/uL (ref 0.00–0.07)
Abs Immature Granulocytes: 0.11 10*3/uL — ABNORMAL HIGH (ref 0.00–0.07)
Basophils Absolute: 0 10*3/uL (ref 0.0–0.1)
Basophils Absolute: 0 10*3/uL (ref 0.0–0.1)
Basophils Relative: 0 %
Basophils Relative: 0 %
Eosinophils Absolute: 0 10*3/uL (ref 0.0–0.5)
Eosinophils Absolute: 0 10*3/uL (ref 0.0–0.5)
Eosinophils Relative: 0 %
Eosinophils Relative: 0 %
HCT: 32.9 % — ABNORMAL LOW (ref 39.0–52.0)
HCT: 34.4 % — ABNORMAL LOW (ref 39.0–52.0)
Hemoglobin: 11.7 g/dL — ABNORMAL LOW (ref 13.0–17.0)
Hemoglobin: 12 g/dL — ABNORMAL LOW (ref 13.0–17.0)
Immature Granulocytes: 0 %
Immature Granulocytes: 1 %
Lymphocytes Relative: 12 %
Lymphocytes Relative: 15 %
Lymphs Abs: 1.9 10*3/uL (ref 0.7–4.0)
Lymphs Abs: 2.7 10*3/uL (ref 0.7–4.0)
MCH: 29.5 pg (ref 26.0–34.0)
MCH: 29.1 pg (ref 26.0–34.0)
MCHC: 35.6 g/dL (ref 30.0–36.0)
MCHC: 34.9 g/dL (ref 30.0–36.0)
MCV: 82.9 fL (ref 80.0–100.0)
MCV: 83.3 fL (ref 80.0–100.0)
Monocytes Absolute: 1.6 10*3/uL — ABNORMAL HIGH (ref 0.1–1.0)
Monocytes Absolute: 1.4 10*3/uL — ABNORMAL HIGH (ref 0.1–1.0)
Monocytes Relative: 10 %
Monocytes Relative: 8 %
Neutro Abs: 12.3 10*3/uL — ABNORMAL HIGH (ref 1.7–7.7)
Neutro Abs: 13.4 10*3/uL — ABNORMAL HIGH (ref 1.7–7.7)
Neutrophils Relative %: 78 %
Neutrophils Relative %: 76 %
Platelets: 347 10*3/uL (ref 150–400)
Platelets: 348 10*3/uL (ref 150–400)
RBC: 3.97 MIL/uL — ABNORMAL LOW (ref 4.22–5.81)
RBC: 4.13 MIL/uL — ABNORMAL LOW (ref 4.22–5.81)
RDW: 11.6 % (ref 11.5–15.5)
RDW: 11.6 % (ref 11.5–15.5)
WBC: 15.9 10*3/uL — ABNORMAL HIGH (ref 4.0–10.5)
WBC: 17.7 10*3/uL — ABNORMAL HIGH (ref 4.0–10.5)
nRBC: 0 % (ref 0.0–0.2)
nRBC: 0 % (ref 0.0–0.2)

## 2020-10-23 LAB — BASIC METABOLIC PANEL
Anion gap: 15 (ref 5–15)
BUN: 23 mg/dL (ref 8–23)
CO2: 22 mmol/L (ref 22–32)
Calcium: 8.2 mg/dL — ABNORMAL LOW (ref 8.9–10.3)
Chloride: 91 mmol/L — ABNORMAL LOW (ref 98–111)
Creatinine, Ser: 1.92 mg/dL — ABNORMAL HIGH (ref 0.61–1.24)
GFR, Estimated: 38 mL/min — ABNORMAL LOW (ref >60)
Glucose, Bld: 447 mg/dL — ABNORMAL HIGH (ref 70–99)
Potassium: 3.6 mmol/L (ref 3.5–5.1)
Sodium: 128 mmol/L — ABNORMAL LOW (ref 135–145)

## 2020-10-23 LAB — URINALYSIS, ROUTINE W REFLEX MICROSCOPIC
Bacteria, UA: NONE SEEN
Bilirubin Urine: NEGATIVE
Glucose, UA: >=500 mg/dL — AB
Ketones, ur: 5 mg/dL — AB
Leukocytes,Ua: NEGATIVE
Nitrite: NEGATIVE
Protein, ur: 30 mg/dL — AB
Specific Gravity, Urine: 1.017 (ref 1.005–1.030)
pH: 5 (ref 5.0–8.0)

## 2020-10-23 LAB — COMPREHENSIVE METABOLIC PANEL
ALT: 27 U/L (ref 0–44)
AST: 29 U/L (ref 15–41)
Albumin: 3 g/dL — ABNORMAL LOW (ref 3.5–5.0)
Alkaline Phosphatase: 92 U/L (ref 38–126)
Anion gap: 13 (ref 5–15)
BUN: 23 mg/dL (ref 8–23)
CO2: 26 mmol/L (ref 22–32)
Calcium: 8.5 mg/dL — ABNORMAL LOW (ref 8.9–10.3)
Chloride: 92 mmol/L — ABNORMAL LOW (ref 98–111)
Creatinine, Ser: 2.09 mg/dL — ABNORMAL HIGH (ref 0.61–1.24)
GFR, Estimated: 34 mL/min — ABNORMAL LOW (ref >60)
Glucose, Bld: 280 mg/dL — ABNORMAL HIGH (ref 70–99)
Potassium: 3.4 mmol/L — ABNORMAL LOW (ref 3.5–5.1)
Sodium: 131 mmol/L — ABNORMAL LOW (ref 135–145)
Total Bilirubin: 1.1 mg/dL (ref 0.3–1.2)
Total Protein: 8.2 g/dL — ABNORMAL HIGH (ref 6.5–8.1)

## 2020-10-23 LAB — LACTIC ACID, PLASMA: Lactic Acid, Venous: 1.4 mmol/L (ref 0.5–1.9)

## 2020-10-23 LAB — C-REACTIVE PROTEIN: CRP: 34.6 mg/dL — ABNORMAL HIGH (ref <1.0)

## 2020-10-23 LAB — HEMOGLOBIN A1C
Hgb A1c MFr Bld: 10.6 % — ABNORMAL HIGH (ref 4.8–5.6)
Mean Plasma Glucose: 257.52 mg/dL

## 2020-10-23 LAB — PREALBUMIN: Prealbumin: 6.5 mg/dL — ABNORMAL LOW (ref 18–38)

## 2020-10-23 LAB — SEDIMENTATION RATE: Sed Rate: 127 mm/hr — ABNORMAL HIGH (ref 0–16)

## 2020-10-23 LAB — HIV ANTIBODY (ROUTINE TESTING W REFLEX): HIV Screen 4th Generation wRfx: NONREACTIVE

## 2020-10-23 LAB — SARS CORONAVIRUS 2 (TAT 6-24 HRS): SARS Coronavirus 2: NEGATIVE

## 2020-10-23 LAB — GLUCOSE, CAPILLARY: Glucose-Capillary: 429 mg/dL — ABNORMAL HIGH (ref 70–99)

## 2020-10-23 LAB — CBG MONITORING, ED: Glucose-Capillary: 250 mg/dL — ABNORMAL HIGH (ref 70–99)

## 2020-10-23 MED ORDER — VANCOMYCIN HCL IN DEXTROSE 1-5 GM/200ML-% IV SOLN
1000.0000 mg | Freq: Once | INTRAVENOUS | Status: AC
  Administered 2020-10-23: 1000 mg via INTRAVENOUS
  Filled 2020-10-23: qty 200

## 2020-10-23 MED ORDER — OXYCODONE HCL 5 MG PO TABS: 5.0000 mg | ORAL_TABLET | ORAL | Status: DC | PRN

## 2020-10-23 MED ORDER — VANCOMYCIN HCL IN DEXTROSE 1-5 GM/200ML-% IV SOLN: 1000.0000 mg | Freq: Once | INTRAVENOUS | Status: DC

## 2020-10-23 MED ORDER — ENOXAPARIN SODIUM 60 MG/0.6ML ~~LOC~~ SOLN
55.0000 mg | Freq: Every day | SUBCUTANEOUS | Status: DC
  Administered 2020-10-23 – 2020-10-28 (×5): 55 mg via SUBCUTANEOUS
  Filled 2020-10-23 (×6): qty 0.6

## 2020-10-23 MED ORDER — INSULIN DETEMIR 100 UNIT/ML ~~LOC~~ SOLN
30.0000 [IU] | Freq: Two times a day (BID) | SUBCUTANEOUS | Status: DC
  Administered 2020-10-23 – 2020-10-24 (×2): 30 [IU] via SUBCUTANEOUS
  Filled 2020-10-23 (×4): qty 0.3

## 2020-10-23 MED ORDER — SODIUM CHLORIDE 0.9 % IV SOLN
2.0000 g | Freq: Two times a day (BID) | INTRAVENOUS | Status: DC
  Administered 2020-10-23 – 2020-10-29 (×13): 2 g via INTRAVENOUS
  Filled 2020-10-23 (×13): qty 2

## 2020-10-23 MED ORDER — CLONIDINE HCL 0.2 MG/24HR TD PTWK
0.2000 mg | MEDICATED_PATCH | TRANSDERMAL | Status: DC
  Administered 2020-10-23 – 2020-10-30 (×2): 0.2 mg via TRANSDERMAL
  Filled 2020-10-23 (×2): qty 1

## 2020-10-23 MED ORDER — VANCOMYCIN HCL 1250 MG/250ML IV SOLN
1250.0000 mg | INTRAVENOUS | Status: DC
  Administered 2020-10-24 – 2020-10-25 (×2): 1250 mg via INTRAVENOUS
  Filled 2020-10-23 (×4): qty 250

## 2020-10-23 MED ORDER — INSULIN ASPART 100 UNIT/ML ~~LOC~~ SOLN
20.0000 [IU] | Freq: Once | SUBCUTANEOUS | Status: AC
  Administered 2020-10-24: 20 [IU] via SUBCUTANEOUS

## 2020-10-23 MED ORDER — AMLODIPINE BESYLATE 10 MG PO TABS
10.0000 mg | ORAL_TABLET | Freq: Every day | ORAL | Status: DC
  Administered 2020-10-24 – 2020-10-31 (×8): 10 mg via ORAL
  Filled 2020-10-23 (×8): qty 1

## 2020-10-23 MED ORDER — SODIUM CHLORIDE 0.45 % IV SOLN: INTRAVENOUS | Status: DC

## 2020-10-23 MED ORDER — METHOCARBAMOL 500 MG PO TABS
500.0000 mg | ORAL_TABLET | Freq: Two times a day (BID) | ORAL | Status: DC
  Administered 2020-10-23 – 2020-10-29 (×12): 500 mg via ORAL
  Filled 2020-10-23 (×13): qty 1

## 2020-10-23 MED ORDER — ACETAMINOPHEN 325 MG PO TABS
650.0000 mg | ORAL_TABLET | Freq: Four times a day (QID) | ORAL | Status: DC | PRN
  Administered 2020-10-24 – 2020-10-28 (×6): 650 mg via ORAL
  Filled 2020-10-23 (×6): qty 2

## 2020-10-23 MED ORDER — GABAPENTIN 400 MG PO CAPS
800.0000 mg | ORAL_CAPSULE | Freq: Three times a day (TID) | ORAL | Status: DC
  Administered 2020-10-23 – 2020-10-31 (×23): 800 mg via ORAL
  Filled 2020-10-23 (×23): qty 2

## 2020-10-23 MED ORDER — ACETAMINOPHEN 500 MG PO TABS
1000.0000 mg | ORAL_TABLET | Freq: Once | ORAL | Status: AC
  Administered 2020-10-23: 1000 mg via ORAL
  Filled 2020-10-23: qty 2

## 2020-10-23 MED ORDER — POTASSIUM CHLORIDE CRYS ER 20 MEQ PO TBCR
40.0000 meq | EXTENDED_RELEASE_TABLET | Freq: Once | ORAL | Status: AC
  Administered 2020-10-23: 20 meq via ORAL
  Filled 2020-10-23: qty 2

## 2020-10-23 MED ORDER — INSULIN ASPART 100 UNIT/ML ~~LOC~~ SOLN
0.0000 [IU] | Freq: Three times a day (TID) | SUBCUTANEOUS | Status: DC
  Administered 2020-10-24: 5 [IU] via SUBCUTANEOUS
  Administered 2020-10-24 – 2020-10-25 (×4): 8 [IU] via SUBCUTANEOUS
  Administered 2020-10-25: 3 [IU] via SUBCUTANEOUS
  Administered 2020-10-26: 12 [IU] via SUBCUTANEOUS
  Administered 2020-10-26: 5 [IU] via SUBCUTANEOUS
  Administered 2020-10-26: 11 [IU] via SUBCUTANEOUS
  Administered 2020-10-27: 5 [IU] via SUBCUTANEOUS
  Administered 2020-10-27 – 2020-10-28 (×3): 8 [IU] via SUBCUTANEOUS
  Administered 2020-10-28: 3 [IU] via SUBCUTANEOUS
  Administered 2020-10-28: 8 [IU] via SUBCUTANEOUS
  Administered 2020-10-29: 5 [IU] via SUBCUTANEOUS
  Administered 2020-10-29: 11 [IU] via SUBCUTANEOUS
  Administered 2020-10-29: 15 [IU] via SUBCUTANEOUS
  Administered 2020-10-30: 11 [IU] via SUBCUTANEOUS
  Administered 2020-10-30: 5 [IU] via SUBCUTANEOUS
  Administered 2020-10-30: 3 [IU] via SUBCUTANEOUS
  Administered 2020-10-31: 8 [IU] via SUBCUTANEOUS
  Administered 2020-10-31: 5 [IU] via SUBCUTANEOUS
  Administered 2020-10-31: 3 [IU] via SUBCUTANEOUS

## 2020-10-23 MED ORDER — ACETAMINOPHEN 650 MG RE SUPP: 650.0000 mg | Freq: Four times a day (QID) | RECTAL | Status: DC | PRN

## 2020-10-23 MED ORDER — SODIUM CHLORIDE 0.9 % IV SOLN: INTRAVENOUS | Status: DC | PRN

## 2020-10-23 MED ORDER — PROCHLORPERAZINE EDISYLATE 10 MG/2ML IJ SOLN: 5.0000 mg | INTRAMUSCULAR | Status: DC | PRN

## 2020-10-23 MED ORDER — METRONIDAZOLE 500 MG PO TABS
500.0000 mg | ORAL_TABLET | Freq: Three times a day (TID) | ORAL | Status: DC
  Administered 2020-10-23 – 2020-10-29 (×18): 500 mg via ORAL
  Filled 2020-10-23 (×17): qty 1

## 2020-10-23 MED ORDER — ATORVASTATIN CALCIUM 40 MG PO TABS
40.0000 mg | ORAL_TABLET | Freq: Every day | ORAL | Status: DC
  Administered 2020-10-24 – 2020-10-31 (×7): 40 mg via ORAL
  Filled 2020-10-23 (×7): qty 1

## 2020-10-23 MED ORDER — VANCOMYCIN HCL 2000 MG/400ML IV SOLN
2000.0000 mg | Freq: Once | INTRAVENOUS | Status: DC
  Filled 2020-10-23: qty 400

## 2020-10-23 MED ORDER — AMLODIPINE BESYLATE 10 MG PO TABS
10.0000 mg | ORAL_TABLET | Freq: Once | ORAL | Status: AC
  Administered 2020-10-23: 10 mg via ORAL
  Filled 2020-10-23: qty 1

## 2020-10-23 NOTE — Progress Notes (Signed)
Pharmacy Antibiotic Note  [facility] is a 70 y.o. male admitted on 10/23/2020 with osteomyelitis of the toe.  Pharmacy has been consulted for vancomycin and cefepime dosing.  Plan: Vancomycin 2000 mg IV x 1, then 1250 mg IV every 24 hours (eAUC 434, Goal AUC 400-550, SCr 2.09) Cefepime 2 g IV every 12 hours Monitor renal function, Cx, surgical plans and clinical progression Vancomycin levels as needed  Height: 6' (182.9 cm) Weight: 112 kg (247 lb) IBW/kg (Calculated) : 77.6  Temp (24hrs), Avg:99.5 F (37.5 C), Min:98.4 F (36.9 C), Max:100.6 F (38.1 C)  Recent Labs  Lab 10/23/20 1023 10/23/20 1032  WBC 15.9*  --   CREATININE 2.09*  --   LATICACIDVEN  --  1.4    Estimated Creatinine Clearance: 44.3 mL/min (A) (by C-G formula based on SCr of 2.09 mg/dL (H)).    No Known Allergies  Daylene Posey, PharmD Clinical Pharmacist ED Pharmacist Phone # [phone] 10/23/2020 1:11 PM

## 2020-10-23 NOTE — Progress Notes (Signed)
Notified by EDP of need for admission d/t osteomyelitis of left great toe. [facility] accepts patient to med-surg at [facility]. EDP is to remain responsible for orders/medical decisions while patient is holding at [facility]. Upon arrival to [facility], [facility] will assume care. Nursing/Floor staff will call flow manager/carelink to notify them of patient's arrival so that the proper [facility] member may be contacted. Thank you.

## 2020-10-23 NOTE — ED Notes (Signed)
XR at bedside

## 2020-10-23 NOTE — H&P (Signed)
History and Physical    [facility] BHA:[id number] DOB: 11-28-1950 DOA: 10/23/2020  PCP: [facility]   Patient coming from: Home.   I have personally briefly reviewed patient's old medical records in [place]  Chief Complaint: Foot wound.  HPI: [facility] is a 70 y.o. male with medical history significant of hypercholesterolemia, hypertension, type 2 diabetes mellitus with renal manifestations, diabetic peripheral neuropathy, class I obesity, history of back surgery with occasional back pain exacerbation who is sent from [facility] after presenting there with 1 month history of left toe injury, which turned into an ulcer that he has been managing with unknown OTC spray and pressure stockings.  However, he has recently reinjured the area developing more erythema, edema, calor and purulent discharge.  He does not complain of significant pain because decreased sensation secondary to diabetic peripheral neuropathy.  Review of system is significant for fever yesterday and once again on arrival to the ED.  He denies night sweats or chills, appetite changes, nausea or emesis.  No rhinorrhea, sore throat, dyspnea, wheezing or hemoptysis.  Denies chest pain, palpitations, diaphoresis, PND, orthopnea, but stay his left foot area has been edematous.  No abdominal pain, constipation, melena or hematochezia.  He had 2 episodes of loose stools earlier today, but states this has been self-limited.  No dysuria, frequency or hematuria.  Complains of polydipsia and polyuria, denies polyphagia or blurred vision.  ED Course: Initial vital signs were temperature 100.6 F, pulse 79, respiration 18, BP 160/75 mmHg O2 sat 98% on room air.  The patient received acetaminophen 1000 mg p.o. x1, vancomycin and cefepime per pharmacy.  Lab work: His urinalysis showed glucosuria more than 500, ketonuria 5 and proteinuria 30 mg/dL.  The rest of the chemical and microscopic urine examination was normal.  Most  recent CBC shows a white count of 17.7, hemoglobin 12.0 g/dL and platelets 348.  ESR was 127 mm/h.  Hemoglobin A1c was 10.6%, lactic acid was normal.  Earlier CMP showed a sodium 131, potassium 3.4, chloride 92 and CO2 26.  Blood glucose was 280, BUN 23 and creatinine 2.09 mg/dL.  In 2016 his creatinine level was 1.17 mg/dL with a normal GFR.  However, we do not have any measurements of creatinine in between then and now.  Total protein was 8.2 and albumin 3.0 g/dL.  Prealbumin was 6.5 and CRP 34.6 mg/dL.  Lactic acid was normal.  Imaging: Left foot x-ray is suspicious for osteomyelitis of the great toe distal phalanx tip.  Please see images and full radiology report for further detail.  Review of Systems: As per HPI otherwise all other systems reviewed and are negative.  Past Medical History:  Diagnosis Date  . Hypercholesteremia   . Hypertension   . Type II diabetes mellitus with renal manifestations [facility])    Past Surgical History:  Procedure Laterality Date  . BACK SURGERY    . LAPAROSCOPIC APPENDECTOMY N/A 02/24/2015   Procedure: APPENDECTOMY LAPAROSCOPIC;  Surgeon: Jackolyn Confer, MD;  Location: [facility];  Service: General;  Laterality: N/A;   Social History  reports that he has never smoked. He has never used smokeless tobacco. He reports that he does not drink alcohol and does not use drugs.  No Known Allergies  Family History  Problem Relation Age of Onset  . Diabetes Mother   . Heart attack Father    Prior to Admission medications   Medication Sig Start Date End Date Taking? Authorizing Provider  amLODipine (NORVASC) 10  MG tablet Take 10 mg by mouth daily.   Yes [provider]  atorvastatin (LIPITOR) 80 MG tablet Take 40 mg by mouth daily.    Yes [provider]  cloNIDine (CATAPRES - DOSED IN MG/24 HR) 0.2 mg/24hr patch Place 0.2 mg onto the skin once a week.   Yes [provider]  enalapril (VASOTEC) 20 MG tablet Take 20 mg by mouth 2 (two) times  daily.   Yes [provider]  gabapentin (NEURONTIN) 800 MG tablet Take 800 mg by mouth 3 (three) times daily.   Yes [provider]  insulin aspart (NOVOLOG) 100 UNIT/ML FlexPen Inject 60 Units into the skin 2 (two) times daily.    Yes [provider]  methocarbamol (ROBAXIN) 500 MG tablet Take 1 tablet (500 mg total) by mouth 2 (two) times daily. 11/19/19  Yes Khatri, H[place], PA-C  Multiple Vitamins-Minerals (MULTIVITAMIN ADULTS PO) Take 1 tablet by mouth daily.   Yes [provider]   Physical Exam: Vitals:   10/23/20 1500 10/23/20 1600 10/23/20 1733 10/23/20 1752  BP: (!) 153/69 (!) 149/65 (!) 156/74 (!) 156/74  Pulse: 67 68 76 76  Resp:   18   Temp:   98.7 F (37.1 C) 98.7 F (37.1 C)  TempSrc:   Oral   SpO2: 98% 100% 98% 98%  Weight:      Height:       Constitutional: NAD, calm, comfortable Eyes: PERRL, lids and conjunctivae normal ENMT: Mucous membranes are moist. Posterior pharynx clear of any exudate or lesions. Neck: normal, supple, no masses, no thyromegaly Respiratory: clear to auscultation bilaterally, no wheezing, no crackles. Normal respiratory effort. No accessory muscle use.  Cardiovascular: Regular rate and rhythm, no murmurs / rubs / gallops. No extremity edema. 2+ pedal pulses. No carotid bruits.  Abdomen: Obese, no distention.  Bowel sounds positive.  Soft, no tenderness, no masses palpated. No hepatosplenomegaly.  Musculoskeletal: no clubbing / cyanosis. Good ROM, no contractures. Normal muscle tone.  Skin: Left great toe ulcer with small gangrenous area the tip of the toe associated with erythema, calor and erythema.  Please see pictures below. Neurologic: CN 2-12 grossly intact.  Severely impaired sensation on lower extremities.  DTR normal. Strength 5/5 in all 4.  Psychiatric: Normal judgment and insight. Alert and oriented x 3. Normal mood.         Labs on Admission: I have personally reviewed following labs and imaging  studies  CBC: Recent Labs  Lab 10/23/20 1023 10/23/20 1950  WBC 15.9* 17.7*  NEUTROABS 12.3* 13.4*  HGB 11.7* 12.0*  HCT 32.9* 34.4*  MCV 82.9 83.3  PLT 347 672    Basic Metabolic Panel: Recent Labs  Lab 10/23/20 1023 10/23/20 1950  NA 131* 128*  K 3.4* 3.6  CL 92* 91*  CO2 26 22  GLUCOSE 280* 447*  BUN 23 23  CREATININE 2.09* 1.92*  CALCIUM 8.5* 8.2*    GFR: Estimated Creatinine Clearance: 48.3 mL/min (A) (by C-G formula based on SCr of 1.92 mg/dL (H)).  Liver Function Tests: Recent Labs  Lab 10/23/20 1023  AST 29  ALT 27  ALKPHOS 92  BILITOT 1.1  PROT 8.2*  ALBUMIN 3.0*   Radiological Exams on Admission: DG Toe Great Left  Result Date: 10/23/2020 CLINICAL DATA:  Pain, swelling left great toe EXAM: LEFT GREAT TOE COMPARISON:  None. FINDINGS: Lucency in the tip of the left great toe distal phalanx concerning for osteomyelitis. No fracture, subluxation or dislocation. IMPRESSION: Lucency  in the left great toe distal phalanx tip concerning for osteomyelitis. Electronically Signed   By: Rolm Baptise M.D.   On: 10/23/2020 10:52    EKG: Independently reviewed.   Assessment/Plan Principal Problem:   Osteomyelitis of great toe of left foot (HCC) Admit to MedSurg/inpatient. Analgesics as needed. Check ESR, prealbumin and CRP. Check vascular US ABI with and without TBI. Check MRI of left foot to RO osteomyelitis Continue cefepime per pharmacy per Continue vancomycin per pharmacy. I added metronidazole 500 mg p.o. 3 times daily. Continue local care. Consult wound management. Consult TOC. Consult nutritional services. Consult diabetes coordinator. Consult peripheral vascular navigator. Consider orthopedic surgery or podiatry evaluation.  Active Problems:   HTN (hypertension) Continue amlodipine 10 mg p.o. daily. Continue clonidine 0.2 mg patch once a week. Hold enalapril due to elevated creatinine. Monitor BP, renal function and electrolytes.    Type  2 diabetes mellitus with hyperglycemia (HCC) Poor compliance. Needs carbohydrate modified diet. On 70/30 NovoLog at home.  Will hold. Begin Levemir 30 units SQ twice daily. Began CBG monitoring with [facility]. Check hemoglobin A1c.    Hypercholesteremia Continue atorvastatin 80 mg p.o. daily.    Normocytic anemia Likely due to to chronic wound infection Monitor hematocrit hemoglobin.    Hypokalemia Replaced. Follow-up potassium level.    Elevated serum creatinine Unsure at this this AKI, CKD or both in the setting of acute illness. Patient also endorses polyuria and polydipsia. Will hold the ACE inhibitor and monitor renal function/electrolytes.    Mild protein malnutrition ([place]) In the presence of chronic wound ulcer. Consult nutritional services. Diabetic ulcer treatment.    Discharge planning He states that he is Medicare eligible now. Would like to be referred to local PCP due to [facility] difficulties.      DVT prophylaxis: Lovenox [place].  Code Status:   Full code.  Family Communication:   Disposition Plan:   Patient is from:  Home.   Anticipated DC to:  Home.   Anticipated DC date:  10/26/2020.  Anticipated DC barriers: Clinical status and pending work-up.  Consults called:   Admission status:  Inpatient/MedSurg.   Severity of Illness: High due to worsening left great toe ulcer associated with fever, leukocytosis and severely impaired distal sensation from peripheral neuropathy.  The patient will need to remain in the hospital for 24 to 72 hours for IV antibiotic therapy and further work-up to evaluate the presence of osteomyelitis and lower extremities distal vasculature status.  Reubin Milan MD [facility]  How to contact the [facility] Attending or Consulting provider [place] or covering provider during after hours [place], for this patient?   1. Check the care team in [facility] and look for a) attending/consulting [facility] provider listed and b) the [facility] team  listed 2. Log into [email or web address] and use [place]'s universal password to access. If you do not have the password, please contact the hospital operator. 3. Locate the [facility] provider you are looking for under [facility] and page to a number that you can be directly reached. 4. If you still have difficulty reaching the provider, please page the [facility] (Director on Call) for the Hospitalists listed on amion for assistance.  10/23/2020, 9:23 PM   This document was prepared using Paramedic and may contain some unintended transcription errors.

## 2020-10-23 NOTE — ED Notes (Signed)
Family updated on delay for inpatient bed assignment per pt request

## 2020-10-23 NOTE — ED Notes (Signed)
Report given to [facility] RN at [facility]. Pt is going to room 5n07-01

## 2020-10-23 NOTE — Progress Notes (Signed)
2324 Patient went down for MRI of left lower extremity via bed with transport.

## 2020-10-23 NOTE — ED Notes (Signed)
Pt was given food, per Pt request.

## 2020-10-23 NOTE — ED Notes (Signed)
Pedal pulse heard with doppler. Weak with palpation. Warm to touch. His left foot is swollen. The tip of his left great toe is black with exposed skin. Thick pus noted at the tip of his toe. Smell noted. Denies pain. States he has been and is ambulatory as needed.

## 2020-10-23 NOTE — ED Triage Notes (Signed)
Pt arrives with c/o pain to left great toe X1 month. Reports he has had pain in this calf since June. Pt arrives in compressions socks with bandage over toe with drainage coming through both area. Pt toe is discolored. Pt is a diabetic, CBG at triage 250.

## 2020-10-23 NOTE — ED Provider Notes (Signed)
[facility] EMERGENCY DEPARTMENT Provider Note   CSN: [id number] Arrival date & time: 10/23/20  5465     History Chief Complaint  Patient presents with  . Toe Pain    Richard Russell is a 70 y.o. male.  Patient is a 70 year old male with a history of diabetes, hypertension and high cholesterol who is presenting today with worsening pain in his left great toe.  Patient reports about 1 month ago he hit his toe on something and is just gradually being getting more swollen and now has been bleeding and draining.  He has not seen anybody about this but because he was starting to have more pain and swelling in his foot he decided to come today.  He denies any systemic symptoms however did realize that he had a fever upon arrival here.  He has had no nausea or vomiting and has been taking his medications as prescribed.  He does not have much sensation in his toe and denies any significant pain.  The history is provided by the patient.  Toe Pain       Past Medical History:  Diagnosis Date  . Diabetes mellitus without complication (HCC)   . Hypercholesteremia   . Hypertension     Patient Active Problem List   Diagnosis Date Noted  . Diabetes mellitus type II, uncontrolled (HCC) 03/01/2015  . DM2 (diabetes mellitus, type 2) (HCC) 02/26/2015  . HTN (hypertension) 02/26/2015  . Perforated appendicitis 02/24/2015    Past Surgical History:  Procedure Laterality Date  . BACK SURGERY    . LAPAROSCOPIC APPENDECTOMY N/A 02/24/2015   Procedure: APPENDECTOMY LAPAROSCOPIC;  Surgeon: Avel Peace, MD;  Location: [facility] ORS;  Service: General;  Laterality: N/A;       Family History  Problem Relation Age of Onset  . Diabetes Mother   . Heart attack Father     Social History   Tobacco Use  . Smoking status: Never Smoker  . Smokeless tobacco: Never Used  Substance Use Topics  . Alcohol use: No  . Drug use: No    Home Medications Prior to Admission medications    Medication Sig Start Date End Date Taking? Authorizing Provider  amLODipine (NORVASC) 10 MG tablet Take 10 mg by mouth daily.    [provider]  atorvastatin (LIPITOR) 80 MG tablet Take 40 mg by mouth daily.     [provider]  cloNIDine (CATAPRES - DOSED IN MG/24 HR) 0.2 mg/24hr patch Place 0.2 mg onto the skin once a week.    [provider]  enalapril (VASOTEC) 20 MG tablet Take 20 mg by mouth 2 (two) times daily.    [provider]  gabapentin (NEURONTIN) 800 MG tablet Take 800 mg by mouth 3 (three) times daily.    [provider]  insulin aspart (NOVOLOG FLEXPEN) 100 UNIT/ML FlexPen Inject 60 Units into the skin 2 (two) times daily.     [provider]  methocarbamol (ROBAXIN) 500 MG tablet Take 1 tablet (500 mg total) by mouth 2 (two) times daily. 11/19/19   Khatri, Hina, PA-C  Multiple Vitamins-Minerals (MULTIVITAMIN ADULTS PO) Take 1 tablet by mouth daily.    [provider]  zolpidem (AMBIEN) 10 MG tablet Take 10 mg by mouth at bedtime as needed for sleep.    [provider]    Allergies    Patient has no known allergies.  Review of Systems   Review of Systems  All other systems reviewed and are negative.  Physical Exam Updated Vital Signs BP (!) 157/74   Pulse 76   Temp (!) 100.6 F (38.1 C) (Oral)   Resp 18   Ht 6' (1.829 m)   Wt 112 kg   SpO2 98%   BMI 33.50 kg/m   Physical Exam Vitals and nursing note reviewed.  Constitutional:      General: He is not in acute distress.    Appearance: Normal appearance. He is well-developed and well-nourished.  HENT:     Head: Normocephalic and atraumatic.     Mouth/Throat:     Mouth: Oropharynx is clear and moist.  Eyes:     Extraocular Movements: EOM normal.     Conjunctiva/sclera: Conjunctivae normal.     Pupils: Pupils are equal, round, and reactive to light.  Cardiovascular:     Rate and Rhythm: Normal rate and regular rhythm.     Pulses:  Intact distal pulses.     Heart sounds: No murmur heard.   Pulmonary:     Effort: Pulmonary effort is normal. No respiratory distress.     Breath sounds: Normal breath sounds. No wheezing or rales.  Abdominal:     General: There is no distension.     Palpations: Abdomen is soft.     Tenderness: There is no abdominal tenderness. There is no guarding or rebound.  Musculoskeletal:        General: No tenderness or edema. Normal range of motion.     Cervical back: Normal range of motion and neck supple.       Feet:  Skin:    General: Skin is warm and dry.     Findings: No erythema or rash.  Neurological:     Mental Status: He is alert and oriented to person, place, and time. Mental status is at baseline.  Psychiatric:        Mood and Affect: Mood and affect and mood normal.        Behavior: Behavior normal.     ED Results / Procedures / Treatments   Labs (all labs ordered are listed, but only abnormal results are displayed) Labs Reviewed  CBC WITH DIFFERENTIAL/PLATELET - Abnormal; Notable for the following components:      Result Value   WBC 15.9 (*)    RBC 3.97 (*)    Hemoglobin 11.7 (*)    HCT 32.9 (*)    Neutro Abs 12.3 (*)    Monocytes Absolute 1.6 (*)    All other components within normal limits  COMPREHENSIVE METABOLIC PANEL - Abnormal; Notable for the following components:   Sodium 131 (*)    Potassium 3.4 (*)    Chloride 92 (*)    Glucose, Bld 280 (*)    Creatinine, Ser 2.09 (*)    Calcium 8.5 (*)    Total Protein 8.2 (*)    Albumin 3.0 (*)    GFR, Estimated 34 (*)    All other components within normal limits  LACTIC ACID, PLASMA    EKG None  Radiology DG Toe Great Left  Result Date: 10/23/2020 CLINICAL DATA:  Pain, swelling left great toe EXAM: LEFT GREAT TOE COMPARISON:  None. FINDINGS: Lucency in the tip of the left great toe distal phalanx concerning for osteomyelitis. No fracture, subluxation or dislocation. IMPRESSION: Lucency in the left great toe  distal phalanx tip concerning for osteomyelitis. Electronically Signed   By: Charlett Nose M.D.   On: 10/23/2020 10:52    Procedures Procedures   Medications Ordered in ED Medications  acetaminophen (TYLENOL) tablet 1,000 mg (1,000 mg Oral Given 10/23/20 1030)    ED Course  I have reviewed the triage vital signs and the nursing notes.  Pertinent labs & imaging results that were available during my care of the patient were reviewed by me and considered in my medical decision making (see chart for details).    MDM Rules/Calculators/A&P                          Patient presenting today with 1 month of worsening wound to the left toe.  This happened after he hit it against something.  Patient has obvious diabetic wound to the left toe which is foul-smelling and purulent drainage.  He is febrile today at 100.6 with a leukocytosis of 15,000.  Concern for osteo-versus infected wound without bone involvement.  Patient does not appear systemically ill at this time.  Creatinine today is 2 however last compared creatinine was 6 years ago at that time it was 1.19.  Feel that patient will need admission for antibiotics and debridement of the toe.  X-ray shows concern for early osteo.  Will cover the vanc and cefepime per hospitalist  MDM Number of Diagnoses or Management Options   Amount and/or Complexity of Data Reviewed Clinical lab tests: ordered and reviewed Tests in the radiology section of CPT: ordered and reviewed Decide to obtain previous medical records or to obtain history from someone other than the patient: yes Obtain history from someone other than the patient: yes Review and summarize past medical records: yes Discuss the patient with other providers: yes Independent visualization of images, tracings, or specimens: yes  Risk of Complications, Morbidity, and/or Mortality Presenting problems: moderate Diagnostic procedures: moderate Management options: moderate  Patient  Progress Patient progress: stable   Final Clinical Impression(s) / ED Diagnoses Final diagnoses:  Open toe wound, initial encounter  Other acute osteomyelitis of left foot [facility])    Rx / DC Orders ED Discharge Orders    None       Gwyneth Sprout, MD 10/23/20 1205

## 2020-10-24 ENCOUNTER — Inpatient Hospital Stay (HOSPITAL_COMMUNITY): Payer: Medicare Other

## 2020-10-24 DIAGNOSIS — M869 Osteomyelitis, unspecified: Secondary | ICD-10-CM | POA: Diagnosis not present

## 2020-10-24 DIAGNOSIS — L0390 Cellulitis, unspecified: Secondary | ICD-10-CM | POA: Diagnosis not present

## 2020-10-24 LAB — BASIC METABOLIC PANEL
Anion gap: 11 (ref 5–15)
BUN: 21 mg/dL (ref 8–23)
CO2: 26 mmol/L (ref 22–32)
Calcium: 8.1 mg/dL — ABNORMAL LOW (ref 8.9–10.3)
Chloride: 91 mmol/L — ABNORMAL LOW (ref 98–111)
Creatinine, Ser: 1.82 mg/dL — ABNORMAL HIGH (ref 0.61–1.24)
GFR, Estimated: 40 mL/min — ABNORMAL LOW (ref >60)
Glucose, Bld: 378 mg/dL — ABNORMAL HIGH (ref 70–99)
Potassium: 3.3 mmol/L — ABNORMAL LOW (ref 3.5–5.1)
Sodium: 128 mmol/L — ABNORMAL LOW (ref 135–145)

## 2020-10-24 LAB — CBC
HCT: 30 % — ABNORMAL LOW (ref 39.0–52.0)
Hemoglobin: 11.3 g/dL — ABNORMAL LOW (ref 13.0–17.0)
MCH: 30.5 pg (ref 26.0–34.0)
MCHC: 37.7 g/dL — ABNORMAL HIGH (ref 30.0–36.0)
MCV: 80.9 fL (ref 80.0–100.0)
Platelets: 337 10*3/uL (ref 150–400)
RBC: 3.71 MIL/uL — ABNORMAL LOW (ref 4.22–5.81)
RDW: 11.6 % (ref 11.5–15.5)
WBC: 14.1 10*3/uL — ABNORMAL HIGH (ref 4.0–10.5)
nRBC: 0 % (ref 0.0–0.2)

## 2020-10-24 LAB — GLUCOSE, CAPILLARY
Glucose-Capillary: 269 mg/dL — ABNORMAL HIGH (ref 70–99)
Glucose-Capillary: 283 mg/dL — ABNORMAL HIGH (ref 70–99)
Glucose-Capillary: 221 mg/dL — ABNORMAL HIGH (ref 70–99)
Glucose-Capillary: 184 mg/dL — ABNORMAL HIGH (ref 70–99)

## 2020-10-24 MED ORDER — SODIUM CHLORIDE 0.9 % IV SOLN: INTRAVENOUS | Status: AC

## 2020-10-24 MED ORDER — ADULT MULTIVITAMIN W/MINERALS CH
1.0000 | ORAL_TABLET | Freq: Every day | ORAL | Status: DC
  Administered 2020-10-24 – 2020-10-31 (×7): 1 via ORAL
  Filled 2020-10-24 (×7): qty 1

## 2020-10-24 MED ORDER — INSULIN DETEMIR 100 UNIT/ML ~~LOC~~ SOLN
35.0000 [IU] | Freq: Two times a day (BID) | SUBCUTANEOUS | Status: DC
  Administered 2020-10-24 – 2020-10-26 (×4): 35 [IU] via SUBCUTANEOUS
  Filled 2020-10-24 (×5): qty 0.35

## 2020-10-24 MED ORDER — POTASSIUM CHLORIDE CRYS ER 20 MEQ PO TBCR
20.0000 meq | EXTENDED_RELEASE_TABLET | Freq: Once | ORAL | Status: AC
  Administered 2020-10-24: 20 meq via ORAL
  Filled 2020-10-24: qty 1

## 2020-10-24 MED ORDER — INSULIN ASPART 100 UNIT/ML ~~LOC~~ SOLN
5.0000 [IU] | Freq: Three times a day (TID) | SUBCUTANEOUS | Status: DC
  Administered 2020-10-24 – 2020-10-25 (×3): 5 [IU] via SUBCUTANEOUS

## 2020-10-24 MED ORDER — PROSOURCE PLUS PO LIQD
30.0000 mL | Freq: Every day | ORAL | Status: DC
  Administered 2020-10-24 – 2020-10-31 (×8): 30 mL via ORAL
  Filled 2020-10-24 (×8): qty 30

## 2020-10-24 MED ORDER — ENSURE MAX PROTEIN PO LIQD
11.0000 [oz_av] | Freq: Two times a day (BID) | ORAL | Status: DC
  Administered 2020-10-24 – 2020-10-31 (×12): 11 [oz_av] via ORAL
  Filled 2020-10-24: qty 330

## 2020-10-24 NOTE — Consult Note (Signed)
Brief orthopedic consult note. Full consult note to follow.  70 year old male with history of uncontrolled diabetes, elevated hemoglobin A1c with neuropathy presented with left great toe swelling, drainage and ulceration.  MRI consistent with destruction of the distal phalanx and osteomyelitis.  Orthopedics was consulted.  Based on MRI and x-ray evidence of bony destruction patient will require partial hallux amputation for definitive treatment.  Patient is not currently n.p.o.  Will make patient n.p.o. past midnight with plan for surgery in the a.m.  Continue antibiotics for now.  We will send cultures at the time of surgery.

## 2020-10-24 NOTE — Progress Notes (Signed)
Inpatient Diabetes Program Recommendations  AACE/ADA: New Consensus Statement on Inpatient Glycemic Control (2015)  Target Ranges:  Prepandial:   less than 140 mg/dL      Peak postprandial:   less than 180 mg/dL (1-2 hours)      Critically ill patients:  140 - 180 mg/dL   Results for Richard Russell, Richard Russell" (MRN [id number]) as of 10/24/2020 15:17  Ref. Range 10/23/2020 09:31 10/23/2020 21:47 10/24/2020 07:25 10/24/2020 12:51  Glucose-Capillary Latest Ref Range: 70 - 99 mg/dL 956 (H) 387 (H)  30 units LEVEMIR @10 :13pm  20 units NOVOLOG @12 :24am 269 (H)  8 units NOVOLOG   30 units LEVEMIR  283 (H)  13 units NOVOLOG    Results for Richard Russell, Richard Russell" (MRN ) as of 10/24/2020 15:17  Ref. Range 10/23/2020 19:50  Hemoglobin A1C Latest Ref Range: 4.8 - 5.6 % 10.6 (H)  (257 mg/dl)    Admit with: Osteomyelitis of great toe of left foot   History: DM  Home DM Meds: 70/30 Insulin 60 units BID  Current Orders: Levemir 35 units BID      Novolog Moderate Correction Scale/ SSI (0-15 units) TID AC      Novolog 5 units TID with meals    Note Levemir dose increased this AM  Also note Novolog Meal Coverage started at 12pm today with Lunch  Agree with upward adjustments and addition of meal coverage  Needs better CBG control at home--May benefit from basal-bolus insulin regimen over twice daily 70/30 insulin, however, unsure pt would be willing to take more than 2 shots of insulin per day.  Will follow and assist while hospitalized.     --Will follow patient during hospitalization--  12/24/2020 RN, MSN, CDE Diabetes Coordinator Inpatient Glycemic Control Team Team Pager: [phone] (8a-5p)

## 2020-10-24 NOTE — Progress Notes (Signed)
VASCULAR LAB    ABIs have been performed.  See CV proc for preliminary results.   Senie Lanese, RVT 10/24/2020, 10:47 AM

## 2020-10-24 NOTE — Progress Notes (Signed)
PROGRESS NOTE  VIVEK GREALISH ZOX:[id number] DOB: 1951-05-23 DOA: 10/23/2020 PCP: [facility]  HPI/Recap of past 24 hours: Richard Russell is a 70 y.o. male with medical history significant of hypercholesterolemia, hypertension, type 2 diabetes mellitus with renal manifestations, diabetic peripheral neuropathy, class I obesity, history of back surgery with occasional back pain exacerbation who is sent from [facility] after presenting there with 1 month history of left great toe injury, which turned into an ulcer that he has been managing with unknown OTC spray and pressure stockings.  He recently reinjured the area developing more erythema, edema, calor and purulent discharge.  He does not complain of significant pain because decreased sensation secondary to diabetic peripheral neuropathy.      Work-up revealed left great toe osteomyelitis, diagnosed with MRI.  ESR 127 and CRP 34 also consistent with osteomyelitis.  He is currently on broad-spectrum IV antibiotics IV vancomycin, cefepime, p.o. Flagyl.  Will obtain blood cultures x2 peripherally in the setting of sepsis.  Orthopedic surgery consulted, Dr. Lucia Gaskins, we appreciate assistance.    Assessment/Plan: Principal Problem:   Osteomyelitis of great toe of left foot (HCC) Active Problems:   HTN (hypertension)   Type 2 diabetes mellitus with hyperglycemia (HCC)   Hypercholesteremia   Normocytic anemia   Hypokalemia   Elevated serum creatinine   Mild protein malnutrition (HCC)  Sepsis secondary to left great toe osteomyelitis, seen on MRI 10/24/2020. Presented with fever with T-max of 100.6, leukocytosis and evidence of left great toe osteomyelitis on MRI left foot. ESR 127, CRP 34 on 10/23/2020 He is currently on broad-spectrum IV antibiotics IV vancomycin, cefepime, p.o. Flagyl.  Will obtain blood cultures x2 peripherally in the setting of sepsis. Orthopedic surgery consulted, appreciate assistance. Monitor fever curve and WBC Obtain  CBC in the morning.  Hypovolemic hyponatremia Presented with serum sodium 131, downtrending to 128. He is currently on IV fluid hydration half-normal saline at 100 cc/h We will switch IV fluid to normal saline at 50 cc/h x 2 days.  Hypokalemia Serum potassium 3.3 Repleted orally. Recheck in the morning. Also obtain magnesium level.  Type 2 diabetes with hyperglycemia Hemoglobin A1c 10.6 on 10/23/2020 CBGs are not at goal. He is currently on Levemir 30 units twice daily and insulin sliding scale Increase Levemir to 25 units twice daily Add NovoLog 5 units before meals Continue insulin sliding scale.  Essential hypertension BP stable Continue Norvasc 10 mg daily, clonidine patch 0.2 mg every 7 days. Continue to monitor vital signs.  Hyperlipidemia Continue home Lipitor.  Diabetes polyneuropathy Continue home gabapentin  Code Status: Full code.  Family Communication: None at bedside.  Disposition Plan: Likely will discharge to home once orthopedic surgery signs off.   Consultants:  Orthopedic surgery.  Procedures:  None at this time.  Antimicrobials:  IV vancomycin, cefepime, p.o. Flagyl, day #2.  DVT prophylaxis: Subcu enoxaparin daily.  Status is: Inpatient    Dispo:  Patient From: Home  Planned Disposition: Home   Anticipated discharge date: 10/27/2020 or when orthopedic surgery signs off.  Medically stable for discharge: No, ongoing management of left great toe osteomyelitis.          Objective: Vitals:   10/23/20 1733 10/23/20 1752 10/24/20 0400 10/24/20 0900  BP: (!) 156/74 (!) 156/74 (!) 148/69 (!) 144/54  Pulse: 76 76 74 86  Resp: _0 Temp: 98.7 F (37.1 C) 98.7 F (37.1 C) 98.5 F (36.9 C) 98 F (36.7 C)  TempSrc: Oral  Oral Oral  SpO2: 98% 98% 100% 100%  Weight:      Height:        Intake/Output Summary (Last 24 hours) at 10/24/2020 1217 Last data filed at 10/23/2020 1544 Gross per 24 hour  Intake 324.16 ml  Output --   Net 324.16 ml   Filed Weights   10/23/20 0934  Weight: 112 kg    Exam:  . General: 70 y.o. year-old male well developed well nourished in no acute distress.  Alert and oriented x3. . Cardiovascular: Regular rate and rhythm with no rubs or gallops.  No thyromegaly or JVD noted.   Marland Kitchen Respiratory: Clear to auscultation with no wheezes or rales. Good inspiratory effort. . Abdomen: Soft nontender nondistended with normal bowel sounds x4 quadrants. . Musculoskeletal: Trace lower extremity edema bilaterally. . Skin: No ulcerative lesion affecting left great toe. Marland Kitchen Psychiatry: Mood is appropriate for condition and setting   Data Reviewed: CBC: Recent Labs  Lab 10/23/20 1023 10/23/20 1950 10/24/20 0201  WBC 15.9* 17.7* 14.1*  NEUTROABS 12.3* 13.4*  --   HGB 11.7* 12.0* 11.3*  HCT 32.9* 34.4* 30.0*  MCV 82.9 83.3 80.9  PLT 347 348 403   Basic Metabolic Panel: Recent Labs  Lab 10/23/20 1023 10/23/20 1950 10/24/20 0201  NA 131* 128* 128*  K 3.4* 3.6 3.3*  CL 92* 91* 91*  CO2 _0 GLUCOSE 280* 447* 378*  BUN _1 CREATININE 2.09* 1.92* 1.82*  CALCIUM 8.5* 8.2* 8.1*   GFR: Estimated Creatinine Clearance: 50.9 mL/min (A) (by C-G formula based on SCr of 1.82 mg/dL (H)). Liver Function Tests: Recent Labs  Lab 10/23/20 1023  AST 29  ALT 27  ALKPHOS 92  BILITOT 1.1  PROT 8.2*  ALBUMIN 3.0*   No results for input(s): LIPASE, AMYLASE in the last 168 hours. No results for input(s): AMMONIA in the last 168 hours. Coagulation Profile: No results for input(s): INR, PROTIME in the last 168 hours. Cardiac Enzymes: No results for input(s): CKTOTAL, CKMB, CKMBINDEX, TROPONINI in the last 168 hours. BNP (last 3 results) No results for input(s): PROBNP in the last 8760 hours. HbA1C: Recent Labs    10/23/20 1950  HGBA1C 10.6*   CBG: Recent Labs  Lab 10/23/20 0931 10/23/20 2147 10/24/20 0725  GLUCAP 250* 429* 269*   Lipid Profile: No results for input(s):  CHOL, HDL, LDLCALC, TRIG, CHOLHDL, LDLDIRECT in the last 72 hours. Thyroid Function Tests: No results for input(s): TSH, T4TOTAL, FREET4, T3FREE, THYROIDAB in the last 72 hours. Anemia Panel: No results for input(s): VITAMINB12, FOLATE, FERRITIN, TIBC, IRON, RETICCTPCT in the last 72 hours. Urine analysis:    Component Value Date/Time   COLORURINE YELLOW 10/23/2020 1935   APPEARANCEUR CLEAR 10/23/2020 1935   LABSPEC 1.017 10/23/2020 1935   PHURINE 5.0 10/23/2020 1935   GLUCOSEU >=500 (A) 10/23/2020 1935   HGBUR SMALL (A) 10/23/2020 1935   BILIRUBINUR NEGATIVE 10/23/2020 1935   KETONESUR 5 (A) 10/23/2020 1935   PROTEINUR 30 (A) 10/23/2020 1935   UROBILINOGEN 0.2 02/24/2015 1600   NITRITE NEGATIVE 10/23/2020 1935   LEUKOCYTESUR NEGATIVE 10/23/2020 1935   Sepsis Labs: _2 (procalcitonin:4,lacticidven:4)  ) Recent Results (from the past 240 hour(s))  SARS CORONAVIRUS 2 (TAT 6-24 HRS) Nasopharyngeal Nasopharyngeal Swab     Status: None   Collection Time: 10/23/20 11:50 AM   Specimen: Nasopharyngeal Swab  Result Value Ref Range Status   SARS Coronavirus 2 NEGATIVE NEGATIVE Final    Comment: (NOTE) SARS-CoV-2 target  nucleic acids are NOT DETECTED.  The SARS-CoV-2 RNA is generally detectable in upper and lower respiratory specimens during the acute phase of infection. Negative results do not preclude SARS-CoV-2 infection, do not rule out co-infections with other pathogens, and should not be used as the sole basis for treatment or other patient management decisions. Negative results must be combined with clinical observations, patient history, and epidemiological information. The expected result is Negative.  Fact Sheet for Patients: SugarRoll.be  Fact Sheet for Healthcare Providers: https://www.woods-mathews.com/  This test is not yet approved or cleared by the [place] FDA and  has been authorized for detection and/or  diagnosis of SARS-CoV-2 by FDA under an Emergency Use Authorization (EUA). This EUA will remain  in effect (meaning this test can be used) for the duration of the COVID-19 declaration under Se ction 564(b)(1) of the Act, 21 U.S.C. section 360bbb-3(b)(1), unless the authorization is terminated or revoked sooner.  Performed at [facility], [place] [address] [place], [place] [address]       Studies: MR FOOT LEFT WO CONTRAST  Result Date: 10/24/2020 CLINICAL DATA:  Diabetic patient with a skin ulceration on the left great toe EXAM: MRI OF THE LEFT FOOT WITHOUT CONTRAST TECHNIQUE: Multiplanar, multisequence MR imaging of the left foot was performed. No intravenous contrast was administered. COMPARISON:  Plain films left great toe 10/23/2020. FINDINGS: Bones/Joint/Cartilage Marrow edema is seen throughout the distal and phalanx of the great toe and the tuft of the distal phalanx appears mildly fragmented. No other evidence of osteomyelitis is identified. The patient has degenerative change at the first MTP joint with secondary marrow edema in the tibial sesamoid and overlying head of the first metatarsal. No fracture stress change is identified. No joint effusion. Ligaments Intact. Muscles and Tendons No intramuscular fluid collection or mass. There is some fatty atrophy of intrinsic musculature of the foot. Soft tissues No abscess is identified. Skin ulceration and soft tissue swelling great toe noted. IMPRESSION: Findings consistent with osteomyelitis throughout the distal phalanx of the great toe. Negative for septic joint or myositis. First MTP osteoarthritis. Electronically Signed   By: Inge Rise M.D.   On: 10/24/2020 09:34   VAS [place] ABI WITH/WO TBI  Result Date: 10/24/2020 LOWER EXTREMITY DOPPLER STUDY Indications: Ulceration. High Risk Factors: Hypertension, hyperlipidemia, Diabetes.  Limitations: Today's exam was limited due to rapid heart rate and edema in              ankles.  Comparison Study: No prior study Performing Technologist: Sharion Dove RVS  Examination Guidelines: A complete evaluation includes at minimum, Doppler waveform signals and systolic blood pressure reading at the level of bilateral brachial, anterior tibial, and posterior tibial arteries, when vessel segments are accessible. Bilateral testing is considered an integral part of a complete examination. Photoelectric Plethysmograph (PPG) waveforms and toe systolic pressure readings are included as required and additional duplex testing as needed. Limited examinations for reoccurring indications may be performed as noted.  ABI Findings: +---------+------------------+-----+-----------+--------+ Right    Rt Pressure (mmHg)IndexWaveform   Comment  +---------+------------------+-----+-----------+--------+ Brachial 178                    multiphasic         +---------+------------------+-----+-----------+--------+ PTA      189               1.06 multiphasic         +---------+------------------+-----+-----------+--------+ DP       141  0.79 multiphasic         +---------+------------------+-----+-----------+--------+ Great Toe129               0.72                     +---------+------------------+-----+-----------+--------+ +---------+------------------+-----+-----------+-------------------------------+ Left     Lt Pressure (mmHg)IndexWaveform   Comment                         +---------+------------------+-----+-----------+-------------------------------+ Brachial 177                    multiphasic                                +---------+------------------+-----+-----------+-------------------------------+ PTA      181               1.02 multiphasic                                +---------+------------------+-----+-----------+-------------------------------+ DP       180               1.01 multiphasic                                 +---------+------------------+-----+-----------+-------------------------------+ Great Toe119               0.67            2nd toe pressure, great toe                                                with ulcer                      +---------+------------------+-----+-----------+-------------------------------+ +-------+-----------+-----------+------------+------------+ ABI/TBIToday's ABIToday's TBIPrevious ABIPrevious TBI +-------+-----------+-----------+------------+------------+ Right  1.06       0.72                                +-------+-----------+-----------+------------+------------+ Left   1.02       0.67                                +-------+-----------+-----------+------------+------------+  Summary: Right: Resting right ankle-brachial index is within normal range. No evidence of significant right lower extremity arterial disease. The right toe-brachial index is normal. Left: Resting left ankle-brachial index is within normal range. No evidence of significant left lower extremity arterial disease. The left toe-brachial index is normal. TBI done on 2nd toe secondary to great toe ulceration.  *See table(s) above for measurements and observations.     Preliminary     Scheduled Meds: . amLODipine  10 mg Oral Daily  . atorvastatin  40 mg Oral Daily  . cloNIDine  0.2 mg Transdermal Weekly  . enoxaparin (LOVENOX) injection  55 mg Subcutaneous QHS  . gabapentin  800 mg Oral TID  . insulin aspart  0-15 Units Subcutaneous TID WC  . insulin detemir  30 Units Subcutaneous BID  . methocarbamol  500 mg Oral BID  . metroNIDAZOLE  500  mg Oral Q8H    Continuous Infusions: . sodium chloride 100 mL/hr at 10/23/20 1951  . sodium chloride Stopped (10/23/20 1544)  . ceFEPime (MAXIPIME) IV 2 g (10/24/20 0829)  . vancomycin       LOS: 1 day     Kayleen Memos, MD [facility] Pager [phone]  If 7PM-7AM, please contact night-coverage [email or web address] Password  [facility] 10/24/2020, 12:17 PM

## 2020-10-24 NOTE — Progress Notes (Addendum)
Initial Nutrition Assessment  DOCUMENTATION CODES:   Obesity unspecified  INTERVENTION:    Ensure Max po BID, each supplement provides 150 kcal and 30 grams of protein.   Prosource Plus 30 ml PO once daily, each packet provides 100 kcal and 15 gm protein.  MVI with minerals daily  NUTRITION DIAGNOSIS:   Increased nutrient needs related to wound healing as evidenced by estimated needs.  GOAL:   Patient will meet greater than or equal to 90% of their needs  MONITOR:   PO intake,Supplement acceptance,Labs,Skin  REASON FOR ASSESSMENT:   Consult Wound healing  ASSESSMENT:   70 yo male admitted with wound to great toe on left foot. PMH includes hypercholesterolemia, HTN, DM-2.   MRI of left foot on 3/4 showed osteomyelitis throughout the distal phalanx of the great toe. Unable to speak with patient today. No answer when hospital room phone number called.  From chart review, patient's weight has been stable over the past year. He is currently on a heart healthy CHO modified diet. Meal intakes not recorded. No nutrition issues noted PTA. Low prealbumin reflects acute inflammatory process as evidenced by elevated CRP. Prealbumin and albumin levels are not good indicators of nutrition status in hospitalized patients. Patient will require increased protein intake to support wound healing. Will add PO protein supplements.  Labs reviewed. Sodium 128, potassium 3.3, creatinine 1.82, prealbumin 6.5, CRP 34.6 CBG: 429-269  Medications reviewed and include Novolog SSI TID with meals, Levemir. IVF changed to NS at 50 ml/h. Receiving PO KCl for potassium repletion.  NUTRITION - FOCUSED PHYSICAL EXAM:  unable to complete  Diet Order:   Diet Order            Diet heart healthy/carb modified Room service appropriate? Yes; Fluid consistency: Thin  Diet effective now                 EDUCATION NEEDS:   Not appropriate for education at this time  Skin:  Skin Assessment:  Skin Integrity Issues: Skin Integrity Issues:: Diabetic Ulcer Diabetic Ulcer: Left great toe  Last BM:  3/4  Height:   Ht Readings from Last 1 Encounters:  10/23/20 6' (1.829 m)    Weight:   Wt Readings from Last 1 Encounters:  10/23/20 112 kg    Ideal Body Weight:  80.9 kg  BMI:  Body mass index is 33.5 kg/m.  Estimated Nutritional Needs:   Kcal:  2300-2500  Protein:  140-160 gm  Fluid:  >/= 2.3 L    Gabriel Rainwater, RD, LDN, CNSC Please refer to Amion for contact information.

## 2020-10-25 ENCOUNTER — Inpatient Hospital Stay (HOSPITAL_COMMUNITY): Payer: Medicare Other | Admitting: Certified Registered Nurse Anesthetist

## 2020-10-25 ENCOUNTER — Encounter (HOSPITAL_COMMUNITY): Payer: Self-pay | Admitting: Internal Medicine

## 2020-10-25 ENCOUNTER — Encounter (HOSPITAL_COMMUNITY)
Admission: EM | Disposition: A | Discharge status: Home Health | Payer: Self-pay | Source: Home / Self Care | Attending: Internal Medicine

## 2020-10-25 DIAGNOSIS — M869 Osteomyelitis, unspecified: Secondary | ICD-10-CM | POA: Diagnosis not present

## 2020-10-25 HISTORY — PX: AMPUTATION TOE: SHX6595

## 2020-10-25 LAB — CBC
HCT: 28.4 % — ABNORMAL LOW (ref 39.0–52.0)
Hemoglobin: 10.5 g/dL — ABNORMAL LOW (ref 13.0–17.0)
MCH: 30.1 pg (ref 26.0–34.0)
MCHC: 37 g/dL — ABNORMAL HIGH (ref 30.0–36.0)
MCV: 81.4 fL (ref 80.0–100.0)
Platelets: 366 10*3/uL (ref 150–400)
RBC: 3.49 MIL/uL — ABNORMAL LOW (ref 4.22–5.81)
RDW: 11.6 % (ref 11.5–15.5)
WBC: 15.8 10*3/uL — ABNORMAL HIGH (ref 4.0–10.5)
nRBC: 0 % (ref 0.0–0.2)

## 2020-10-25 LAB — GLUCOSE, CAPILLARY
Glucose-Capillary: 269 mg/dL — ABNORMAL HIGH (ref 70–99)
Glucose-Capillary: 263 mg/dL — ABNORMAL HIGH (ref 70–99)
Glucose-Capillary: 250 mg/dL — ABNORMAL HIGH (ref 70–99)
Glucose-Capillary: 197 mg/dL — ABNORMAL HIGH (ref 70–99)
Glucose-Capillary: 264 mg/dL — ABNORMAL HIGH (ref 70–99)
Glucose-Capillary: 192 mg/dL — ABNORMAL HIGH (ref 70–99)

## 2020-10-25 LAB — BASIC METABOLIC PANEL
Anion gap: 12 (ref 5–15)
BUN: 19 mg/dL (ref 8–23)
CO2: 25 mmol/L (ref 22–32)
Calcium: 8 mg/dL — ABNORMAL LOW (ref 8.9–10.3)
Chloride: 93 mmol/L — ABNORMAL LOW (ref 98–111)
Creatinine, Ser: 1.84 mg/dL — ABNORMAL HIGH (ref 0.61–1.24)
GFR, Estimated: 40 mL/min — ABNORMAL LOW (ref >60)
Glucose, Bld: 317 mg/dL — ABNORMAL HIGH (ref 70–99)
Potassium: 3.3 mmol/L — ABNORMAL LOW (ref 3.5–5.1)
Sodium: 130 mmol/L — ABNORMAL LOW (ref 135–145)

## 2020-10-25 LAB — MAGNESIUM: Magnesium: 1.9 mg/dL (ref 1.7–2.4)

## 2020-10-25 LAB — PHOSPHORUS: Phosphorus: 2.4 mg/dL — ABNORMAL LOW (ref 2.5–4.6)

## 2020-10-25 SURGERY — AMPUTATION, TOE
Anesthesia: Monitor Anesthesia Care | Site: Toe | Laterality: Left

## 2020-10-25 MED ORDER — MIDAZOLAM HCL 5 MG/5ML IJ SOLN
INTRAMUSCULAR | Status: DC | PRN
  Administered 2020-10-25: 2 mg via INTRAVENOUS

## 2020-10-25 MED ORDER — 0.9 % SODIUM CHLORIDE (POUR BTL) OPTIME
TOPICAL | Status: DC | PRN
  Administered 2020-10-25: 1000 mL

## 2020-10-25 MED ORDER — ONDANSETRON HCL 4 MG/2ML IJ SOLN
INTRAMUSCULAR | Status: AC
  Filled 2020-10-25: qty 2

## 2020-10-25 MED ORDER — FENTANYL CITRATE (PF) 250 MCG/5ML IJ SOLN
INTRAMUSCULAR | Status: AC
  Filled 2020-10-25: qty 5

## 2020-10-25 MED ORDER — LACTATED RINGERS IV SOLN: INTRAVENOUS | Status: DC

## 2020-10-25 MED ORDER — FENTANYL CITRATE (PF) 100 MCG/2ML IJ SOLN
INTRAMUSCULAR | Status: DC | PRN
  Administered 2020-10-25 (×2): 50 ug via INTRAVENOUS

## 2020-10-25 MED ORDER — BUPIVACAINE HCL (PF) 0.25 % IJ SOLN
INTRAMUSCULAR | Status: AC
  Filled 2020-10-25: qty 20

## 2020-10-25 MED ORDER — MIDAZOLAM HCL 2 MG/2ML IJ SOLN
INTRAMUSCULAR | Status: AC
  Filled 2020-10-25: qty 2

## 2020-10-25 MED ORDER — CHLORHEXIDINE GLUCONATE 0.12 % MT SOLN
OROMUCOSAL | Status: AC
  Filled 2020-10-25: qty 15

## 2020-10-25 MED ORDER — HYDROMORPHONE HCL 1 MG/ML IJ SOLN
0.2500 mg | INTRAMUSCULAR | Status: DC | PRN
Start: 2020-10-25 — End: 2020-10-25

## 2020-10-25 MED ORDER — CEFAZOLIN SODIUM-DEXTROSE 2-4 GM/100ML-% IV SOLN
INTRAVENOUS | Status: AC
  Filled 2020-10-25: qty 100

## 2020-10-25 MED ORDER — PROPOFOL 10 MG/ML IV BOLUS
INTRAVENOUS | Status: DC | PRN
  Administered 2020-10-25: 15 mg via INTRAVENOUS

## 2020-10-25 MED ORDER — INSULIN ASPART 100 UNIT/ML ~~LOC~~ SOLN
7.0000 [IU] | Freq: Three times a day (TID) | SUBCUTANEOUS | Status: DC
  Administered 2020-10-25 – 2020-10-28 (×8): 7 [IU] via SUBCUTANEOUS

## 2020-10-25 MED ORDER — CHLORHEXIDINE GLUCONATE 0.12 % MT SOLN
15.0000 mL | OROMUCOSAL | Status: AC
  Filled 2020-10-25 (×2): qty 15

## 2020-10-25 MED ORDER — INSULIN ASPART 100 UNIT/ML ~~LOC~~ SOLN: 10.0000 [IU] | Freq: Three times a day (TID) | SUBCUTANEOUS | Status: DC

## 2020-10-25 MED ORDER — BUPIVACAINE HCL (PF) 0.5 % IJ SOLN
INTRAMUSCULAR | Status: DC | PRN
  Administered 2020-10-25: 40 mL via PERINEURAL

## 2020-10-25 MED ORDER — PROPOFOL 10 MG/ML IV BOLUS
INTRAVENOUS | Status: AC
  Filled 2020-10-25: qty 20

## 2020-10-25 MED ORDER — PHENYLEPHRINE 40 MCG/ML (10ML) SYRINGE FOR IV PUSH (FOR BLOOD PRESSURE SUPPORT)
PREFILLED_SYRINGE | INTRAVENOUS | Status: AC
  Filled 2020-10-25: qty 10

## 2020-10-25 MED ORDER — OXYCODONE HCL 5 MG/5ML PO SOLN: 5.0000 mg | Freq: Once | ORAL | Status: DC | PRN

## 2020-10-25 MED ORDER — SODIUM CHLORIDE 0.9 % IV BOLUS
500.0000 mL | Freq: Once | INTRAVENOUS | Status: AC
  Administered 2020-10-25: 500 mL via INTRAVENOUS

## 2020-10-25 MED ORDER — MEPERIDINE HCL 25 MG/ML IJ SOLN: 6.2500 mg | INTRAMUSCULAR | Status: DC | PRN

## 2020-10-25 MED ORDER — STERILE WATER FOR IRRIGATION IR SOLN
Status: DC | PRN
  Administered 2020-10-25: 1000 mL

## 2020-10-25 MED ORDER — PROPOFOL 500 MG/50ML IV EMUL
INTRAVENOUS | Status: DC | PRN
  Administered 2020-10-25: 100 ug/kg/min via INTRAVENOUS

## 2020-10-25 MED ORDER — CEFAZOLIN SODIUM-DEXTROSE 2-3 GM-%(50ML) IV SOLR
INTRAVENOUS | Status: DC | PRN
  Administered 2020-10-25: 2 g via INTRAVENOUS

## 2020-10-25 MED ORDER — MIDAZOLAM HCL 2 MG/2ML IJ SOLN: 0.5000 mg | Freq: Once | INTRAMUSCULAR | Status: DC | PRN

## 2020-10-25 MED ORDER — PROMETHAZINE HCL 25 MG/ML IJ SOLN
6.2500 mg | INTRAMUSCULAR | Status: DC | PRN
Start: 2020-10-25 — End: 2020-10-25

## 2020-10-25 MED ORDER — OXYCODONE HCL 5 MG PO TABS: 5.0000 mg | ORAL_TABLET | Freq: Once | ORAL | Status: DC | PRN

## 2020-10-25 MED ORDER — POTASSIUM CHLORIDE CRYS ER 20 MEQ PO TBCR
40.0000 meq | EXTENDED_RELEASE_TABLET | Freq: Every day | ORAL | Status: AC
  Administered 2020-10-25 – 2020-10-27 (×3): 40 meq via ORAL
  Filled 2020-10-25 (×3): qty 2

## 2020-10-25 MED ORDER — FENTANYL CITRATE (PF) 100 MCG/2ML IJ SOLN
INTRAMUSCULAR | Status: AC
  Filled 2020-10-25: qty 2

## 2020-10-25 MED ORDER — ONDANSETRON HCL 4 MG/2ML IJ SOLN
INTRAMUSCULAR | Status: DC | PRN
  Administered 2020-10-25: 4 mg via INTRAVENOUS

## 2020-10-25 SURGICAL SUPPLY — 31 items
BNDG ELASTIC 4X5.8 VLCR STR LF (GAUZE/BANDAGES/DRESSINGS) ×2 IMPLANT
BNDG ESMARK 4X9 LF (GAUZE/BANDAGES/DRESSINGS) IMPLANT
BNDG GAUZE ELAST 4 BULKY (GAUZE/BANDAGES/DRESSINGS) ×2 IMPLANT
COVER SURGICAL LIGHT HANDLE (MISCELLANEOUS) ×4 IMPLANT
COVER WAND RF STERILE (DRAPES) IMPLANT
DRAPE U-SHAPE 47X51 STRL (DRAPES) ×2 IMPLANT
DRSG XEROFORM 1X8 (GAUZE/BANDAGES/DRESSINGS) IMPLANT
DURAPREP 26ML APPLICATOR (WOUND CARE) ×2 IMPLANT
ELECT REM PT RETURN 9FT ADLT (ELECTROSURGICAL) ×2
ELECTRODE REM PT RTRN 9FT ADLT (ELECTROSURGICAL) ×1 IMPLANT
GAUZE SPONGE 4X4 12PLY STRL (GAUZE/BANDAGES/DRESSINGS) IMPLANT
GLOVE BIOGEL M STRL SZ7.5 (GLOVE) ×2 IMPLANT
GLOVE INDICATOR 8.0 STRL GRN (GLOVE) ×2 IMPLANT
GOWN STRL REUS W/ TWL LRG LVL3 (GOWN DISPOSABLE) ×1 IMPLANT
GOWN STRL REUS W/ TWL XL LVL3 (GOWN DISPOSABLE) ×1 IMPLANT
GOWN STRL REUS W/TWL LRG LVL3 (GOWN DISPOSABLE) ×2
GOWN STRL REUS W/TWL XL LVL3 (GOWN DISPOSABLE) ×2
KIT BASIN OR (CUSTOM PROCEDURE TRAY) ×2 IMPLANT
KIT TURNOVER KIT B (KITS) ×2 IMPLANT
MANIFOLD NEPTUNE II (INSTRUMENTS) ×2 IMPLANT
NEEDLE 22X1 1/2 (OR ONLY) (NEEDLE) IMPLANT
NS IRRIG 1000ML POUR BTL (IV SOLUTION) ×2 IMPLANT
PACK ORTHO EXTREMITY (CUSTOM PROCEDURE TRAY) ×2 IMPLANT
PAD ARMBOARD 7.5X6 YLW CONV (MISCELLANEOUS) ×4 IMPLANT
PAD CAST 4YDX4 CTTN HI CHSV (CAST SUPPLIES) IMPLANT
PADDING CAST COTTON 4X4 STRL (CAST SUPPLIES)
SUT ETHILON 2 0 FS 18 (SUTURE) ×4 IMPLANT
SYR CONTROL 10ML LL (SYRINGE) IMPLANT
TOWEL GREEN STERILE (TOWEL DISPOSABLE) ×2 IMPLANT
TUBE CONNECTING 20X1/4 (TUBING) ×2 IMPLANT
YANKAUER SUCT BULB TIP NO VENT (SUCTIONS) ×2 IMPLANT

## 2020-10-25 NOTE — Anesthesia Preprocedure Evaluation (Addendum)
Anesthesia Evaluation  Patient identified by MRN, date of birth, ID band Patient awake    Reviewed: Allergy & Precautions, NPO status , Patient's Chart, lab work & pertinent test results  History of Anesthesia Complications Negative for: history of anesthetic complications  Airway Mallampati: II  TM Distance: >3 FB Neck ROM: Full    Dental  (+) Poor Dentition, Missing, Dental Advisory Given   Pulmonary neg pulmonary ROS,  10/23/2020 SARS coronavirus NEG   breath sounds clear to auscultation       Cardiovascular hypertension, Pt. on medications (-) angina+ Peripheral Vascular Disease   Rhythm:Regular Rate:Normal  '16 ECHO: EF 55-60%, no significant valvular abnormalities   Neuro/Psych Diabetic peripheral neuropathy    GI/Hepatic negative GI ROS, Neg liver ROS,   Endo/Other  diabetes (glu 269), Insulin DependentMorbid obesity  Renal/GU Renal InsufficiencyRenal disease (creat 1.84)     Musculoskeletal   Abdominal (+) + obese,   Peds  Hematology  (+) Blood dyscrasia (Hb 10.5), anemia ,   Anesthesia Other Findings   Reproductive/Obstetrics                           Anesthesia Physical Anesthesia Plan  ASA: III  Anesthesia Plan: MAC and Regional   Post-op Pain Management:    Induction:   PONV Risk Score and Plan: 1 and Ondansetron  Airway Management Planned: Natural Airway and Simple Face Mask  Additional Equipment: None  Intra-op Plan:   Post-operative Plan:   Informed Consent: I have reviewed the patients History and Physical, chart, labs and discussed the procedure including the risks, benefits and alternatives for the proposed anesthesia with the patient or authorized representative who has indicated his/her understanding and acceptance.     Dental advisory given  Plan Discussed with: CRNA and Surgeon  Anesthesia Plan Comments: (Plan routine monitors, ankle block with  sedation)       Anesthesia Quick Evaluation

## 2020-10-25 NOTE — Progress Notes (Signed)
   10/25/20 2034  Assess: MEWS Score  Temp (!) 102 F (38.9 C)  BP (!) 174/68  Pulse Rate (!) 106  Resp 18  Level of Consciousness Alert  SpO2 96 %  O2 Device Room Air  Patient Activity (if Appropriate) In bed  Assess: MEWS Score  MEWS Temp 2  MEWS Systolic 0  MEWS Pulse 1  MEWS RR 0  MEWS LOC 0  MEWS Score 3  MEWS Score Color Yellow  Assess: if the MEWS score is Yellow or Red  Were vital signs taken at a resting state? Yes  Focused Assessment No change from prior assessment  Early Detection of Sepsis Score *See Row Information* High  MEWS guidelines implemented *See Row Information* Yes  Treat  MEWS Interventions Administered scheduled meds/treatments;Administered prn meds/treatments;Escalated (See documentation below)  Pain Scale 0-10  Pain Score 0  Take Vital Signs  Increase Vital Sign Frequency  Yellow: Q 2hr X 2 then Q 4hr X 2, if remains yellow, continue Q 4hrs  Escalate  MEWS: Escalate Yellow: discuss with charge nurse/RN and consider discussing with provider and RRT  Notify: Charge Nurse/RN  Name of Charge Nurse/RN Notified Yrneh, RN  Date Charge Nurse/RN Notified 10/25/20  Time Charge Nurse/RN Notified 2047  Notify: Provider  Provider Name/Title Linton Flemings  Date Provider Notified 10/25/20  Time Provider Notified 2045  Notification Type Page  Notification Reason Other (Comment) (fever)  Provider response See new orders  Date of Provider Response 10/25/20  Time of Provider Response 2048  Notify: Rapid Response  Name of Rapid Response RN Notified N/A  Document  Patient Outcome Other (Comment) (Giving Nacl Bolus)  Progress note created (see row info) Yes  Patient is alert and oriented x4. Has a fever to 38.9, tachy to 107. Tylenol and scheduled antibiotics administered, nacl bolus administered per order. Patient in no acute distress. Will monitor vitals per yellow [place] policy.

## 2020-10-25 NOTE — Plan of Care (Signed)

## 2020-10-25 NOTE — Anesthesia Procedure Notes (Signed)
Anesthesia Regional Block: Ankle block   Pre-Anesthetic Checklist: ,, timeout performed, Correct Patient, Correct Site, Correct Laterality, Correct Procedure, Correct Position, site marked, Risks and benefits discussed,  Surgical consent,  Pre-op evaluation,  At surgeon's request and post-op pain management  Laterality: Left and Lower  Prep: chloraprep       Needles:  Injection technique: Single-shot  Needle Type: Quincke     Needle Length: 4cm  Needle Gauge: 25     Additional Needles:   (perineural infiltration)  Narrative:  Start time: 10/25/2020 8:43 AM End time: 10/25/2020 8:49 AM Injection made incrementally with aspirations every 5 mL.  Performed by: Personally  Anesthesiologist: Jairo Ben, MD  Additional Notes: Pt identified in Holding room.  Monitors applied. Working IV access confirmed. Sterile prep L ankle.  #[id number] Quincke perineural infiltration of local around deep and sup peroneal, saph, sural, post tib nerves at ankle.  Total 40cc 0.5% Bupivacaine injected incrementally.  Patient asymptomatic, VSS, no heme aspirated, tolerated well.  Sandford Craze, MD

## 2020-10-25 NOTE — Anesthesia Procedure Notes (Signed)
Procedure Name: MAC Date/Time: 10/25/2020 8:58 AM Performed by: Inda Coke, CRNA Pre-anesthesia Checklist: Patient identified, Emergency Drugs available, Suction available, Timeout performed and Patient being monitored Patient Re-evaluated:Patient Re-evaluated prior to induction Oxygen Delivery Method: Simple face mask Induction Type: IV induction Dental Injury: Teeth and Oropharynx as per pre-operative assessment

## 2020-10-25 NOTE — Progress Notes (Signed)
PROGRESS NOTE  KARRY CAUSER IPJ:[id number] DOB: 13-May-1951 DOA: 10/23/2020 PCP: [facility]  HPI/Recap of past 24 hours: Richard Russell is a 70 y.o. male with medical history significant of hypercholesterolemia, hypertension, type 2 diabetes mellitus with renal manifestations, diabetic peripheral neuropathy, class I obesity, history of back surgery with occasional back pain exacerbation who is sent from [facility] after presenting there with 1 month history of left great toe injury, which turned into an ulcer that he has been managing with unknown OTC spray and pressure stockings.  He recently reinjured the area developing more erythema, edema, calor and purulent discharge.  He does not complain of significant pain because decreased sensation secondary to diabetic peripheral neuropathy.      Work-up revealed left great toe osteomyelitis, diagnosed with MRI.  ESR 127 and CRP 34 also consistent with osteomyelitis.  He is currently on broad-spectrum IV antibiotics IV vancomycin, cefepime, p.o. Flagyl.  Will obtain blood cultures x2 peripherally in the setting of sepsis.  10/25/20: Patient was seen and examined at his bedside.  He denies having any pain at time of this exam.  He is POD #0 post left hallux partial amputation by orthopedic surgery Dr. Lucia Gaskins on 10/25/2020.    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 Lucia Gaskins on 10/25/2020. Presented with fever with T-max of 100.6, leukocytosis and evidence of left great toe osteomyelitis on MRI left foot. ESR 127, CRP 34 on 10/23/2020 He is currently on  broad-spectrum IV antibiotics IV vancomycin, cefepime, p.o. Flagyl, day #3.   Deep wound cultures in process, continue to follow.   Blood cultures negative to date, continue to follow cultures.    Improving hypovolemic hyponatremia Serum sodium 130 from 128. Continue gentle IV fluid hydration normal saline at 50 cc/h.  Refractory hypokalemia Serum potassium 3.3 Replete orally 40 mEq daily x3 days. Magnesium 1.9. Repeat BMP in the morning.  Type 2 diabetes with hyperglycemia Hemoglobin A1c 10.6 on 10/23/2020 CBGs have been elevated in the 300s Lantus dose increased to 35 units twice daily, NovoLog dose increased to 7 units before meals. Continue insulin sliding scale. Continue to monitor CBGs.  Essential hypertension Continue Norvasc 10 mg daily, clonidine patch 0.2 mg every 7 days. Continue to monitor vital signs.  Hyperlipidemia Continue home Lipitor.  Diabetes polyneuropathy Continue home gabapentin  Code Status: Full code.  Family Communication: None at bedside.  Disposition Plan: Likely will discharge to home once orthopedic surgery signs off possibly on 10/26/2020 or when deep wound culture results.   Consultants:  Orthopedic surgery.  Procedures:  None at this time.  Antimicrobials:  IV vancomycin, cefepime, p.o. Flagyl, day #3.  DVT prophylaxis: Subcu enoxaparin daily.  Status is: Inpatient    Dispo:  Patient From: Home  Planned Disposition: Home   Anticipated discharge date: 10/26/2020 or when orthopedic surgery signs off or when deep wound culture results.  Medically stable for discharge: No, ongoing management postop.         Objective: Vitals:   10/25/20 0950 10/25/20 1000 10/25/20 1005 10/25/20 1037  BP: (!) 154/76 (!) 157/77 (!) 153/73 (!) 159/80  Pulse: 80  81 78 80  Resp: 16 20 18 20   Temp:   97.9 F (36.6 C) 98 F (36.7 C)  TempSrc:    Oral  SpO2: 96% 96% 97% 97%  Weight:      Height:        Intake/Output Summary (Last 24  hours) at 10/25/2020 1613 Last data filed at 10/25/2020 1526 Gross per 24 hour  Intake 1100 ml  Output 625 ml  Net 475 ml   Filed Weights   10/23/20 0934 10/25/20 0820  Weight: 112 kg 112 kg    Exam:  . General: 70 y.o. year-old male well-developed well-nourished in no acute distress.  Alert oriented x3.   . Cardiovascular: Regular rate and rhythm no rubs or gallops.  Marland Kitchen Respiratory: Clear to auscultation no wheezes or rales.   . Abdomen: Soft nontender normal bowel sounds present.   . Musculoskeletal: Left great toe in surgical dressing.  No lower extremity edema bilaterally. . Skin: Left great toe and surgical dressing. Marland Kitchen Psychiatry: Mood is appropriate for condition and setting.  Data Reviewed: CBC: Recent Labs  Lab 10/23/20 1023 10/23/20 1950 10/24/20 0201 10/25/20 0115  WBC 15.9* 17.7* 14.1* 15.8*  NEUTROABS 12.3* 13.4*  --   --   HGB 11.7* 12.0* 11.3* 10.5*  HCT 32.9* 34.4* 30.0* 28.4*  MCV 82.9 83.3 80.9 81.4  PLT 347 348 337 335   Basic Metabolic Panel: Recent Labs  Lab 10/23/20 1023 10/23/20 1950 10/24/20 0201 10/25/20 0115  NA 131* 128* 128* 130*  K 3.4* 3.6 3.3* 3.3*  CL 92* 91* 91* 93*  CO2 26 22 26 25   GLUCOSE 280* 447* 378* 317*  BUN 23 23 21 19   CREATININE 2.09* 1.92* 1.82* 1.84*  CALCIUM 8.5* 8.2* 8.1* 8.0*  MG  --   --   --  1.9  PHOS  --   --   --  2.4*   GFR: Estimated Creatinine Clearance: 50.4 mL/min (A) (by C-G formula based on SCr of 1.84 mg/dL (H)). Liver Function Tests: Recent Labs  Lab 10/23/20 1023  AST 29  ALT 27  ALKPHOS 92  BILITOT 1.1  PROT 8.2*  ALBUMIN 3.0*   No results for input(s): LIPASE, AMYLASE in the last 168 hours. No results for input(s): AMMONIA in the last 168 hours. Coagulation Profile: No results for input(s): INR, PROTIME in the last 168 hours. Cardiac Enzymes: No results for input(s): CKTOTAL, CKMB, CKMBINDEX, TROPONINI in the last 168 hours. BNP (last 3 results) No results for input(s): PROBNP in the  last 8760 hours. HbA1C: Recent Labs    10/23/20 1950  HGBA1C 10.6*   CBG: Recent Labs  Lab 10/24/20 2005 10/25/20 0639 10/25/20 0807 10/25/20 0944 10/25/20 1128  GLUCAP 184* 269* 263* 250* 197*   Lipid Profile: No results for input(s): CHOL, HDL, LDLCALC, TRIG, CHOLHDL, LDLDIRECT in the last 72 hours. Thyroid Function Tests: No results for input(s): TSH, T4TOTAL, FREET4, T3FREE, THYROIDAB in the last 72 hours. Anemia Panel: No results for input(s): VITAMINB12, FOLATE, FERRITIN, TIBC, IRON, RETICCTPCT in the last 72 hours. Urine analysis:    Component Value Date/Time   COLORURINE YELLOW 10/23/2020 1935   APPEARANCEUR CLEAR 10/23/2020 1935   LABSPEC 1.017 10/23/2020 1935   PHURINE 5.0 10/23/2020 1935   GLUCOSEU >=500 (A) 10/23/2020 1935   HGBUR SMALL (A) 10/23/2020 1935   BILIRUBINUR NEGATIVE 10/23/2020 1935   KETONESUR 5 (A) 10/23/2020 1935   PROTEINUR 30 (A) 10/23/2020 1935   UROBILINOGEN 0.2 02/24/2015 1600  NITRITE NEGATIVE 10/23/2020 1935   LEUKOCYTESUR NEGATIVE 10/23/2020 1935   Sepsis Labs: @LABRCNTIP (procalcitonin:4,lacticidven:4)  ) Recent Results (from the past 240 hour(s))  SARS CORONAVIRUS 2 (TAT 6-24 HRS) Nasopharyngeal Nasopharyngeal Swab     Status: None   Collection Time: 10/23/20 11:50 AM   Specimen: Nasopharyngeal Swab  Result Value Ref Range Status   SARS Coronavirus 2 NEGATIVE NEGATIVE Final    Comment: (NOTE) SARS-CoV-2 target nucleic acids are NOT DETECTED.  The SARS-CoV-2 RNA is generally detectable in upper and lower respiratory specimens during the acute phase of infection. Negative results do not preclude SARS-CoV-2 infection, do not rule out co-infections with other pathogens, and should not be used as the sole basis for treatment or other patient management decisions. Negative results must be combined with clinical observations, patient history, and epidemiological information. The expected result is Negative.  Fact Sheet for  Patients: SugarRoll.be  Fact Sheet for Healthcare Providers: https://www.woods-mathews.com/  This test is not yet approved or cleared by the [place] FDA and  has been authorized for detection and/or diagnosis of SARS-CoV-2 by FDA under an Emergency Use Authorization (EUA). This EUA will remain  in effect (meaning this test can be used) for the duration of the COVID-19 declaration under Se ction 564(b)(1) of the Act, 21 U.S.C. section 360bbb-3(b)(1), unless the authorization is terminated or revoked sooner.  Performed at [facility], [place] [address] [place], [place] [address]   Culture, blood (routine x 2)     Status: None (Preliminary result)   Collection Time: 10/24/20 12:51 PM   Specimen: BLOOD  Result Value Ref Range Status   Specimen Description BLOOD RIGHT ANTECUBITAL  Final   Special Requests   Final    BOTTLES DRAWN AEROBIC AND ANAEROBIC Blood Culture adequate volume   Culture   Final    NO GROWTH < 24 HOURS Performed at [facility], [place] [address] [place], [place] [address]    Report Status PENDING  Incomplete  Culture, blood (routine x 2)     Status: None (Preliminary result)   Collection Time: 10/24/20 12:51 PM   Specimen: BLOOD LEFT HAND  Result Value Ref Range Status   Specimen Description BLOOD LEFT HAND  Final   Special Requests   Final    BOTTLES DRAWN AEROBIC AND ANAEROBIC Blood Culture adequate volume   Culture   Final    NO GROWTH < 24 HOURS Performed at [facility], [place] [address], [place], [place] [address]    Report Status PENDING  Incomplete      Studies: No results found.  Scheduled Meds: . (feeding supplement) PROSource Plus  30 mL Oral Daily  . amLODipine  10 mg Oral Daily  . atorvastatin  40 mg Oral Daily  . chlorhexidine  15 mL Mouth/Throat NOW  . chlorhexidine      . cloNIDine  0.2 mg Transdermal Weekly  . enoxaparin (LOVENOX) injection  55 mg Subcutaneous QHS  .  gabapentin  800 mg Oral TID  . insulin aspart  0-15 Units Subcutaneous TID WC  . insulin aspart  5 Units Subcutaneous TID WC  . insulin detemir  35 Units Subcutaneous BID  . methocarbamol  500 mg Oral BID  . metroNIDAZOLE  500 mg Oral Q8H  . multivitamin with minerals  1 tablet Oral Daily  . Ensure Max Protein  11 oz Oral BID    Continuous Infusions: . sodium chloride 50 mL/hr at 10/24/20 1304  . ceFAZolin    .  ceFEPime (MAXIPIME) IV 2 g (10/25/20 1047)  . lactated ringers    . vancomycin 1,250 mg (10/25/20 1525)     LOS: 2 days     Kayleen Memos, MD [facility] Pager [phone]  If 7PM-7AM, please contact night-coverage [email or web address] Password [facility] 10/25/2020, 4:13 PM

## 2020-10-25 NOTE — Brief Op Note (Signed)
10/25/2020  11:44 AM  PATIENT:  Richard Russell  70 y.o. male  PRE-OPERATIVE DIAGNOSIS:  INFECTED HALLUX  POST-OPERATIVE DIAGNOSIS:  INFECTED HALLUX  PROCEDURE:  Procedure(s): Left Hallux Partial Amputation (Left)  Irrigation and exsisional debridement left forefoot abscess  SURGEON:  Surgeon(s) and Role:    Erle Crocker, MD - Primary  PHYSICIAN ASSISTANT:   ASSISTANTS: none   ANESTHESIA:   MAC  EBL:  25 mL   BLOOD ADMINISTERED:none  DRAINS: none   LOCAL MEDICATIONS USED:  NONE  SPECIMEN:  Source of Specimen:  Left hallux  DISPOSITION OF SPECIMEN:  Micro and pathology  COUNTS:  YES  TOURNIQUET:  * No tourniquets in log *  DICTATION: .Dragon Dictation  PLAN OF CARE: Admit to inpatient   PATIENT DISPOSITION:  PACU - hemodynamically stable.   Delay start of Pharmacological VTE agent (>24hrs) due to surgical blood loss or risk of bleeding: no

## 2020-10-25 NOTE — Transfer of Care (Signed)
Immediate Anesthesia Transfer of Care Note  Patient: Koren E Rothermel  Procedure(s) Performed: Left Hallux Partial Amputation (Left Toe)  Patient Location: PACU  Anesthesia Type:MAC combined with regional for post-op pain  Level of Consciousness: awake and alert   Airway & Oxygen Therapy: Patient Spontanous Breathing  Post-op Assessment: Report given to RN and Post -op Vital signs reviewed and stable  Post vital signs: Reviewed and stable  Last Vitals:  Vitals Value Taken Time  BP    Temp    Pulse 85 10/25/20 0942  Resp    SpO2 96 % 10/25/20 0942  Vitals shown include unvalidated device data.  Last Pain:  Vitals:   10/25/20 0759  TempSrc: Oral  PainSc: 0-No pain         Complications: No complications documented.

## 2020-10-25 NOTE — Consult Note (Signed)
Reason for Consult: Left hallux distal phalanx osteomyelitis Referring Physician: Dr. Elby Showers Richard Russell is an 70 y.o. male.  HPI: Patient with uncontrolled diabetes and hemoglobin A1c of greater than 10 presented with several week history of left great toe pain, swelling and drainage.  X-rays revealed some bony destruction.  MRI scan ordered by the hospitalist team demonstrated distal phalanx osteomyelitis with bony destruction.  ABIs bilaterally were within normal limits.  Orthopedics was consulted.  Patient has some pain in his hallux.  He notes decreased sensibility is about his bilateral feet.  Denies fevers or chills.  Past Medical History:  Diagnosis Date  . Hypercholesteremia   . Hypertension   . Type II diabetes mellitus with renal manifestations [facility])     Past Surgical History:  Procedure Laterality Date  . BACK SURGERY    . LAPAROSCOPIC APPENDECTOMY N/A 02/24/2015   Procedure: APPENDECTOMY LAPAROSCOPIC;  Surgeon: Avel Peace, MD;  Location: [facility];  Service: General;  Laterality: N/A;    Family History  Problem Relation Age of Onset  . Diabetes Mother   . Heart attack Father     Social History:  reports that he has never smoked. He has never used smokeless tobacco. He reports that he does not drink alcohol and does not use drugs.  Allergies: No Known Allergies  Medications: I have reviewed the patient's current medications.  Results for orders placed or performed during the hospital encounter of 10/23/20 (from the past 48 hour(s))  CBG monitoring, ED     Status: Abnormal   Collection Time: 10/23/20  9:31 AM  Result Value Ref Range   Glucose-Capillary 250 (H) 70 - 99 mg/dL    Comment: Glucose reference range applies only to samples taken after fasting for at least 8 hours.  CBC with Differential/Platelet     Status: Abnormal   Collection Time: 10/23/20 10:23 AM  Result Value Ref Range   WBC 15.9 (H) 4.0 - 10.5 K/uL   RBC 3.97 (L) 4.22 - 5.81 MIL/uL    Hemoglobin 11.7 (L) 13.0 - 17.0 g/dL   HCT 70.6 (L) 23.7 - 62.8 %   MCV 82.9 80.0 - 100.0 fL   MCH 29.5 26.0 - 34.0 pg   MCHC 35.6 30.0 - 36.0 g/dL   RDW 31.5 17.6 - 16.0 %   Platelets 347 150 - 400 K/uL   nRBC 0.0 0.0 - 0.2 %   Neutrophils Relative % 78 %   Neutro Abs 12.3 (H) 1.7 - 7.7 K/uL   Lymphocytes Relative 12 %   Lymphs Abs 1.9 0.7 - 4.0 K/uL   Monocytes Relative 10 %   Monocytes Absolute 1.6 (H) 0.1 - 1.0 K/uL   Eosinophils Relative 0 %   Eosinophils Absolute 0.0 0.0 - 0.5 K/uL   Basophils Relative 0 %   Basophils Absolute 0.0 0.0 - 0.1 K/uL   Immature Granulocytes 0 %   Abs Immature Granulocytes 0.07 0.00 - 0.07 K/uL    Comment: Performed at [facility], [address] [place], [place] [address]  Comprehensive metabolic panel     Status: Abnormal   Collection Time: 10/23/20 10:23 AM  Result Value Ref Range   Sodium 131 (L) 135 - 145 mmol/L   Potassium 3.4 (L) 3.5 - 5.1 mmol/L   Chloride 92 (L) 98 - 111 mmol/L   CO2 26 22 - 32 mmol/L   Glucose, Bld 280 (H) 70 - 99 mg/dL    Comment: Glucose reference range applies  only to samples taken after fasting for at least 8 hours.   BUN 23 8 - 23 mg/dL   Creatinine, Ser 4.09 (H) 0.61 - 1.24 mg/dL   Calcium 8.5 (L) 8.9 - 10.3 mg/dL   Total Protein 8.2 (H) 6.5 - 8.1 g/dL   Albumin 3.0 (L) 3.5 - 5.0 g/dL   AST 29 15 - 41 U/L   ALT 27 0 - 44 U/L   Alkaline Phosphatase 92 38 - 126 U/L   Total Bilirubin 1.1 0.3 - 1.2 mg/dL   GFR, Estimated 34 (L) >60 mL/min    Comment: (NOTE) Calculated using the CKD-EPI Creatinine Equation (2021)    Anion gap 13 5 - 15    Comment: Performed at [facility], [address] [place], [place] [address]  Lactic acid, plasma     Status: None   Collection Time: 10/23/20 10:32 AM  Result Value Ref Range   Lactic Acid, Venous 1.4 0.5 - 1.9 mmol/L    Comment: Performed at [facility], [address] [place], [place] [address]  SARS CORONAVIRUS 2 (TAT 6-24 HRS)  Nasopharyngeal Nasopharyngeal Swab     Status: None   Collection Time: 10/23/20 11:50 AM   Specimen: Nasopharyngeal Swab  Result Value Ref Range   SARS Coronavirus 2 NEGATIVE NEGATIVE    Comment: (NOTE) SARS-CoV-2 target nucleic acids are NOT DETECTED.  The SARS-CoV-2 RNA is generally detectable in upper and lower respiratory specimens during the acute phase of infection. Negative results do not preclude SARS-CoV-2 infection, do not rule out co-infections with other pathogens, and should not be used as the sole basis for treatment or other patient management decisions. Negative results must be combined with clinical observations, patient history, and epidemiological information. The expected result is Negative.  Fact Sheet for Patients: HairSlick.no  Fact Sheet for Healthcare Providers: quierodirigir.com  This test is not yet approved or cleared by the [place] FDA and  has been authorized for detection and/or diagnosis of SARS-CoV-2 by FDA under an Emergency Use Authorization (EUA). This EUA will remain  in effect (meaning this test can be used) for the duration of the COVID-19 declaration under Se ction 564(b)(1) of the Act, 21 U.S.C. section 360bbb-3(b)(1), unless the authorization is terminated or revoked sooner.  Performed at [facility], [address], [place], [place] [address]   Urinalysis, Routine w reflex microscopic     Status: Abnormal   Collection Time: 10/23/20  7:35 PM  Result Value Ref Range   Color, Urine YELLOW YELLOW   APPearance CLEAR CLEAR   Specific Gravity, Urine 1.017 1.005 - 1.030   pH 5.0 5.0 - 8.0   Glucose, UA >=500 (A) NEGATIVE mg/dL   Hgb urine dipstick SMALL (A) NEGATIVE   Bilirubin Urine NEGATIVE NEGATIVE   Ketones, ur 5 (A) NEGATIVE mg/dL   Protein, ur 30 (A) NEGATIVE mg/dL   Nitrite NEGATIVE NEGATIVE   Leukocytes,Ua NEGATIVE NEGATIVE   RBC / HPF 0-5 0 - 5 RBC/hpf   WBC,  UA 0-5 0 - 5 WBC/hpf   Bacteria, UA NONE SEEN NONE SEEN   Squamous Epithelial / LPF 0-5 0 - 5   Mucus PRESENT     Comment: Performed at [facility], [address], [place], [place] [address]  Basic metabolic panel     Status: Abnormal   Collection Time: 10/23/20  7:50 PM  Result Value Ref Range   Sodium 128 (L) 135 - 145  mmol/L   Potassium 3.6 3.5 - 5.1 mmol/L   Chloride 91 (L) 98 - 111 mmol/L   CO2 22 22 - 32 mmol/L   Glucose, Bld 447 (H) 70 - 99 mg/dL    Comment: Glucose reference range applies only to samples taken after fasting for at least 8 hours.   BUN 23 8 - 23 mg/dL   Creatinine, Ser 1.61 (H) 0.61 - 1.24 mg/dL   Calcium 8.2 (L) 8.9 - 10.3 mg/dL   GFR, Estimated 38 (L) >60 mL/min    Comment: (NOTE) Calculated using the CKD-EPI Creatinine Equation (2021)    Anion gap 15 5 - 15    Comment: Performed at [facility], [address] [place], [place] [address]  CBC with Differential     Status: Abnormal   Collection Time: 10/23/20  7:50 PM  Result Value Ref Range   WBC 17.7 (H) 4.0 - 10.5 K/uL   RBC 4.13 (L) 4.22 - 5.81 MIL/uL   Hemoglobin 12.0 (L) 13.0 - 17.0 g/dL   HCT 54.0 (L) 98.1 - 19.1 %   MCV 83.3 80.0 - 100.0 fL   MCH 29.1 26.0 - 34.0 pg   MCHC 34.9 30.0 - 36.0 g/dL   RDW 47.8 29.5 - 62.1 %   Platelets 348 150 - 400 K/uL   nRBC 0.0 0.0 - 0.2 %   Neutrophils Relative % 76 %   Neutro Abs 13.4 (H) 1.7 - 7.7 K/uL   Lymphocytes Relative 15 %   Lymphs Abs 2.7 0.7 - 4.0 K/uL   Monocytes Relative 8 %   Monocytes Absolute 1.4 (H) 0.1 - 1.0 K/uL   Eosinophils Relative 0 %   Eosinophils Absolute 0.0 0.0 - 0.5 K/uL   Basophils Relative 0 %   Basophils Absolute 0.0 0.0 - 0.1 K/uL   Immature Granulocytes 1 %   Abs Immature Granulocytes 0.11 (H) 0.00 - 0.07 K/uL    Comment: Performed at [facility], [address], [place], [place] [address]  Hemoglobin A1c     Status: Abnormal   Collection Time: 10/23/20  7:50 PM  Result Value Ref Range   Hgb A1c  MFr Bld 10.6 (H) 4.8 - 5.6 %    Comment: (NOTE) Pre diabetes:          5.7%-6.4%  Diabetes:              >6.4%  Glycemic control for   <7.0% adults with diabetes    Mean Plasma Glucose 257.52 mg/dL    Comment: Performed at [facility], [address] [place], [place] [address]  HIV Antibody (routine testing w rflx)     Status: None   Collection Time: 10/23/20  7:50 PM  Result Value Ref Range   HIV Screen 4th Generation wRfx Non Reactive Non Reactive    Comment: Performed at [facility], [address] [place], [place] [address]  Sedimentation rate     Status: Abnormal   Collection Time: 10/23/20  7:50 PM  Result Value Ref Range   Sed Rate 127 (H) 0 - 16 mm/hr    Comment: Performed at [facility], [address] [place], [place] [address]  C-reactive protein     Status: Abnormal   Collection Time: 10/23/20  7:50 PM  Result Value Ref Range   CRP 34.6 (H) <1.0 mg/dL    Comment: Performed at [facility], [address], [place], [place] [address]  Prealbumin     Status: Abnormal   Collection Time: 10/23/20  7:50 PM  Result Value Ref Range   Prealbumin 6.5 (L) 18 - 38 mg/dL    Comment: Performed at [facility], [address], [place], [place] [address]  Glucose, capillary     Status: Abnormal   Collection Time: 10/23/20  9:47 PM  Result Value Ref Range   Glucose-Capillary 429 (H) 70 - 99 mg/dL    Comment: Glucose reference range applies only to samples taken after fasting for at least 8 hours.  CBC     Status: Abnormal   Collection Time: 10/24/20  2:01 AM  Result Value Ref Range   WBC 14.1 (H) 4.0 - 10.5 K/uL   RBC 3.71 (L) 4.22 - 5.81 MIL/uL   Hemoglobin 11.3 (L) 13.0 - 17.0 g/dL   HCT 60.4 (L) 54.0 - 98.1 %   MCV 80.9 80.0 - 100.0 fL   MCH 30.5 26.0 - 34.0 pg   MCHC 37.7 (H) 30.0 - 36.0 g/dL   RDW 19.1 47.8 - 29.5 %   Platelets 337 150 - 400 K/uL   nRBC 0.0 0.0 - 0.2 %    Comment: Performed at [facility], [address] [place], [place] [address]  Basic metabolic panel     Status: Abnormal   Collection Time: 10/24/20  2:01 AM  Result Value Ref Range   Sodium 128 (L) 135 - 145 mmol/L   Potassium 3.3 (L) 3.5 - 5.1 mmol/L   Chloride 91 (L) 98 - 111 mmol/L   CO2 26 22 - 32 mmol/L   Glucose, Bld 378 (H) 70 - 99 mg/dL    Comment: Glucose reference range applies only to samples taken after fasting for at least 8 hours.   BUN 21 8 - 23 mg/dL   Creatinine, Ser 8.65 (H) 0.61 - 1.24 mg/dL   Calcium 8.1 (L) 8.9 - 10.3 mg/dL   GFR, Estimated 40 (L) >60 mL/min    Comment: (NOTE) Calculated using the CKD-EPI Creatinine Equation (2021)    Anion gap 11 5 - 15    Comment: Performed at [facility], [address] [place], [place] [address]  Glucose, capillary     Status: Abnormal   Collection Time: 10/24/20  7:25 AM  Result Value Ref Range   Glucose-Capillary 269 (H) 70 - 99 mg/dL    Comment: Glucose reference range applies only to samples taken after fasting for at least 8 hours.  Glucose, capillary     Status: Abnormal   Collection Time: 10/24/20 12:51 PM  Result Value Ref Range   Glucose-Capillary 283 (H) 70 - 99 mg/dL    Comment: Glucose reference range applies only to samples taken after fasting for at least 8 hours.  Glucose, capillary     Status: Abnormal   Collection Time: 10/24/20  5:36 PM  Result Value Ref Range   Glucose-Capillary 221 (H) 70 - 99 mg/dL    Comment: Glucose reference range applies only to samples taken after fasting for at least 8 hours.  Glucose, capillary     Status: Abnormal   Collection Time: 10/24/20  8:05 PM  Result Value Ref Range   Glucose-Capillary 184 (H) 70 - 99 mg/dL    Comment: Glucose reference range applies only to samples taken after fasting for at least 8 hours.  CBC     Status: Abnormal   Collection Time: 10/25/20  1:15 AM  Result Value Ref Range  WBC 15.8 (H) 4.0 - 10.5 K/uL   RBC 3.49 (L) 4.22 - 5.81 MIL/uL   Hemoglobin 10.5 (L) 13.0 - 17.0 g/dL   HCT  16.128.4 (L) 09.639.0 - 52.0 %   MCV 81.4 80.0 - 100.0 fL   MCH 30.1 26.0 - 34.0 pg   MCHC 37.0 (H) 30.0 - 36.0 g/dL   RDW 04.511.6 40.911.5 - 81.115.5 %   Platelets 366 150 - 400 K/uL   nRBC 0.0 0.0 - 0.2 %    Comment: Performed at [facility], [address] [place], [place] [address]  Basic metabolic panel     Status: Abnormal   Collection Time: 10/25/20  1:15 AM  Result Value Ref Range   Sodium 130 (L) 135 - 145 mmol/L   Potassium 3.3 (L) 3.5 - 5.1 mmol/L   Chloride 93 (L) 98 - 111 mmol/L   CO2 25 22 - 32 mmol/L   Glucose, Bld 317 (H) 70 - 99 mg/dL    Comment: Glucose reference range applies only to samples taken after fasting for at least 8 hours.   BUN 19 8 - 23 mg/dL   Creatinine, Ser 2.951.84 (H) 0.61 - 1.24 mg/dL   Calcium 8.0 (L) 8.9 - 10.3 mg/dL   GFR, Estimated 40 (L) >60 mL/min    Comment: (NOTE) Calculated using the CKD-EPI Creatinine Equation (2021)    Anion gap 12 5 - 15    Comment: Performed at [facility], [address] [place], [place] [address]  Magnesium     Status: None   Collection Time: 10/25/20  1:15 AM  Result Value Ref Range   Magnesium 1.9 1.7 - 2.4 mg/dL    Comment: Performed at [facility], [address] [place], [place] [address]  Phosphorus     Status: Abnormal   Collection Time: 10/25/20  1:15 AM  Result Value Ref Range   Phosphorus 2.4 (L) 2.5 - 4.6 mg/dL    Comment: Performed at [facility], [address] [place], [place] [address]  Glucose, capillary     Status: Abnormal   Collection Time: 10/25/20  6:39 AM  Result Value Ref Range   Glucose-Capillary 269 (H) 70 - 99 mg/dL    Comment: Glucose reference range applies only to samples taken after fasting for at least 8 hours.  Glucose, capillary     Status: Abnormal   Collection Time: 10/25/20  8:07 AM  Result Value Ref Range   Glucose-Capillary 263 (H) 70 - 99 mg/dL    Comment: Glucose reference range applies only to samples taken after fasting for at least 8 hours.    MR  FOOT LEFT WO CONTRAST  Result Date: 10/24/2020 CLINICAL DATA:  Diabetic patient with a skin ulceration on the left great toe EXAM: MRI OF THE LEFT FOOT WITHOUT CONTRAST TECHNIQUE: Multiplanar, multisequence MR imaging of the left foot was performed. No intravenous contrast was administered. COMPARISON:  Plain films left great toe 10/23/2020. FINDINGS: Bones/Joint/Cartilage Marrow edema is seen throughout the distal and phalanx of the great toe and the tuft of the distal phalanx appears mildly fragmented. No other evidence of osteomyelitis is identified. The patient has degenerative change at the first MTP joint with secondary marrow edema in the tibial sesamoid and overlying head of the first metatarsal. No fracture stress change is identified. No joint effusion. Ligaments Intact. Muscles and Tendons No intramuscular fluid collection or mass. There is some fatty atrophy of intrinsic musculature of the  foot. Soft tissues No abscess is identified. Skin ulceration and soft tissue swelling great toe noted. IMPRESSION: Findings consistent with osteomyelitis throughout the distal phalanx of the great toe. Negative for septic joint or myositis. First MTP osteoarthritis. Electronically Signed   By: Drusilla Kanner M.D.   On: 10/24/2020 09:34   DG Toe Great Left  Result Date: 10/23/2020 CLINICAL DATA:  Pain, swelling left great toe EXAM: LEFT GREAT TOE COMPARISON:  None. FINDINGS: Lucency in the tip of the left great toe distal phalanx concerning for osteomyelitis. No fracture, subluxation or dislocation. IMPRESSION: Lucency in the left great toe distal phalanx tip concerning for osteomyelitis. Electronically Signed   By: Charlett Nose M.D.   On: 10/23/2020 10:52   VAS [place] ABI WITH/WO TBI  Result Date: 10/24/2020 LOWER EXTREMITY DOPPLER STUDY Indications: Ulceration. High Risk Factors: Hypertension, hyperlipidemia, Diabetes.  Limitations: Today's exam was limited due to rapid heart rate and edema in               ankles. Comparison Study: No prior study Performing Technologist: Sherren Kerns RVS  Examination Guidelines: A complete evaluation includes at minimum, Doppler waveform signals and systolic blood pressure reading at the level of bilateral brachial, anterior tibial, and posterior tibial arteries, when vessel segments are accessible. Bilateral testing is considered an integral part of a complete examination. Photoelectric Plethysmograph (PPG) waveforms and toe systolic pressure readings are included as required and additional duplex testing as needed. Limited examinations for reoccurring indications may be performed as noted.  ABI Findings: +---------+------------------+-----+-----------+--------+ Right    Rt Pressure (mmHg)IndexWaveform   Comment  +---------+------------------+-----+-----------+--------+ Brachial 178                    multiphasic         +---------+------------------+-----+-----------+--------+ PTA      189               1.06 multiphasic         +---------+------------------+-----+-----------+--------+ DP       141               0.79 multiphasic         +---------+------------------+-----+-----------+--------+ Great Toe129               0.72                     +---------+------------------+-----+-----------+--------+ +---------+------------------+-----+-----------+-------------------------------+ Left     Lt Pressure (mmHg)IndexWaveform   Comment                         +---------+------------------+-----+-----------+-------------------------------+ Brachial 177                    multiphasic                                +---------+------------------+-----+-----------+-------------------------------+ PTA      181               1.02 multiphasic                                +---------+------------------+-----+-----------+-------------------------------+ DP       180               1.01 multiphasic                                 +---------+------------------+-----+-----------+-------------------------------+  Great Toe119               0.67            2nd toe pressure, great toe                                                with ulcer                      +---------+------------------+-----+-----------+-------------------------------+ +-------+-----------+-----------+------------+------------+ ABI/TBIToday's ABIToday's TBIPrevious ABIPrevious TBI +-------+-----------+-----------+------------+------------+ Right  1.06       0.72                                +-------+-----------+-----------+------------+------------+ Left   1.02       0.67                                +-------+-----------+-----------+------------+------------+  Summary: Right: Resting right ankle-brachial index is within normal range. No evidence of significant right lower extremity arterial disease. The right toe-brachial index is normal. Left: Resting left ankle-brachial index is within normal range. No evidence of significant left lower extremity arterial disease. The left toe-brachial index is normal. TBI done on 2nd toe secondary to great toe ulceration.  *See table(s) above for measurements and observations.  Electronically signed by Heath Lark on 10/24/2020 at 5:51:14 PM.    Final     Review of Systems  Constitutional: Negative.   HENT: Negative.   Eyes: Negative.   Respiratory: Negative.   Cardiovascular: Negative.   Gastrointestinal: Negative.   Musculoskeletal:       Left great toe pain and swelling  Neurological: Positive for numbness.  Psychiatric/Behavioral: Negative.    Blood pressure (!) 174/87, pulse 94, temperature (!) 100.5 F (38.1 C), temperature source Oral, resp. rate 15, height 6' (1.829 m), weight 112 kg, SpO2 98 %. Physical Exam Vitals reviewed.  HENT:     Head: Normocephalic.     Mouth/Throat:     Mouth: Mucous membranes are moist.  Eyes:     Extraocular Movements: Extraocular movements  intact.  Pulmonary:     Effort: Pulmonary effort is normal.  Abdominal:     Comments: obese  Musculoskeletal:     Cervical back: Neck supple.     Comments: Left hallux tip ulceration wound with swelling.  Drainage and foul odor present. TTP.  No pain with ROM 1st MTP  Endorses SILT to plantar foot.   Skin:    General: Skin is warm.  Neurological:     General: No focal deficit present.     Mental Status: He is alert.  Psychiatric:        Mood and Affect: Mood normal.     Assessment/Plan: Left hallux distal phalanx osteomyelitis in the setting of uncontrolled diabetes  We had a lengthy discussion.  Patient is indicated for partial or total hallux amputation given the MRI findings of distal phalanx osteomyelitis and overlying cellulitis.  We will plan for that this morning.  Patient is n.p.o.  Terance Hart 10/25/2020, 8:45 AM

## 2020-10-25 NOTE — Anesthesia Postprocedure Evaluation (Signed)
Anesthesia Post Note  Patient: Richard Russell  Procedure(s) Performed: Left Hallux Partial Amputation (Left Toe)     Patient location during evaluation: PACU Anesthesia Type: Regional Level of consciousness: awake and alert, patient cooperative and oriented Pain management: pain level controlled Vital Signs Assessment: post-procedure vital signs reviewed and stable Respiratory status: spontaneous breathing, nonlabored ventilation, respiratory function stable and patient connected to nasal cannula oxygen Cardiovascular status: blood pressure returned to baseline and stable Postop Assessment: no apparent nausea or vomiting Anesthetic complications: no   No complications documented.  Last Vitals:  Vitals:   10/25/20 0950 10/25/20 1000  BP: (!) 154/76 (!) 157/77  Pulse: 80 81  Resp: 16 20  Temp:    SpO2: 96% 96%    Last Pain:  Vitals:   10/25/20 1000  TempSrc:   PainSc: 0-No pain                 JACKSON,E. CARSWELL

## 2020-10-25 NOTE — Consult Note (Addendum)
WOC Nurse Consult Note: Reason for Consult:WOC nurse consulted for neuropathic ulceration at toe. Findings consistent with osteomyelitis.  Secure chat with Dr. Delma Post recommending consultation with Podiatric Medicine or Orthopedics. Wound type: Neuropathic, infectious Pressure Injury POA: N/A Measurement: 2cm, x 3cm black adherent eschar Wound bed:As noted above Drainage (amount, consistency, odor) None Periwound: erythematous Dressing procedure:Patient scheduled for operative procedure tomorrow with orthopedics.  WOC nursing team will not follow, but will remain available to this patient, the nursing and medical teams.  Please re-consult if needed. Thanks, Ladona Mow, MSN, RN, GNP, [place]  Pager# [phone]

## 2020-10-26 ENCOUNTER — Encounter (HOSPITAL_COMMUNITY): Payer: Self-pay | Admitting: Orthopaedic Surgery

## 2020-10-26 DIAGNOSIS — M869 Osteomyelitis, unspecified: Secondary | ICD-10-CM | POA: Diagnosis not present

## 2020-10-26 LAB — CBC
HCT: 28.9 % — ABNORMAL LOW (ref 39.0–52.0)
Hemoglobin: 10.6 g/dL — ABNORMAL LOW (ref 13.0–17.0)
MCH: 30.1 pg (ref 26.0–34.0)
MCHC: 36.7 g/dL — ABNORMAL HIGH (ref 30.0–36.0)
MCV: 82.1 fL (ref 80.0–100.0)
Platelets: 384 10*3/uL (ref 150–400)
RBC: 3.52 MIL/uL — ABNORMAL LOW (ref 4.22–5.81)
RDW: 11.8 % (ref 11.5–15.5)
WBC: 15.1 10*3/uL — ABNORMAL HIGH (ref 4.0–10.5)
nRBC: 0 % (ref 0.0–0.2)

## 2020-10-26 LAB — GLUCOSE, CAPILLARY
Glucose-Capillary: 218 mg/dL — ABNORMAL HIGH (ref 70–99)
Glucose-Capillary: 339 mg/dL — ABNORMAL HIGH (ref 70–99)
Glucose-Capillary: 246 mg/dL — ABNORMAL HIGH (ref 70–99)
Glucose-Capillary: 217 mg/dL — ABNORMAL HIGH (ref 70–99)

## 2020-10-26 LAB — BASIC METABOLIC PANEL
Anion gap: 10 (ref 5–15)
BUN: 18 mg/dL (ref 8–23)
CO2: 24 mmol/L (ref 22–32)
Calcium: 8.1 mg/dL — ABNORMAL LOW (ref 8.9–10.3)
Chloride: 99 mmol/L (ref 98–111)
Creatinine, Ser: 1.82 mg/dL — ABNORMAL HIGH (ref 0.61–1.24)
GFR, Estimated: 40 mL/min — ABNORMAL LOW (ref >60)
Glucose, Bld: 250 mg/dL — ABNORMAL HIGH (ref 70–99)
Potassium: 3.9 mmol/L (ref 3.5–5.1)
Sodium: 133 mmol/L — ABNORMAL LOW (ref 135–145)

## 2020-10-26 MED ORDER — VANCOMYCIN HCL 1000 MG/200ML IV SOLN
1000.0000 mg | INTRAVENOUS | Status: DC
  Administered 2020-10-26: 1000 mg via INTRAVENOUS
  Filled 2020-10-26 (×2): qty 200

## 2020-10-26 MED ORDER — INSULIN DETEMIR 100 UNIT/ML ~~LOC~~ SOLN
37.0000 [IU] | Freq: Two times a day (BID) | SUBCUTANEOUS | Status: DC
  Administered 2020-10-26 – 2020-10-28 (×4): 37 [IU] via SUBCUTANEOUS
  Filled 2020-10-26 (×5): qty 0.37

## 2020-10-26 MED ORDER — HYDRALAZINE HCL 20 MG/ML IJ SOLN
5.0000 mg | Freq: Four times a day (QID) | INTRAMUSCULAR | Status: DC | PRN
  Administered 2020-10-26 – 2020-10-29 (×3): 5 mg via INTRAVENOUS
  Filled 2020-10-26 (×3): qty 1

## 2020-10-26 NOTE — Progress Notes (Signed)
PROGRESS NOTE  Richard Russell VWP:[id number] DOB: 10-06-1950 DOA: 10/23/2020 PCP: [facility]  HPI/Recap of past 24 hours: Richard Russell is a 70 y.o. male with medical history significant of hypercholesterolemia, hypertension, type 2 diabetes mellitus with renal manifestations, diabetic peripheral neuropathy, class I obesity, history of back surgery with occasional back pain exacerbation who is sent from [facility] after presenting there with 1 month history of left great toe injury, which turned into an ulcer that he has been managing with unknown OTC spray and pressure stockings.  He recently reinjured the area developing more erythema, edema, calor and purulent discharge.  He does not complain of significant pain because decreased sensation secondary to diabetic peripheral neuropathy.      Work-up revealed left great toe osteomyelitis, diagnosed with MRI.  ESR 127 and CRP 34 also consistent with osteomyelitis.  He is currently on broad-spectrum IV antibiotics IV vancomycin, cefepime, p.o. Flagyl.  Will obtain blood cultures x2 peripherally in the setting of sepsis.  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 Lucia Gaskins on 10/25/2020.  10/26/20: Patient was seen and examined at bedside this morning.  He was febrile overnight with T-max of 102 with worsening leukocytosis.  He is currently on IV antibiotics empirically.  Deep wound culture is growing Proteus mirabilis.  Blood cultures negative to date.    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 Lucia Gaskins on 10/25/2020. Presented with fever  with T-max of 100.6, leukocytosis and evidence of left great toe osteomyelitis on MRI left foot. ESR 127, CRP 34 on 10/23/2020 He is currently on broad-spectrum IV antibiotics IV vancomycin, cefepime, p.o. Flagyl, day #3.   Deep wound cultures in process, growing Proteus mirabilis, awaiting sensitivities. Blood cultures negative to date, continue to follow cultures.   Febrile overnight with T-max of 102. Repeat CRP and ESR in the morning.  Improving hypovolemic hyponatremia Serum sodium 133 from 130. He completed gentle IV fluid hydration normal saline at 50 cc/h. Continue to monitor  Resolved refractory hypokalemia Serum potassium 3.3>> 3.9 Replete orally 40 mEq daily x3 days. Magnesium 1.9. Repeat BMP in the morning.  Type 2 diabetes with hyperglycemia Hemoglobin A1c 10.6 on 10/23/2020 CBGs are not at goal Increase Lantus dose to 37 units twice daily. NovoLog dose increased to 7 units before meals. Continue insulin sliding scale. Continue to monitor CBGs.  Essential hypertension Continue Norvasc 10 mg daily, clonidine patch 0.2 mg every 7 days. Continue to monitor vital signs.  Hyperlipidemia Continue home Lipitor.  Diabetes polyneuropathy Continue home gabapentin  Ambulatory dysfunction post orthopedic surgery. PT OT to assess Fall precautions.  Code Status: Full code.  Family Communication: None at bedside.  Disposition Plan: Likely will discharge to home once orthopedic surgery signs off possibly on 10/26/2020 or when deep wound culture results.   Consultants:  Orthopedic surgery.  Procedures:  None at this time.  Antimicrobials:  IV vancomycin, cefepime, p.o. Flagyl, day #4.  DVT prophylaxis: Subcu enoxaparin daily.  Status is: Inpatient    Dispo:  Patient From: Home  Planned Disposition: Home   Anticipated discharge date: 10/28/2020 or when orthopedic surgery signs  off or when deep wound culture results.  Medically stable for discharge: No,  ongoing management postop.         Objective: Vitals:   10/26/20 0413 10/26/20 0623 10/26/20 0651 10/26/20 0753  BP: (!) 182/72 (!) 165/82 (!) 164/75 (!) 167/77  Pulse: 95 89 86 87  Resp:  16  18  Temp: (!) 100.8 F (38.2 C) 99.6 F (37.6 C)  99.3 F (37.4 C)  TempSrc: Oral Oral  Oral  SpO2: 97% 97%  96%  Weight:      Height:        Intake/Output Summary (Last 24 hours) at 10/26/2020 1526 Last data filed at 10/26/2020 1427 Gross per 24 hour  Intake 2132.46 ml  Output 800 ml  Net 1332.46 ml   Filed Weights   10/23/20 0934 10/25/20 0820  Weight: 112 kg 112 kg    Exam:  . General: 70 y.o. year-old male well-developed.  Alert and oriented x3. \ . Cardiovascular: Regular rate and rhythm no rubs or gallops.   Marland Kitchen Respiratory: Clear to auscultation no wheezes or rales. . Abdomen: Soft nontender normal bowel sounds present. . Musculoskeletal: Left great toe in surgical dressing.  Trace lower extremity edema bilaterally.   . Skin: Left great toe in surgical dressing. Marland Kitchen Psychiatry: Mood is appropriate for condition setting.  Data Reviewed: CBC: Recent Labs  Lab 10/23/20 1023 10/23/20 1950 10/24/20 0201 10/25/20 0115 10/26/20 0248  WBC 15.9* 17.7* 14.1* 15.8* 15.1*  NEUTROABS 12.3* 13.4*  --   --   --   HGB 11.7* 12.0* 11.3* 10.5* 10.6*  HCT 32.9* 34.4* 30.0* 28.4* 28.9*  MCV 82.9 83.3 80.9 81.4 82.1  PLT 347 348 337 366 004   Basic Metabolic Panel: Recent Labs  Lab 10/23/20 1023 10/23/20 1950 10/24/20 0201 10/25/20 0115 10/26/20 0248  NA 131* 128* 128* 130* 133*  K 3.4* 3.6 3.3* 3.3* 3.9  CL 92* 91* 91* 93* 99  CO2 26 22 26 25 24   GLUCOSE 280* 447* 378* 317* 250*  BUN 23 23 21 19 18   CREATININE 2.09* 1.92* 1.82* 1.84* 1.82*  CALCIUM 8.5* 8.2* 8.1* 8.0* 8.1*  MG  --   --   --  1.9  --   PHOS  --   --   --  2.4*  --    GFR: Estimated Creatinine Clearance: 50.9 mL/min (A) (by C-G formula based on SCr of 1.82 mg/dL (H)). Liver Function Tests: Recent Labs   Lab 10/23/20 1023  AST 29  ALT 27  ALKPHOS 92  BILITOT 1.1  PROT 8.2*  ALBUMIN 3.0*   No results for input(s): LIPASE, AMYLASE in the last 168 hours. No results for input(s): AMMONIA in the last 168 hours. Coagulation Profile: No results for input(s): INR, PROTIME in the last 168 hours. Cardiac Enzymes: No results for input(s): CKTOTAL, CKMB, CKMBINDEX, TROPONINI in the last 168 hours. BNP (last 3 results) No results for input(s): PROBNP in the last 8760 hours. HbA1C: Recent Labs    10/23/20 1950  HGBA1C 10.6*   CBG: Recent Labs  Lab 10/25/20 1128 10/25/20 1649 10/25/20 2116 10/26/20 0900 10/26/20 1142  GLUCAP 197* 264* 192* 218* 339*   Lipid Profile: No results for input(s): CHOL, HDL, LDLCALC, TRIG, CHOLHDL, LDLDIRECT in the last 72 hours. Thyroid Function Tests: No results for input(s): TSH, T4TOTAL, FREET4, T3FREE, THYROIDAB in the last 72 hours. Anemia Panel: No results for input(s): VITAMINB12, FOLATE, FERRITIN, TIBC, IRON, RETICCTPCT in the last 72 hours. Urine analysis:  Component Value Date/Time   COLORURINE YELLOW 10/23/2020 1935   APPEARANCEUR CLEAR 10/23/2020 1935   LABSPEC 1.017 10/23/2020 1935   PHURINE 5.0 10/23/2020 1935   GLUCOSEU >=500 (A) 10/23/2020 1935   HGBUR SMALL (A) 10/23/2020 1935   BILIRUBINUR NEGATIVE 10/23/2020 1935   KETONESUR 5 (A) 10/23/2020 1935   PROTEINUR 30 (A) 10/23/2020 1935   UROBILINOGEN 0.2 02/24/2015 1600   NITRITE NEGATIVE 10/23/2020 1935   LEUKOCYTESUR NEGATIVE 10/23/2020 1935   Sepsis Labs: @LABRCNTIP (procalcitonin:4,lacticidven:4)  ) Recent Results (from the past 240 hour(s))  SARS CORONAVIRUS 2 (TAT 6-24 HRS) Nasopharyngeal Nasopharyngeal Swab     Status: None   Collection Time: 10/23/20 11:50 AM   Specimen: Nasopharyngeal Swab  Result Value Ref Range Status   SARS Coronavirus 2 NEGATIVE NEGATIVE Final    Comment: (NOTE) SARS-CoV-2 target nucleic acids are NOT DETECTED.  The SARS-CoV-2 RNA is  generally detectable in upper and lower respiratory specimens during the acute phase of infection. Negative results do not preclude SARS-CoV-2 infection, do not rule out co-infections with other pathogens, and should not be used as the sole basis for treatment or other patient management decisions. Negative results must be combined with clinical observations, patient history, and epidemiological information. The expected result is Negative.  Fact Sheet for Patients: SugarRoll.be  Fact Sheet for Healthcare Providers: https://www.woods-mathews.com/  This test is not yet approved or cleared by the [place] FDA and  has been authorized for detection and/or diagnosis of SARS-CoV-2 by FDA under an Emergency Use Authorization (EUA). This EUA will remain  in effect (meaning this test can be used) for the duration of the COVID-19 declaration under Se ction 564(b)(1) of the Act, 21 U.S.C. section 360bbb-3(b)(1), unless the authorization is terminated or revoked sooner.  Performed at [facility], [place] [address], [place], [place] [address]   Culture, blood (routine x 2)     Status: None (Preliminary result)   Collection Time: 10/24/20 12:51 PM   Specimen: BLOOD  Result Value Ref Range Status   Specimen Description BLOOD RIGHT ANTECUBITAL  Final   Special Requests   Final    BOTTLES DRAWN AEROBIC AND ANAEROBIC Blood Culture adequate volume   Culture   Final    NO GROWTH 2 DAYS Performed at [facility], [place] [address] [place], [place] [address]    Report Status PENDING  Incomplete  Culture, blood (routine x 2)     Status: None (Preliminary result)   Collection Time: 10/24/20 12:51 PM   Specimen: BLOOD LEFT HAND  Result Value Ref Range Status   Specimen Description BLOOD LEFT HAND  Final   Special Requests   Final    BOTTLES DRAWN AEROBIC AND ANAEROBIC Blood Culture adequate volume   Culture   Final    NO GROWTH 2  DAYS Performed at [facility], [place] [address] [place],  [address]    Report Status PENDING  Incomplete  Aerobic/Anaerobic Culture w Gram Stain (surgical/deep wound)     Status: None (Preliminary result)   Collection Time: 10/25/20  9:14 AM   Specimen: PATH Digit amputation; Tissue  Result Value Ref Range Status   Specimen Description TISSUE LEFT TOE SPEC A  Final   Special Requests LEFT TOE  Final   Gram Stain   Final    RARE WBC PRESENT,BOTH PMN AND MONONUCLEAR FEW GRAM POSITIVE COCCI IN PAIRS RARE GRAM NEGATIVE RODS    Culture   Final    FEW PROTEUS MIRABILIS  CULTURE REINCUBATED FOR BETTER GROWTH Performed at [facility], [place] [address], [place], [place] [address]    Report Status PENDING  Incomplete      Studies: No results found.  Scheduled Meds: . (feeding supplement) PROSource Plus  30 mL Oral Daily  . amLODipine  10 mg Oral Daily  . atorvastatin  40 mg Oral Daily  . cloNIDine  0.2 mg Transdermal Weekly  . enoxaparin (LOVENOX) injection  55 mg Subcutaneous QHS  . gabapentin  800 mg Oral TID  . insulin aspart  0-15 Units Subcutaneous TID WC  . insulin aspart  7 Units Subcutaneous TID WC  . insulin detemir  35 Units Subcutaneous BID  . methocarbamol  500 mg Oral BID  . metroNIDAZOLE  500 mg Oral Q8H  . multivitamin with minerals  1 tablet Oral Daily  . potassium chloride  40 mEq Oral Daily  . Ensure Max Protein  11 oz Oral BID    Continuous Infusions: . ceFEPime (MAXIPIME) IV Stopped (10/26/20 0958)  . lactated ringers    . vancomycin 1,000 mg (10/26/20 1427)     LOS: 3 days     Kayleen Memos, MD [facility] Pager [phone]  If 7PM-7AM, please contact night-coverage [email or web address] Password [facility] 10/26/2020, 3:26 PM

## 2020-10-26 NOTE — Op Note (Signed)
Richard Russell male 70 y.o. 10/25/2020   PreOperative Diagnosis: Left hallux distal phalanx osteomyelitis Left hallux cellulitis with soft tissue necrosis  PostOperative Diagnosis: Left hallux distal phalanx osteomyelitis Left hallux cellulitis with soft tissue necrosis Left forefoot abscess, multiple bursal cavities  PROCEDURE: Left hallux MTP disarticulation Left forefoot abscess incision and drainage multiple bursal cavities  SURGEON: Melony Overly, MD  ASSISTANT: None  ANESTHESIA: MAC with peripheral nerve blockade by anesthesia  FINDINGS: Left hallux tip ulceration with distal phalanx osteomyelitis and forefoot abscess with surrounding cellulitis  IMPLANTS: None  INDICATIONS:69 y.o. male with a history of uncontrolled diabetes developed an ulceration on the tip of his left hallux.  He presented to the ER with continued pain, swelling and drainage of his hallux.  X-rays and MRI revealed osteomyelitis of the distal phalanx with surrounding soft tissue swelling.  Given his infection he was indicated for surgery.   Patient understood the risks, benefits and alternatives to surgery which include but are not limited to wound healing complications, infection, nonunion, malunion, need for further surgery as well as damage to surrounding structures. They also understood the potential for continued pain in that there were no guarantees of acceptable outcome After weighing these risks the patient opted to proceed with surgery.  PROCEDURE: Patient was identified in the preoperative holding area.  The left foot was marked by myself.  Consent was signed by myself and the patient.  Block was performed by anesthesia in the preoperative holding area.  Patient was taken to the operative suite and placed supine on the operative table.  MAC anesthesia was induced without difficulty. Bump was placed under the operative hip and bone foam was used.  All bony prominences were well padded.   Preoperative antibiotics were given. The extremity was prepped and draped in the usual sterile fashion and surgical timeout was performed.  No tourniquet was used.  We began by making a circumferential incision about the IP joint of the hallux.  This was taken sharply down to bone.  The hallux IP joint was disarticulated.  It was noted that there was continued abscess cavity proximally within the hallux and therefore the incision was revised.  An incision along the medial border of the foot in a racquet fashion was created about this area.  This was taken down to the proximal phalanx bone.  Then the MTP joint was identified and there was a rush of purulent fluid from the MTP joint once this was incised.  The proximal phalanx was disarticulated.  The flexor houses longus tendon was identified and traction tenotomy was performed.  There was bleeding about the skin flaps.  The bone and soft tissue was sent for pathology and microbiology.    Then the abscess cavity of the dorsal aspect of the foot was identified.  Dissection scissors were placed within the abscess cavity to decompress this.  This was done for multiple bursal spaces.  The devitalized tissue was discarded after sharp dissection.  The area was then irrigated copiously with normal saline.  There was some continued cellulitis of the skin but adequate skin for loose closure.  The wound was then closed using a 2-0 nylon suture.  This was done in a tension-free fashion.  The foot was then placed in a soft dressing.  He was awakened from anesthesia and taken recovery in stable condition.  POST OPERATIVE INSTRUCTIONS: Heel weightbearing in a postoperative shoe Keep dressing in place Would recommend continued antibiotic therapy to ensure complete eradication of the  cellulitis.  This can be monitored as an outpatient when deemed suitable for discharge by hospitalist team. Recommend better diabetic control He will follow-up in 2 weeks for wound check.   Sutures likely to remain in 4 weeks given diabetes.  TOURNIQUET TIME: No tourniquet was used  BLOOD LOSS:  less than 50 mL         DRAINS: none         SPECIMEN: none       COMPLICATIONS:  * No complications entered in OR log *         Disposition: PACU - hemodynamically stable.         Condition: stable

## 2020-10-26 NOTE — Progress Notes (Signed)
     Denis ELIZARDO CHILSON is a 70 y.o. male   Orthopaedic diagnosis: POD #1 status post left hallux amputation with incision and drainage of forefoot abscess  Subjective: Patient is resting comfortably.  Denies significant pain.  Denies fevers currently.  No shortness of breath.  Objectyive: Vitals:   10/26/20 0651 10/26/20 0753  BP: (!) 164/75 (!) 167/77  Pulse: 86 87  Resp:  18  Temp:  99.3 F (37.4 C)  SpO2:  96%     Exam: Awake and alert Respirations even and unlabored No acute distress  Left foot with dressing in place.  No saturation.  He is able to dorsiflex and plantarflex the ankle.  He has some swelling about the ankle and leg.  No evidence of wound on the right foot.  Baseline sensory deficits.  Toes are warm and well-perfused.  Gram stain: Few gram positive cocci, rare gram neg rods  Assessment: POD #1 status post left hallux amputation with I&D of forefoot abscess   Plan: Patient is doing well.  He did have some residual cellulitis after the amputation and there was evidence of abscess cavity proximally along the hallux and dorsal foot.  This was drained and washed.  Specimen was sent for culture. Patient will likely need a course of antibiotics after discharge to ensure complete eradication of the surrounding cellulitis of his left foot. Keep dressing in place. Postoperative shoe ordered and he may be heel weightbearing.  This was stressed with him today due to the importance of no tension on the amputation repair site. He will follow-up with me in 2 weeks for wound check.  Sutures likely to remain in 4 weeks at a minimum depending on wound state.   Nicki Gu[place]aj[place], MD

## 2020-10-26 NOTE — Plan of Care (Signed)

## 2020-10-26 NOTE — Plan of Care (Signed)
°  Problem: Education: °Goal: Knowledge of General Education information will improve °Description: Including pain rating scale, medication(s)/side effects and non-pharmacologic comfort measures °Outcome: Progressing °  °Problem: Clinical Measurements: °Goal: Respiratory complications will improve °Outcome: Progressing °  °Problem: Activity: °Goal: Risk for activity intolerance will decrease °Outcome: Progressing °  °Problem: Coping: °Goal: Level of anxiety will decrease °Outcome: Progressing °  °Problem: Elimination: °Goal: Will not experience complications related to urinary retention °Outcome: Progressing °  °Problem: Pain Managment: °Goal: General experience of comfort will improve °Outcome: Progressing °  °Problem: Safety: °Goal: Ability to remain free from injury will improve °Outcome: Progressing °  °

## 2020-10-26 NOTE — Progress Notes (Signed)
Inpatient Diabetes Program Recommendations  AACE/ADA: New Consensus Statement on Inpatient Glycemic Control (2015)  Target Ranges:  Prepandial:   less than 140 mg/dL      Peak postprandial:   less than 180 mg/dL (1-2 hours)      Critically ill patients:  140 - 180 mg/dL   Lab Results  Component Value Date   GLUCAP 339 (H) 10/26/2020   HGBA1C 10.6 (H) 10/23/2020    Review of Glycemic Control  Diabetes history: DM2 Outpatient Diabetes medications: Novolog 70/30 60 units BID Current orders for Inpatient glycemic control: Levemir 37 units BID, Novolog 0-15 units TID with meals + 7 units TID with meals  HgbA1C - 10.6%  Inpatient Diabetes Program Recommendations:     Increase Novolog to 10 units TID with meals Increase Levemir to 38 units BID  Continue to follow.  Thank you. Ailene Ards, RD, LDN, CDE Inpatient Diabetes Coordinator [phone]

## 2020-10-26 NOTE — Progress Notes (Signed)
Pharmacy Antibiotic Note  Login Richard Russell is a 70 y.o. male admitted on 10/23/2020 with osteomyelitis of the toe.  Pharmacy has been consulted for vancomycin and cefepime dosing.  Plan: - Adjust Vancomycin to 1g IV every 24 hours (eAUC 443, Vd 0.5, sCr 1.82) - Consider d/c of Vancomycin soon with GNR isolated on culture - Cont Cefepime 2 g IV every 12 hours - F/u narrowing once cultures result - Will continue to follow renal function, culture results, LOT, and antibiotic de-escalation plans   Height: 6' (182.9 cm) Weight: 112 kg (247 lb) IBW/kg (Calculated) : 77.6  Temp (24hrs), Avg:100.1 F (37.8 C), Min:99.3 F (37.4 C), Max:102 F (38.9 C)  Recent Labs  Lab 10/23/20 1023 10/23/20 1032 10/23/20 1950 10/24/20 0201 10/25/20 0115 10/26/20 0248  WBC 15.9*  --  17.7* 14.1* 15.8* 15.1*  CREATININE 2.09*  --  1.92* 1.82* 1.84* 1.82*  LATICACIDVEN  --  1.4  --   --   --   --     Estimated Creatinine Clearance: 50.9 mL/min (A) (by C-G formula based on SCr of 1.82 mg/dL (H)).    No Known Allergies   Vanc 3/4>> Cefepime 3/4>> Flagyl 3/4>>  3/4 Fluvid >> neg 3/5 BCx >> ngx2d 3/6 L-toe tissue >> few GNR  Thank you for allowing pharmacy to be a part of this patient's care.  Georgina Pillion, PharmD, BCPS Clinical Pharmacist Clinical phone for 10/26/2020: [phone] 10/26/2020 1:29 PM   **Pharmacist phone directory can now be found on [email or web address] ([email or web address]).  Listed under [facility].

## 2020-10-27 DIAGNOSIS — M869 Osteomyelitis, unspecified: Secondary | ICD-10-CM | POA: Diagnosis not present

## 2020-10-27 LAB — BASIC METABOLIC PANEL
Anion gap: 11 (ref 5–15)
BUN: 18 mg/dL (ref 8–23)
CO2: 23 mmol/L (ref 22–32)
Calcium: 8 mg/dL — ABNORMAL LOW (ref 8.9–10.3)
Chloride: 97 mmol/L — ABNORMAL LOW (ref 98–111)
Creatinine, Ser: 1.6 mg/dL — ABNORMAL HIGH (ref 0.61–1.24)
GFR, Estimated: 47 mL/min — ABNORMAL LOW (ref >60)
Glucose, Bld: 244 mg/dL — ABNORMAL HIGH (ref 70–99)
Potassium: 3.5 mmol/L (ref 3.5–5.1)
Sodium: 131 mmol/L — ABNORMAL LOW (ref 135–145)

## 2020-10-27 LAB — CBC
HCT: 29.2 % — ABNORMAL LOW (ref 39.0–52.0)
Hemoglobin: 10.7 g/dL — ABNORMAL LOW (ref 13.0–17.0)
MCH: 30 pg (ref 26.0–34.0)
MCHC: 36.6 g/dL — ABNORMAL HIGH (ref 30.0–36.0)
MCV: 81.8 fL (ref 80.0–100.0)
Platelets: 399 10*3/uL (ref 150–400)
RBC: 3.57 MIL/uL — ABNORMAL LOW (ref 4.22–5.81)
RDW: 11.8 % (ref 11.5–15.5)
WBC: 15.8 10*3/uL — ABNORMAL HIGH (ref 4.0–10.5)
nRBC: 0 % (ref 0.0–0.2)

## 2020-10-27 LAB — SEDIMENTATION RATE: Sed Rate: 137 mm/hr — ABNORMAL HIGH (ref 0–16)

## 2020-10-27 LAB — MRSA PCR SCREENING: MRSA by PCR: NEGATIVE

## 2020-10-27 LAB — GLUCOSE, CAPILLARY
Glucose-Capillary: 212 mg/dL — ABNORMAL HIGH (ref 70–99)
Glucose-Capillary: 259 mg/dL — ABNORMAL HIGH (ref 70–99)
Glucose-Capillary: 289 mg/dL — ABNORMAL HIGH (ref 70–99)
Glucose-Capillary: 247 mg/dL — ABNORMAL HIGH (ref 70–99)

## 2020-10-27 LAB — C-REACTIVE PROTEIN: CRP: 28.8 mg/dL — ABNORMAL HIGH (ref <1.0)

## 2020-10-27 MED ORDER — HYDRALAZINE HCL 10 MG PO TABS
10.0000 mg | ORAL_TABLET | Freq: Three times a day (TID) | ORAL | Status: DC
  Administered 2020-10-27 – 2020-10-29 (×7): 10 mg via ORAL
  Filled 2020-10-27 (×7): qty 1

## 2020-10-27 MED ORDER — VANCOMYCIN HCL 1250 MG/250ML IV SOLN: 1250.0000 mg | INTRAVENOUS | Status: DC

## 2020-10-27 MED ORDER — VANCOMYCIN HCL 1750 MG/350ML IV SOLN
1750.0000 mg | INTRAVENOUS | Status: DC
  Administered 2020-10-27: 1750 mg via INTRAVENOUS
  Filled 2020-10-27: qty 350

## 2020-10-27 NOTE — Plan of Care (Signed)

## 2020-10-27 NOTE — Progress Notes (Signed)
PROGRESS NOTE  Richard Russell GBT:[id number] DOB: October 17, 1950 DOA: 10/23/2020 PCP: [facility]  HPI/Recap of past 24 hours: Richard Russell is a 70 y.o. male with medical history significant of hypercholesterolemia, hypertension, type 2 diabetes mellitus with renal manifestations, diabetic peripheral neuropathy, class I obesity, history of back surgery with occasional back pain exacerbation who is sent from [facility] after presenting there with 1 month history of left great toe injury, which turned into an ulcer that he has been managing with unknown OTC spray and pressure stockings.  He recently reinjured the area developing more erythema, edema, calor and purulent discharge.  He does not complain of significant pain because decreased sensation secondary to diabetic peripheral neuropathy.      Work-up revealed left great toe osteomyelitis, diagnosed with MRI.  ESR 127 and CRP 34 also consistent with osteomyelitis.  He is currently on broad-spectrum IV antibiotics IV vancomycin, cefepime, p.o. Flagyl.  Will obtain blood cultures x2 peripherally in the setting of sepsis.  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 Lucia Gaskins on 10/25/2020.  10/27/20: Seen and examined at bedside.  States he is feeling better today.  Low-grade fever overnight with T-max of 100.1.  WBC elevated.  Deep wound culture growing Proteus mirabilis, pansensitive.  DC IV vancomycin.   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 Lucia Gaskins on 10/25/2020. Presented with fever with T-max of 100.6, leukocytosis and evidence of left  great toe osteomyelitis on MRI left foot. ESR 127, CRP 34 on 10/23/2020 CRP downtrending Deep wound culture growing Proteus mirabilis, pansensitive. He is currently on broad-spectrum IV antibiotics IV vancomycin, cefepime, p.o. Flagyl, day #4.   DC IV vancomycin on 10/27/2020. Blood cultures negative to date, continue to follow cultures.    Nonoliguric AKI on CKD 3B Appears to be back to his baseline creatinine 1.6 with GFR 47. Presented with creatinine 1.92 with GFR of 38. Continue to monitor nephrotoxic agents and dehydration. Good urine output Continue to monitor urine output.  Hypovolemic hyponatremia He completed gentle IV fluid hydration normal saline at 50 cc/h. Encourage oral intake. Serum sodium 131.  Resolved refractory hypokalemia Serum potassium 3.3>> 3.9>.  3.5. Replete orally 40 mEq daily x3 days. Magnesium 1.9. Repeat BMP in the morning.  Type 2 diabetes with hyperglycemia Hemoglobin A1c 10.6 on 10/23/2020 CBGs are not at goal Increase Lantus dose to 37 units twice daily. NovoLog dose increased to 7 units before meals. Continue insulin sliding scale. Continue to monitor CBGs.  Essential hypertension BP is not at goal Added p.o. hydralazine 10 mg 3 times daily. Continue Norvasc 10 mg daily, clonidine patch 0.2 mg every 7 days. Continue to monitor vital signs.  Hyperlipidemia Continue home Lipitor.  Diabetes polyneuropathy Continue home gabapentin  Ambulatory dysfunction post orthopedic surgery. PT recommended home health PT Fall precautions.  Code Status: Full code.  Family Communication: None at bedside.  Disposition Plan: Likely will discharge to home once WBC trends down likely on 10/28/2020, follow deep wound culture for ID and sensitivities.   Consultants:  Orthopedic surgery.  Procedures:  None at this time.  Antimicrobials:  IV vancomycin, cefepime,  p.o. Flagyl, day #4.  DVT prophylaxis: Subcu enoxaparin daily.  Status is:  Inpatient    Dispo:  Patient From: Home  Planned Disposition: Home with home health services.     Objective: Vitals:   10/27/20 0549 10/27/20 0653 10/27/20 0826 10/27/20 1322  BP: (!) 182/81 (!) 185/86 (!) 179/89 (!) 170/76  Pulse: 92 93 96 92  Resp: 16  17 16   Temp: 99.8 F (37.7 C)  98.6 F (37 C) 98.7 F (37.1 C)  TempSrc: Oral  Oral Oral  SpO2: 94%  99% 98%  Weight:      Height:        Intake/Output Summary (Last 24 hours) at 10/27/2020 1810 Last data filed at 10/27/2020 1608 Gross per 24 hour  Intake 1030 ml  Output 2200 ml  Net -1170 ml   Filed Weights   10/23/20 0934 10/25/20 0820  Weight: 112 kg 112 kg    Exam:  . General: 69 y.o. year-old male well-developed well-nourished in no acute distress.  He is alert oriented x3.   . Cardiovascular: Regular rate and rhythm no rubs or gallops.   Marland Kitchen Respiratory: Clear to auscultation no wheezes or rales.   . Abdomen: Soft Nontender Normal Bowel Sounds Present. . Musculoskeletal: Left great toe in surgical dressing.  Trace lower extremity edema bilaterally.   . Skin: Left great toe in surgical dressing. Marland Kitchen Psychiatry: Mood is appropriate for condition and setting.  Data Reviewed: CBC: Recent Labs  Lab 10/23/20 1023 10/23/20 1950 10/24/20 0201 10/25/20 0115 10/26/20 0248 10/27/20 0134  WBC 15.9* 17.7* 14.1* 15.8* 15.1* 15.8*  NEUTROABS 12.3* 13.4*  --   --   --   --   HGB 11.7* 12.0* 11.3* 10.5* 10.6* 10.7*  HCT 32.9* 34.4* 30.0* 28.4* 28.9* 29.2*  MCV 82.9 83.3 80.9 81.4 82.1 81.8  PLT 347 348 337 366 384 626   Basic Metabolic Panel: Recent Labs  Lab 10/23/20 1950 10/24/20 0201 10/25/20 0115 10/26/20 0248 10/27/20 0134  NA 128* 128* 130* 133* 131*  K 3.6 3.3* 3.3* 3.9 3.5  CL 91* 91* 93* 99 97*  CO2 22 26 25 24 23   GLUCOSE 447* 378* 317* 250* 244*  BUN 23 21 19 18 18   CREATININE 1.92* 1.82* 1.84* 1.82* 1.60*  CALCIUM 8.2* 8.1* 8.0* 8.1* 8.0*  MG  --   --  1.9  --   --   PHOS  --   --  2.4*  --    --    GFR: Estimated Creatinine Clearance: 57.9 mL/min (A) (by C-G formula based on SCr of 1.6 mg/dL (H)). Liver Function Tests: Recent Labs  Lab 10/23/20 1023  AST 29  ALT 27  ALKPHOS 92  BILITOT 1.1  PROT 8.2*  ALBUMIN 3.0*   No results for input(s): LIPASE, AMYLASE in the last 168 hours. No results for input(s): AMMONIA in the last 168 hours. Coagulation Profile: No results for input(s): INR, PROTIME in the last 168 hours. Cardiac Enzymes: No results for input(s): CKTOTAL, CKMB, CKMBINDEX, TROPONINI in the last 168 hours. BNP (last 3 results) No results for input(s): PROBNP in the last 8760 hours. HbA1C: No results for input(s): HGBA1C in the last 72 hours. CBG: Recent Labs  Lab 10/26/20 1654 10/26/20 2040 10/27/20 0651 10/27/20 1132 10/27/20 1709  GLUCAP 246* 217* 212* 259* 289*   Lipid Profile: No results for input(s): CHOL, HDL, LDLCALC, TRIG, CHOLHDL, LDLDIRECT in the last 72 hours. Thyroid Function Tests: No results for input(s): TSH, T4TOTAL,  FREET4, T3FREE, THYROIDAB in the last 72 hours. Anemia Panel: No results for input(s): VITAMINB12, FOLATE, FERRITIN, TIBC, IRON, RETICCTPCT in the last 72 hours. Urine analysis:    Component Value Date/Time   COLORURINE YELLOW 10/23/2020 1935   APPEARANCEUR CLEAR 10/23/2020 1935   LABSPEC 1.017 10/23/2020 1935   PHURINE 5.0 10/23/2020 1935   GLUCOSEU >=500 (A) 10/23/2020 1935   HGBUR SMALL (A) 10/23/2020 1935   BILIRUBINUR NEGATIVE 10/23/2020 1935   KETONESUR 5 (A) 10/23/2020 1935   PROTEINUR 30 (A) 10/23/2020 1935   UROBILINOGEN 0.2 02/24/2015 1600   NITRITE NEGATIVE 10/23/2020 1935   LEUKOCYTESUR NEGATIVE 10/23/2020 1935   Sepsis Labs: @LABRCNTIP (procalcitonin:4,lacticidven:4)  ) Recent Results (from the past 240 hour(s))  SARS CORONAVIRUS 2 (TAT 6-24 HRS) Nasopharyngeal Nasopharyngeal Swab     Status: None   Collection Time: 10/23/20 11:50 AM   Specimen: Nasopharyngeal Swab  Result Value Ref Range  Status   SARS Coronavirus 2 NEGATIVE NEGATIVE Final    Comment: (NOTE) SARS-CoV-2 target nucleic acids are NOT DETECTED.  The SARS-CoV-2 RNA is generally detectable in upper and lower respiratory specimens during the acute phase of infection. Negative results do not preclude SARS-CoV-2 infection, do not rule out co-infections with other pathogens, and should not be used as the sole basis for treatment or other patient management decisions. Negative results must be combined with clinical observations, patient history, and epidemiological information. The expected result is Negative.  Fact Sheet for Patients: SugarRoll.be  Fact Sheet for Healthcare Providers: https://www.woods-mathews.com/  This test is not yet approved or cleared by the [place] FDA and  has been authorized for detection and/or diagnosis of SARS-CoV-2 by FDA under an Emergency Use Authorization (EUA). This EUA will remain  in effect (meaning this test can be used) for the duration of the COVID-19 declaration under Se ction 564(b)(1) of the Act, 21 U.S.C. section 360bbb-3(b)(1), unless the authorization is terminated or revoked sooner.  Performed at [facility], [place] [address], [place], [place] [address]   Culture, blood (routine x 2)     Status: None (Preliminary result)   Collection Time: 10/24/20 12:51 PM   Specimen: BLOOD  Result Value Ref Range Status   Specimen Description BLOOD RIGHT ANTECUBITAL  Final   Special Requests   Final    BOTTLES DRAWN AEROBIC AND ANAEROBIC Blood Culture adequate volume   Culture   Final    NO GROWTH 3 DAYS Performed at [facility], [place] [address] [place], [place] [address]    Report Status PENDING  Incomplete  Culture, blood (routine x 2)     Status: None (Preliminary result)   Collection Time: 10/24/20 12:51 PM   Specimen: BLOOD LEFT HAND  Result Value Ref Range Status   Specimen Description BLOOD LEFT HAND   Final   Special Requests   Final    BOTTLES DRAWN AEROBIC AND ANAEROBIC Blood Culture adequate volume   Culture   Final    NO GROWTH 3 DAYS Performed at [facility], [place] [address], [place],  [address]    Report Status PENDING  Incomplete  Aerobic/Anaerobic Culture w Gram Stain (surgical/deep wound)     Status: None (Preliminary result)   Collection Time: 10/25/20  9:14 AM   Specimen: PATH Digit amputation; Tissue  Result Value Ref Range Status   Specimen Description TISSUE LEFT TOE [facility] A  Final   Special Requests LEFT TOE  Final   Gram Stain   Final  RARE WBC PRESENT,BOTH PMN AND MONONUCLEAR FEW GRAM POSITIVE COCCI IN PAIRS RARE GRAM NEGATIVE RODS    Culture   Final    FEW PROTEUS MIRABILIS FEW STAPHYLOCOCCUS AUREUS FEW STREPTOCOCCUS GROUP G Beta hemolytic streptococci are predictably susceptible to penicillin and other beta lactams. Susceptibility testing not routinely performed. FEW DIPHTHEROIDS(CORYNEBACTERIUM SPECIES) Standardized susceptibility testing for this organism is not available. SUSCEPTIBILITIES TO FOLLOW FOR STAPHYLOCOCCUS AUREUS Performed at [facility], [place] [address] [place], [place] [address]    Report Status PENDING  Incomplete   Organism ID, Bacteria PROTEUS MIRABILIS  Final      Susceptibility   Proteus mirabilis - MIC*    AMPICILLIN <=2 SENSITIVE Sensitive     CEFAZOLIN 8 SENSITIVE Sensitive     CEFEPIME <=0.12 SENSITIVE Sensitive     CEFTAZIDIME <=1 SENSITIVE Sensitive     CEFTRIAXONE <=0.25 SENSITIVE Sensitive     CIPROFLOXACIN <=0.25 SENSITIVE Sensitive     GENTAMICIN <=1 SENSITIVE Sensitive     IMIPENEM 2 SENSITIVE Sensitive     TRIMETH/SULFA <=20 SENSITIVE Sensitive     AMPICILLIN/SULBACTAM <=2 SENSITIVE Sensitive     PIP/TAZO <=4 SENSITIVE Sensitive     * FEW PROTEUS MIRABILIS  MRSA PCR Screening     Status: None   Collection Time: 10/27/20  7:03 AM   Specimen: Nasopharyngeal  Result Value Ref Range Status   MRSA  by PCR NEGATIVE NEGATIVE Final    Comment:        The GeneXpert MRSA Assay (FDA approved for NASAL specimens only), is one component of a comprehensive MRSA colonization surveillance program. It is not intended to diagnose MRSA infection nor to guide or monitor treatment for MRSA infections. Performed at [facility], [place] [address] [place], [place] [address]       Studies: No results found.  Scheduled Meds: . (feeding supplement) PROSource Plus  30 mL Oral Daily  . amLODipine  10 mg Oral Daily  . atorvastatin  40 mg Oral Daily  . cloNIDine  0.2 mg Transdermal Weekly  . enoxaparin (LOVENOX) injection  55 mg Subcutaneous QHS  . gabapentin  800 mg Oral TID  . hydrALAZINE  10 mg Oral Q8H  . insulin aspart  0-15 Units Subcutaneous TID WC  . insulin aspart  7 Units Subcutaneous TID WC  . insulin detemir  37 Units Subcutaneous BID  . methocarbamol  500 mg Oral BID  . metroNIDAZOLE  500 mg Oral Q8H  . multivitamin with minerals  1 tablet Oral Daily  . Ensure Max Protein  11 oz Oral BID    Continuous Infusions: . ceFEPime (MAXIPIME) IV Stopped (10/27/20 7793)  . lactated ringers    . [START ON 10/28/2020] vancomycin       LOS: 4 days     Kayleen Memos, MD [facility] Pager [phone]  If 7PM-7AM, please contact night-coverage [email or web address] Password [facility] 10/27/2020, 6:10 PM

## 2020-10-27 NOTE — Discharge Instructions (Addendum)
Information on my medicine - ELIQUIS (apixaban)  This medication education was reviewed with me or my healthcare representative as part of my discharge preparation.    Why was Eliquis prescribed for you? 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  What do You need to know about Eliquis ? The starting dose is 10 mg (two 5 mg tablets) taken TWICE daily for the FIRST SEVEN (7) DAYS, then on  3/17  the dose is reduced to ONE 5 mg tablet taken TWICE daily.  Eliquis may be taken with or without food.   Try to take the dose about the same time in the morning and in the evening. If you have difficulty swallowing the tablet whole please discuss with your pharmacist how to take the medication safely.  Take Eliquis exactly as prescribed and DO NOT stop taking Eliquis without talking to the doctor who prescribed the medication.  Stopping may increase your risk of developing a new blood clot.  Refill your prescription before you run out.  After discharge, you should have regular check-up appointments with your healthcare provider that is prescribing your Eliquis.    What do you do if you miss a dose? If a dose of ELIQUIS is not taken at the scheduled time, take it as soon as possible on the same day and twice-daily administration should be resumed. The dose should not be doubled to make up for a missed dose.  Important Safety Information A possible side effect of Eliquis is bleeding. You should call your healthcare provider right away if you experience any of the following: ? Bleeding from an injury or your nose that does not stop. ? Unusual colored urine (red or dark brown) or unusual colored stools (red or black). ? Unusual bruising for unknown reasons. ? A serious fall or if you hit your head (even if there is no bleeding).  Some  medicines may interact with Eliquis and might increase your risk of bleeding or clotting while on Eliquis. To help avoid this, consult your healthcare provider or pharmacist prior to using any new prescription or non-prescription medications, including herbals, vitamins, non-steroidal anti-inflammatory drugs (NSAIDs) and supplements.  This website has more information on Eliquis (apixaban): http://www.eliquis.com/eliquis/home   Osteomyelitis, Adult  Bone infections, also called osteomyelitis,occur when bacteria or other germs get inside a bone. This can happen if you have an infection in another part of your body that spreads through your blood. It can also happen if you have a wound or a broken bone (fracture) that breaks the skin. A wound or a fracture can allow germs from your skin or from outside of your body to spread to your bone. Bone infections need to be treated quickly to:  Prevent bone damage.  Prevent the infection from spreading to other areas of your body. What are the causes? Most bone infections are caused by bacteria. The most common bacteria is one found on the skin (staphylococcus). Bone infections can also be caused by other germs, such as viruses and funguses. What increases the risk? You are more likely to develop this condition if:  You recently had surgery, especially bone or joint surgery.  You have had an injury, such as stepping on a nail or having a fracture that exposes bones through the skin.  You have a long-term (chronic) disease, such as: ?  Diabetes. ? HIV (human immunodeficiency virus). ? Rheumatoid arthritis. ? Sickle cell anemia. ? Kidney disease that requires dialysis.  You have a condition that affects your body's defense system (immune system) or you take medicines that block or weaken the immune system.  You have a condition that reduces your blood flow.  You have an artificial joint.  You have had a joint or bone repaired with plates or  screws.  You use IV drugs. What are the signs or symptoms? Symptoms vary depending on the type and location of your infection. Common symptoms of bone infections include:  Fever and chills.  Skin redness and warmth.  Swelling.  Pain and stiffness.  Drainage of fluid or pus near the infection. How is this diagnosed? This condition may be diagnosed based on:  Your symptoms and medical history.  A physical exam.  Tests, such as: ? A sample of tissue, fluid, or blood taken to be examined under a microscope. ? Pus or discharge swabbed from a wound for testing. This is to identify the type of germs and to determine what type of medicine will kill them. ? Blood tests.  Imaging studies, including X-rays, MRI, CT scan, bone scan, or ultrasound. How is this treated? Treatment for this condition depends on the cause and type of infection. Antibiotic medicines are usually the first treatment for a bone infection. This may be done in a hospital at first. You may have to continue IV antibiotics at home or take antibiotics by mouth for several weeks after that. Other treatments may include surgery to:  Remove dead or dying tissue from a bone.  Remove an infected artificial joint.  Remove infected plates or screws that were used to repair a broken bone. Follow these instructions at home: Medicines  Take over-the-counter and prescription medicines as told by your health care provider. Finish all antibiotic medicine even if you start to feel better.  Follow instructions from your health care provider about how to take IV antibiotics at home. You may need to have a nurse come to your home to give you the IV antibiotics. Managing pain, stiffness, and swelling If directed, put ice on the affected area. To do this:  Put ice in a plastic bag.  Place a towel between your skin and the bag.  Leave the ice on for 20 minutes, 2-3 times a day.   General instructions  Ask your health care  provider if you have any restrictions on your activities.  Do not use any products that contain nicotine or tobacco, such as cigarettes, e-cigarettes, and chewing tobacco. If you need help quitting, ask your health care provider.  Keep all follow-up visits as told by your health care provider. This is important. How is this prevented?  Wash your hands often to stop the spread of germs. Wash your hands for at least 20 seconds with soap and water. If soap and water are not available, use hand sanitizer.  Keep any open areas, cuts, or wounds clean. Apply a clean bandage after cleaning the area.  Check wounds frequently for signs of infections. Signs of infection include redness, swelling, warmth, pus, or a bad smell.  Wear proper footwear to avoid injuries to the feet. Contact a health care provider if:  You develop a fever or chills.  You have redness, warmth, pain, or swelling that returns after treatment. Get help right away if:  You have rapid breathing or you have trouble breathing.  You have chest pain.  You cannot  drink fluids or make urine.  The affected area swells, changes color, or turns blue.  You have numbness or severe pain in the affected area. These symptoms may represent a serious problem that is an emergency. Do not wait to see if the symptoms will go away. Get medical help right away. Call your local emergency services (911 in the [place] Do not drive yourself to the hospital. Summary  Bone infections, also called osteomyelitis,occur when bacteria or other germs get inside a bone.  You are more likely to get this type of infection if you have a condition that lowers your ability to fight infections. You are also likely to get this condition if you take medicines that block or weaken the immune system.  Most bone infections are caused by bacteria. They can also be caused by other germs, such as viruses and funguses.  Treatment for this condition usually starts  with taking antibiotics. Further treatment depends on the cause and type of infection. This information is not intended to replace advice given to you by your health care provider. Make sure you discuss any questions you have with your health care provider. Document Revised: 10/24/2019 Document Reviewed: 10/24/2019 Elsevier Patient Education  2021 [employer] Diabetic Neuropathy Diabetic neuropathy refers to nerve damage that is caused by diabetes. Over time, people with diabetes can develop nerve damage throughout the body. There are several types of diabetic neuropathy:  Peripheral neuropathy. This is the most common type of diabetic neuropathy. It damages the nerves that carry signals between the spinal cord and other parts of the body (peripheral nerves). This usually affects nerves in the feet, legs, hands, and arms.  Autonomic neuropathy. This type causes damage to nerves that control involuntary functions (autonomic nerves). Involuntary functions are functions of the body that you do not control. They include heartbeat, body temperature, blood pressure, urination, digestion, sweating, sexual function, or response to changes in blood glucose.  Focal neuropathy. This type of nerve damage affects one area of the body, such as an arm, a leg, or the face. The injury may involve one nerve or a small group of nerves. Focal neuropathy can be painful and unpredictable. It occurs most often in older adults with diabetes. This often develops suddenly, but usually improves over time and does not cause long-term problems.  Proximal neuropathy. This type of nerve damage affects the nerves of the thighs, hips, buttocks, or legs. It causes severe pain, weakness, and muscle death (atrophy), usually in the thigh muscles. It is more common among older men and people who have type 2 diabetes. The length of recovery time may vary. What are the causes? Peripheral, autonomic, and focal neuropathies are caused by  diabetes that is not well controlled with treatment. The cause of proximal neuropathy is not known, but it may be caused by inflammation related to uncontrolled blood glucose levels. What are the signs or symptoms? Peripheral neuropathy Peripheral neuropathy develops slowly over time. When the nerves of the feet and legs no longer work, you may experience:  Burning, stabbing, or aching pain in the legs or feet.  Pain or cramping in the legs or feet.  Loss of feeling (numbness) and inability to feel pressure or pain in the feet. This can lead to: ? Thick calluses or sores on areas of constant pressure. ? Ulcers. ? Reduced ability to feel temperature changes.  Foot deformities.  Muscle weakness.  Loss of balance or coordination. Autonomic neuropathy The symptoms of autonomic neuropathy vary depending on  which nerves are affected. Symptoms may include:  Problems with digestion, such as: ? Nausea or vomiting. ? Poor appetite. ? Bloating. ? Diarrhea or constipation. ? Trouble swallowing. ? Losing weight without trying to.  Problems with the heart, blood, and lungs, such as: ? Dizziness, especially when standing up. ? Fainting. ? Shortness of breath. ? Irregular heartbeat.  Bladder problems, such as: ? Trouble starting or stopping urination. ? Leaking urine. ? Trouble emptying the bladder. ? Urinary tract infections (UTIs).  Problems with other body functions, such as: ? Sweat. You may sweat too much or too little. ? Temperature. You might get hot easily. Or, you might feel cold more than usual. ? Sexual function. Men may not be able to get or maintain an erection. Women may have vaginal dryness and difficulty with arousal. Focal neuropathy Symptoms affect only one area of the body. Common symptoms include:  Numbness.  Tingling.  Burning pain.  Prickling feeling.  Very sensitive skin.  Weakness.  Inability to move (paralysis).  Muscle twitching.  Muscles  getting smaller (wasting).  Poor coordination.  Double or blurred vision. Proximal neuropathy  Sudden, severe pain in the hip, thigh, or buttocks. Pain may spread from the back into the legs (sciatica).  Pain and numbness in the arms and legs.  Tingling.  Loss of bladder control or bowel control.  Weakness and wasting of thigh muscles.  Difficulty getting up from a seated position.  Abdominal swelling.  Unexplained weight loss. How is this diagnosed? Diagnosis varies depending on the type of neuropathy your health care provider suspects. Peripheral neuropathy Your health care provider will do a neurologic exam. This exam checks your reflexes, how you move, and what you can feel. You may have other tests, such as:  Blood tests.  Tests of the fluid that surrounds the spinal cord (lumbar puncture).  CT scan.  MRI.  Checking the nerves that control muscles (electromyogram, or EMG).  Checking how quickly signals pass through your nerves (nerve conduction study).  Checking a small piece of a nerve using a microscope (biopsy). Autonomic neuropathy You may have tests, such as:  Tests to measure your blood pressure and heart rate. You may be secured to an exam table that moves you from a lying position to an upright position (table tilt test).  Breathing tests to check your lungs.  Tests to check how food moves through the digestive system (gastric emptying tests).  Blood, sweat, or urine tests.  Ultrasound of your bladder.  Spinal fluid tests. Focal neuropathy This condition may be diagnosed with:  A neurologic exam.  CT scan.  MRI.  EMG.  Nerve conduction study. Proximal neuropathy There is no test to diagnose this type of neuropathy. You may have tests to rule out other possible causes of this type of neuropathy. Tests may include:  X-rays of your spine and lumbar region.  Lumbar puncture.  MRI. How is this treated? The goal of treatment is to keep  nerve damage from getting worse. Treatment may include:  Following your diabetes management plan. This will help keep your blood glucose level and your A1C level within your target range. This is the most important treatment.  Using prescription pain medicine. Follow these instructions at home: Diabetes management Follow your diabetes management plan as told by your health care provider.  Check your blood glucose levels.  Keep your blood glucose in your target range.  Have your A1C level checked at least two times a year, or as often  as told.  Take over the counter and prescription medicines only as told by your health care provider. This includes insulin and diabetes medicine.   Lifestyle  Do not use any products that contain nicotine or tobacco, such as cigarettes, e-cigarettes, and chewing tobacco. If you need help quitting, ask your health care provider.  Be physically active every day. Include strength training and balance exercises.  Follow a healthy meal plan.  Work with your health care provider to manage your blood pressure.   General instructions  Ask your health care provider if the medicine prescribed to you requires you to avoid driving or using machinery.  Check your skin and feet every day for cuts, bruises, redness, blisters, or sores.  Keep all follow-up visits. This is important. Contact a health care provider if:  You have burning, stabbing, or aching pain in your legs or feet.  You are unable to feel pressure or pain in your feet.  You develop problems with digestion, such as: ? Nausea. ? Vomiting. ? Bloating. ? Constipation. ? Diarrhea. ? Abdominal pain.  You have difficulty with urination, such as: ? Inability to control when you urinate (incontinence). ? Inability to completely empty the bladder (retention).  You feel as if your heart is racing (palpitations).  You feel dizzy, weak, or faint when you stand up. Get help right away if:  You  cannot urinate.  You have sudden weakness or loss of coordination.  You have trouble speaking.  You have pain or pressure in your chest.  You have an irregular heartbeat.  You have sudden inability to move a part of your body. These symptoms may represent a serious problem that is an emergency. Do not wait to see if the symptoms will go away. Get medical help right away. Call your local emergency services (911 in the [place] Do not drive yourself to the hospital. Summary  Diabetic neuropathy is nerve damage that is caused by diabetes. It can cause numbness and pain in the arms, legs, digestive tract, heart, and other body systems.  This condition is treated by keeping your blood glucose level and your A1C level within your target range. This can help prevent neuropathy from getting worse.  Check your skin and feet every day for cuts, bruises, redness, blisters, or sores.  Do not use any products that contain nicotine or tobacco, such as cigarettes, e-cigarettes, and chewing tobacco. This information is not intended to replace advice given to you by your health care provider. Make sure you discuss any questions you have with your health care provider. Document Revised: 12/19/2019 Document Reviewed: 12/19/2019 Elsevier Patient Education  2021 [employer].

## 2020-10-27 NOTE — Progress Notes (Signed)
Inpatient Diabetes Program Recommendations  AACE/ADA: New Consensus Statement on Inpatient Glycemic Control (2015)  Target Ranges:  Prepandial:   less than 140 mg/dL      Peak postprandial:   less than 180 mg/dL (1-2 hours)      Critically ill patients:  140 - 180 mg/dL   Lab Results  Component Value Date   GLUCAP 259 (H) 10/27/2020   HGBA1C 10.6 (H) 10/23/2020    Review of Glycemic Control  Diabetes history: DM2 Outpatient Diabetes medications: Novolog 70/30 60 units BID Current orders for Inpatient glycemic control: Levemir 37 units BID, Novolog 0-15 units TID with meals + 7 units TID with meals  HgbA1C - 10.6%  Inpatient Diabetes Program Recommendations:     Increase Levemir to 40 units BID Increase Novolog to 12 units TID with meals  Spoke with pt about his glucose control and HgbA1C of 10.6%. Pt states he's going to "get back on track" with his diabetes. States he has been drinking regular soda (although he has cut back), grape juice, and sweets. Said he checks blood sugars 2x/day and does not miss his insulin doses. Recommended f/u with PCP for diabetes management and will order OP Diabetes Education consult. Appreciative of visit. Reviewed hypoglycemia s/s and treatment. Pt voices understanding.   Continue to follow.  Thank you. Ailene Ards, RD, LDN, CDE Inpatient Diabetes Coordinator [phone]

## 2020-10-27 NOTE — Progress Notes (Signed)
Orthopedic Tech Progress Note Patient Details:  Richard Russell 1951/04/23 [id number]  Ortho Devices Type of Ortho Device: Darco shoe Ortho Device/Splint Location: LLE Ortho Device/Splint Interventions: Application,Ordered   Post Interventions Patient Tolerated: Well Instructions Provided: Care of device   [name] 10/27/2020, 9:12 AM

## 2020-10-27 NOTE — Evaluation (Signed)
Physical Therapy Evaluation Patient Details Name: Richard Russell MRN: [id number] DOB: [birth year] Today's Date: 10/27/2020   History of Present Illness  Richard Russell is a 70 y.o. male who is sent from [facility] after presenting there with 1 month history of left toe injury, which turned into an ulcer on 10/23/2020. Pt underwnet L great toe amputation on 3/1.  PMH: hypercholesterolemia, hypertension, type 2 diabetes mellitus with renal manifestations, diabetic peripheral neuropathy, class I obesity, history of back surgery with occasional back pain exacerbation.    Clinical Impression  Pt was independent PTA. Pt now requiring RW for safe ambulation with use of L darco shoe to adhere to WBing through the heel only. Pt fatigues quickly but more due to R knee pain in which he reports he needs to get checked out. Suspect pt will progress well for safe d/c home with daughter and use of RW. Acute PT to cont to follow to progress independence with RW and L darco shoe.    Follow Up Recommendations Home health PT;Supervision/Assistance - 24 hour (may progress to only need intermittent)    Equipment Recommendations  Rolling walker with 5" wheels    Recommendations for Other Services       Precautions / Restrictions Precautions Precautions: Fall Required Braces or Orthoses: Other Brace Restrictions Weight Bearing Restrictions: Yes LLE Weight Bearing:  (WBing through heel in darco shoe) Other Position/Activity Restrictions: Darco shoe      Mobility  Bed Mobility Overal bed mobility: Modified Independent             General bed mobility comments: used bed rail to pull self up, HOB slightly elevated    Transfers Overall transfer level: Needs assistance Equipment used: Rolling walker (2 wheeled) Transfers: Sit to/from Stand Sit to Stand: Min assist         General transfer comment: minA to power up and steady during transition of hands from bed to walker, pt able to maintain heel  weightbearing  Ambulation/Gait Ambulation/Gait assistance: Min assist Gait Distance (Feet): 12 Feet Assistive device: Rolling walker (2 wheeled) Gait Pattern/deviations: Step-to pattern;Decreased stance time - left Gait velocity: slow Gait velocity interpretation: <1.8 ft/sec, indicate of risk for recurrent falls General Gait Details: verbal cues for sequencing and how to only weight bear through heel of the darco shoe on the L and to stay in the walker. pt very dependent on UEs due to R knee pain and L LE WBing through heel. pt fatigued quickly but tolerated well  Stairs            Wheelchair Mobility    Modified Rankin (Stroke Patients Only)       Balance Overall balance assessment: Needs assistance Sitting-balance support: Feet supported;No upper extremity supported Sitting balance-Leahy Scale: Good     Standing balance support: During functional activity;Bilateral upper extremity supported Standing balance-Leahy Scale: Poor Standing balance comment: requires RW for safe standing and ambulation to maintain L LE WBing through heel only                             Pertinent Vitals/Pain Pain Assessment: 0-10 Pain Score: 5  Pain Location: L foot at amputation sight Pain Descriptors / Indicators: Discomfort    Home Living Family/patient expects to be discharged to:: Private residence Living Arrangements: Children (dtr) Available Help at Discharge: Available PRN/intermittently (dtr works) Type of Home: House Home Access: Level entry     Home Layout: One level Home  Equipment: None      Prior Function Level of Independence: Independent   Gait / Transfers Assistance Needed: reports going to the gym 4-5days/wk  ADL's / Homemaking Assistance Needed: Ind with ADLs/selfcare  Comments: pt reports that he was going to the gym 4-5 days/wk     Hand Dominance   Dominant Hand: Right    Extremity/Trunk Assessment   Upper Extremity Assessment Upper  Extremity Assessment: Overall WFL for tasks assessed    Lower Extremity Assessment Lower Extremity Assessment: LLE deficits/detail;RLE deficits/detail RLE Deficits / Details: pt with knee pain limiting ability to resist MMT, limited wbing tolerance due to pain LLE Deficits / Details: limited ankle ROM due to amputation otherwise [facility]    Cervical / Trunk Assessment Cervical / Trunk Assessment: Normal  Communication   Communication: No difficulties  Cognition Arousal/Alertness: Awake/alert Behavior During Therapy: WFL for tasks assessed/performed Overall Cognitive Status: Within Functional Limits for tasks assessed                                        General Comments General comments (skin integrity, edema, etc.): L foot in ace wrap, no drainage noted    Exercises     Assessment/Plan    PT Assessment Patient needs continued PT services  PT Problem List Decreased strength;Decreased range of motion;Decreased activity tolerance;Decreased balance;Decreased mobility;Decreased knowledge of use of DME       PT Treatment Interventions DME instruction;Gait training;Stair training;Functional mobility training;Therapeutic activities;Therapeutic exercise;Balance training;Neuromuscular re-education    PT Goals (Current goals can be found in the Care Plan section)  Acute Rehab PT Goals Patient Stated Goal: get home PT Goal Formulation: With patient Time For Goal Achievement: 11/10/20 Potential to Achieve Goals: Good    Frequency Min 4X/week   Barriers to discharge        Co-evaluation               AM-PAC PT "6 Clicks" Mobility  Outcome Measure Help needed turning from your back to your side while in a flat bed without using bedrails?: None Help needed moving from lying on your back to sitting on the side of a flat bed without using bedrails?: None Help needed moving to and from a bed to a chair (including a wheelchair)?: A Little Help needed standing up  from a chair using your arms (e.g., wheelchair or bedside chair)?: A Little Help needed to walk in hospital room?: A Little Help needed climbing 3-5 steps with a railing? : A Lot 6 Click Score: 19    End of Session Equipment Utilized During Treatment: Gait belt (L darco shoe) Activity Tolerance: Patient tolerated treatment well Patient left: in chair;with call bell/phone within reach Nurse Communication: Mobility status PT Visit Diagnosis: Unsteadiness on feet (R26.81);Muscle weakness (generalized) (M62.81);Difficulty in walking, not elsewhere classified (R26.2)    Time: 8657-8469 PT Time Calculation (min) (ACUTE ONLY): 19 min   Charges:   PT Evaluation $PT Eval Moderate Complexity: 1 Mod          Lewis Shock, PT, DPT Acute Rehabilitation Services Pager #: [phone] Office #: [phone]   Iona Hansen 10/27/2020, 11:41 AM

## 2020-10-27 NOTE — Progress Notes (Addendum)
Addendum: Adjust Vancomycin to 1250mg  IV every 24 hours for eAUC 494, Vd 0.5, SCr 1.6.  Consider discontinuation of Vancomycin  Pharmacy Antibiotic Note  Richard Russell is a 70 y.o. male admitted on 10/23/2020 with osteomyelitis of the toe.  Pharmacy has been consulted for vancomycin and cefepime dosing.  SCr improving - 1.6 (unknown baseline). Good UOP recorded.  Fever curve trending down. WBC remains elevated at 15.8.   Plan: - Adjust Vancomycin to 1750g IV every 24 hours (eAUC 493, Vd 0.5, sCr 1.6) - Consider d/c of Vancomycin soon with GNR isolated on culture - Continue Cefepime 2 g IV every 12 hours - F/u narrowing once cultures finalize - Will continue to follow renal function, culture results, LOT, and antibiotic de-escalation plans   Height: 6' (182.9 cm) Weight: 112 kg (247 lb) IBW/kg (Calculated) : 77.6  Temp (24hrs), Avg:99.7 F (37.6 C), Min:98.6 F (37 C), Max:100.1 F (37.8 C)  Recent Labs  Lab 10/23/20 1032 10/23/20 1950 10/24/20 0201 10/25/20 0115 10/26/20 0248 10/27/20 0134  WBC  --  17.7* 14.1* 15.8* 15.1* 15.8*  CREATININE  --  1.92* 1.82* 1.84* 1.82* 1.60*  LATICACIDVEN 1.4  --   --   --   --   --     Estimated Creatinine Clearance: 57.9 mL/min (A) (by C-G formula based on SCr of 1.6 mg/dL (H)).    No Known Allergies   Vanc 3/4>> Cefepime 3/4>> Flagyl 3/4>>  3/4 Fluvid >> neg 3/5 BCx >> ngx2d 3/6 L-toe tissue >> few proteus mirabilis (reincubated)  Thank you for allowing pharmacy to be a part of this patient's care.  12/27/20, PharmD, BCPS, BCCCP Clinical Pharmacist Clinical phone for 10/27/2020: 12/27/2020 10/27/2020 11:01 AM   **Pharmacist phone directory can now be found on [email or web address] ([email or web address]).  Listed under [facility].

## 2020-10-27 NOTE — TOC Initial Note (Signed)
Transition of Care [facility]) - Initial/Assessment Note    Patient Details  Name: Richard Russell MRN: [id number] Date of Birth: 01-28-1951  Transition [facility] CM/SW Contact:    Epifanio Lesches, RN Phone Number: 10/27/2020, 3:26 PM  Clinical Narrative:            - s/p L great toe amputation on 3/1     From home with brother. States PTA  Independent with ADL's.    NCM spoke with pt in regard to discharge planning. HH and DME orders noted. Pt agreeable [facility] services. Choice provided. Pt without preference. Referral made with [facility] and accepted. DME: rolling walker will be provided to pt from floor stock by nurse.  TOC team monitoring and will assist with TOC needs ....  Expected Discharge Plan: Home w Home Health Services (Resides with daughter, Clarene Reamer) Barriers to Discharge: Continued Medical Work up   Patient Goals and CMS Choice     Choice offered to / list presented to : Patient  Expected Discharge Plan and Services Expected Discharge Plan: Home w Home Health Services (Resides with daughter, Clarene Reamer)   Discharge Planning Services: CM Consult Post Acute Care Choice: Home Health Living arrangements for the past 2 months: Single Family Home                 DME Arranged: Walker rolling DME Agency: [employer] Date DME Agency Contacted: 10/27/20 Time DME Agency Contacted: [phone] Representative spoke with at DME Agency: Nurse will provide pt from [facility] Arranged: PT, OT,RN HH Agency: [facility] Date [facility] Agency Contacted: 10/27/20 Time HH Agency Contacted: 1510 Representative spoke with at [facility] Agency: Amy  Prior Living Arrangements/Services Living arrangements for the past 2 months: Single Family Home Lives with:: Adult Children Patient language and need for interpreter reviewed:: Yes Do you feel safe going back to the place where you live?: Yes      Need for Family Participation in Patient Care: Yes (Comment) Care giver support system in place?:  Yes (comment)   Criminal Activity/Legal Involvement Pertinent to Current Situation/Hospitalization: No - Comment as needed  Activities of Daily Living Home Assistive Devices/Equipment: None ADL Screening (condition at time of admission) Patient's cognitive ability adequate to safely complete daily activities?: Yes Is the patient deaf or have difficulty hearing?: No Does the patient have difficulty seeing, even when wearing glasses/contacts?: No Does the patient have difficulty concentrating, remembering, or making decisions?: No Patient able to express need for assistance with ADLs?: No Does the patient have difficulty dressing or bathing?: No Independently performs ADLs?: Yes (appropriate for developmental age) Does the patient have difficulty walking or climbing stairs?: No Weakness of Legs: None Weakness of Arms/Hands: None  Permission Sought/Granted   Permission granted to share information with : Yes, Verbal Permission Granted  Share Information with NAME: Jarid Sasso (Daughter)  [phone]           Emotional Assessment Appearance:: Appears stated age Attitude/Demeanor/Rapport: Engaged Affect (typically observed): Accepting Orientation: : Oriented to Self,Oriented to Place,Oriented to  Time,Oriented to Situation Alcohol / Substance Use: Not Applicable Psych Involvement: No (comment)  Admission diagnosis:  Osteomyelitis (HCC) [M86.9] Open toe wound, initial encounter [S91.109A] Other acute osteomyelitis of left foot [facility]) [J24.268] Patient Active Problem List   Diagnosis Date Noted  . Osteomyelitis of great toe of left foot (HCC) 10/23/2020  . Type 2 diabetes mellitus with hyperglycemia (HCC) 10/23/2020  . Hypercholesteremia   . Normocytic anemia   . Hypokalemia   .  Elevated serum creatinine   . Mild protein malnutrition (HCC)   . Diabetes mellitus type II, uncontrolled (HCC) 03/01/2015  . DM2 (diabetes mellitus, type 2) (HCC) 02/26/2015  . HTN (hypertension)  02/26/2015  . Perforated appendicitis 02/24/2015   PCP:  Clinic, [facility] Pharmacy:   [facility] [place] [place] - [address] [place] [address] Phone: [phone] Fax: [phone]     Social Determinants of Health (SDOH) Interventions    Readmission Risk Interventions No flowsheet data found.

## 2020-10-27 NOTE — Plan of Care (Signed)
Patient is s/p left great toe amputation by Dr. Susa Simmonds on 3/6. ACE wrap to left foot clean dry and intact. NV checks WNL. IV and PO antibx are given as ordered and patient is not complaining of any pain. NAD or needs voiced. Will continue to monitor and continue current POC.

## 2020-10-27 NOTE — Evaluation (Signed)
Occupational Therapy Evaluation Patient Details Name: Richard Russell MRN: [id number] DOB: 1951-04-19 Today's Date: 10/27/2020    History of Present Illness Richard Russell is a 71 y.o. male who is sent from [facility] after presenting there with 1 month history of left toe injury, which turned into an ulcer on 10/23/2020. Pt underwnet L great toe amputation on 3/1.  PMH: hypercholesterolemia, hypertension, type 2 diabetes mellitus with renal manifestations, diabetic peripheral neuropathy, class I obesity, history of back surgery with occasional back pain exacerbation.   Clinical Impression   Pt presents with decline in function and safety with ADLs and ADL mobility with impaired balance and endurance. PTA pt lives at home with his daughter and was Ind with ADLs/selfcare and mobility with no AD. Pt currently requires min A with ADLs and mobility using RW. Pt would benefit from acute OT services to address impairments to maximize level of function and safety    Follow Up Recommendations  Supervision - Intermittent;No OT follow up    Equipment Recommendations  Other (comment);3 in 1 bedside commode (RW)    Recommendations for Other Services       Precautions / Restrictions Precautions Precautions: Fall Required Braces or Orthoses: Other Brace Other Brace: Darco Shoe Restrictions Weight Bearing Restrictions: Yes LLE Weight Bearing: Non weight bearing (WB though heel in Darco Shoe) Other Position/Activity Restrictions: Darco shoe, WB through heel      Mobility Bed Mobility Overal bed mobility: Modified Independent             General bed mobility comments: used bed rail to pull self up, HOB slightly elevated    Transfers Overall transfer level: Needs assistance Equipment used: Rolling walker (2 wheeled) Transfers: Sit to/from Stand Sit to Stand: Min assist         General transfer comment: minA to power up and steady during transition of hands from bed to walker, pt able to  maintain heel weightbearing. Pt walked x 3 ft towardas bathrooom and then requeste d to sit back on EOB    Balance Overall balance assessment: Needs assistance Sitting-balance support: Feet supported;No upper extremity supported Sitting balance-Leahy Scale: Good     Standing balance support: During functional activity;Bilateral upper extremity supported Standing balance-Leahy Scale: Poor Standing balance comment: requires RW for safe standing and ambulation to maintain L LE WBing through heel only                           ADL either performed or assessed with clinical judgement   ADL Overall ADL's : Needs assistance/impaired Eating/Feeding: Independent;Sitting   Grooming: Wash/dry hands;Wash/dry face;Min guard;Standing Grooming Details (indicate cue type and reason): standing at EOB with RW Upper Body Bathing: Set up;Independent;Sitting   Lower Body Bathing: Minimal assistance   Upper Body Dressing : Set up;Independent;Sitting   Lower Body Dressing: Minimal assistance   Toilet Transfer: Minimal assistance;Ambulation;RW   Toileting- Architect and Hygiene: Min guard;Sit to/from stand         General ADL Comments: pt educated on compensatory LB ADL techniques     Vision Patient Visual Report: No change from baseline       Perception     Praxis      Pertinent Vitals/Pain Pain Assessment: 0-10 Pain Score: 5  Pain Location: L foot at amputation sight Pain Descriptors / Indicators: Discomfort Pain Intervention(s): Monitored during session;Repositioned     Hand Dominance Right   Extremity/Trunk Assessment Upper Extremity Assessment Upper Extremity  Assessment: Overall WFL for tasks assessed   Lower Extremity Assessment Lower Extremity Assessment: Defer to PT evaluation RLE Deficits / Details: pt with knee pain limiting ability to resist MMT, limited wbing tolerance due to pain LLE Deficits / Details: limited ankle ROM due to amputation  otherwise [facility]   Cervical / Trunk Assessment Cervical / Trunk Assessment: Normal   Communication Communication Communication: No difficulties   Cognition Arousal/Alertness: Awake/alert Behavior During Therapy: WFL for tasks assessed/performed Overall Cognitive Status: Within Functional Limits for tasks assessed                                     General Comments  L foot in ace wrap, no drainage noted    Exercises     Shoulder Instructions      Home Living Family/patient expects to be discharged to:: Private residence Living Arrangements: Children Available Help at Discharge: Available PRN/intermittently Type of Home: House Home Access: Level entry     Home Layout: One level     Bathroom Shower/Tub: Chief Strategy Officer: Standard     Home Equipment: None          Prior Functioning/Environment Level of Independence: Independent  Gait / Transfers Assistance Needed: reports going to the gym 4-5days/wk ADL's / Homemaking Assistance Needed: Ind with ADLs/selfcare   Comments: pt reports that he was going to the gym 4-5 days/wk        OT Problem List: Impaired balance (sitting and/or standing);Pain;Decreased activity tolerance;Decreased knowledge of use of DME or AE      OT Treatment/Interventions: Self-care/ADL training;Patient/family education;Therapeutic activities;DME and/or AE instruction    OT Goals(Current goals can be found in the care plan section) Acute Rehab OT Goals Patient Stated Goal: get home OT Goal Formulation: With patient Time For Goal Achievement: 11/10/20 Potential to Achieve Goals: Good ADL Goals Pt Will Perform Grooming: with supervision;with set-up;with modified independence;standing Pt Will Perform Lower Body Bathing: with min guard assist;with supervision;with modified independence;sitting/lateral leans;sit to/from stand Pt Will Perform Lower Body Dressing: with min guard assist;with supervision;with  modified independence;sit to/from stand Pt Will Transfer to Toilet: with min guard assist;with modified independence;ambulating Pt Will Perform Toileting - Clothing Manipulation and hygiene: with supervision;with modified independence;sit to/from stand  OT Frequency: Min 2X/week   Barriers to D/C:            Co-evaluation              AM-PAC OT "6 Clicks" Daily Activity     Outcome Measure Help from another person eating meals?: None Help from another person taking care of personal grooming?: A Little Help from another person toileting, which includes using toliet, bedpan, or urinal?: A Little Help from another person bathing (including washing, rinsing, drying)?: A Little Help from another person to put on and taking off regular upper body clothing?: None Help from another person to put on and taking off regular lower body clothing?: A Little 6 Click Score: 20   End of Session Equipment Utilized During Treatment: Gait belt;Rolling walker;Other (comment) (L Darco Shoe) Nurse Communication: Mobility status  Activity Tolerance: Patient tolerated treatment well Patient left: in bed;Other (comment) (sitting EOB)  OT Visit Diagnosis: Unsteadiness on feet (R26.81);Other abnormalities of gait and mobility (R26.89);Pain Pain - Right/Left: Left Pain - part of body: Ankle and joints of foot  Time: 3729-0211 OT Time Calculation (min): 24 min Charges:  OT General Charges $OT Visit: 1 Visit    Galen Manila 10/27/2020, 1:22 PM

## 2020-10-28 DIAGNOSIS — M869 Osteomyelitis, unspecified: Secondary | ICD-10-CM | POA: Diagnosis not present

## 2020-10-28 LAB — CBC
HCT: 30.9 % — ABNORMAL LOW (ref 39.0–52.0)
Hemoglobin: 10.8 g/dL — ABNORMAL LOW (ref 13.0–17.0)
MCH: 29.1 pg (ref 26.0–34.0)
MCHC: 35 g/dL (ref 30.0–36.0)
MCV: 83.3 fL (ref 80.0–100.0)
Platelets: 430 10*3/uL — ABNORMAL HIGH (ref 150–400)
RBC: 3.71 MIL/uL — ABNORMAL LOW (ref 4.22–5.81)
RDW: 11.9 % (ref 11.5–15.5)
WBC: 15 10*3/uL — ABNORMAL HIGH (ref 4.0–10.5)
nRBC: 0 % (ref 0.0–0.2)

## 2020-10-28 LAB — BASIC METABOLIC PANEL
Anion gap: 11 (ref 5–15)
BUN: 20 mg/dL (ref 8–23)
CO2: 23 mmol/L (ref 22–32)
Calcium: 8.1 mg/dL — ABNORMAL LOW (ref 8.9–10.3)
Chloride: 96 mmol/L — ABNORMAL LOW (ref 98–111)
Creatinine, Ser: 1.44 mg/dL — ABNORMAL HIGH (ref 0.61–1.24)
GFR, Estimated: 53 mL/min — ABNORMAL LOW (ref >60)
Glucose, Bld: 218 mg/dL — ABNORMAL HIGH (ref 70–99)
Potassium: 3.6 mmol/L (ref 3.5–5.1)
Sodium: 130 mmol/L — ABNORMAL LOW (ref 135–145)

## 2020-10-28 LAB — GLUCOSE, CAPILLARY
Glucose-Capillary: 184 mg/dL — ABNORMAL HIGH (ref 70–99)
Glucose-Capillary: 290 mg/dL — ABNORMAL HIGH (ref 70–99)
Glucose-Capillary: 293 mg/dL — ABNORMAL HIGH (ref 70–99)
Glucose-Capillary: 215 mg/dL — ABNORMAL HIGH (ref 70–99)

## 2020-10-28 MED ORDER — ENALAPRIL MALEATE 5 MG PO TABS
20.0000 mg | ORAL_TABLET | Freq: Two times a day (BID) | ORAL | Status: DC
  Administered 2020-10-28 – 2020-10-29 (×3): 20 mg via ORAL
  Filled 2020-10-28: qty 1
  Filled 2020-10-28: qty 4
  Filled 2020-10-28: qty 1
  Filled 2020-10-28: qty 4
  Filled 2020-10-28: qty 1
  Filled 2020-10-28 (×2): qty 4

## 2020-10-28 MED ORDER — INSULIN DETEMIR 100 UNIT/ML ~~LOC~~ SOLN
40.0000 [IU] | Freq: Two times a day (BID) | SUBCUTANEOUS | Status: DC
  Administered 2020-10-28 – 2020-10-30 (×4): 40 [IU] via SUBCUTANEOUS
  Filled 2020-10-28 (×5): qty 0.4

## 2020-10-28 MED ORDER — POLYETHYLENE GLYCOL 3350 17 G PO PACK
17.0000 g | PACK | Freq: Every day | ORAL | Status: DC
  Administered 2020-10-28 – 2020-10-31 (×4): 17 g via ORAL
  Filled 2020-10-28 (×4): qty 1

## 2020-10-28 MED ORDER — FLUTICASONE PROPIONATE 50 MCG/ACT NA SUSP
2.0000 | Freq: Every day | NASAL | Status: DC
  Administered 2020-10-28 – 2020-10-31 (×4): 2 via NASAL
  Filled 2020-10-28: qty 16

## 2020-10-28 MED ORDER — DICLOFENAC SODIUM 1 % EX GEL
2.0000 g | Freq: Three times a day (TID) | CUTANEOUS | Status: DC
  Administered 2020-10-28 – 2020-10-31 (×10): 2 g via TOPICAL
  Filled 2020-10-28 (×2): qty 100

## 2020-10-28 MED ORDER — INSULIN ASPART 100 UNIT/ML ~~LOC~~ SOLN
10.0000 [IU] | Freq: Three times a day (TID) | SUBCUTANEOUS | Status: DC
  Administered 2020-10-28: 10 [IU] via SUBCUTANEOUS

## 2020-10-28 MED ORDER — SENNOSIDES-DOCUSATE SODIUM 8.6-50 MG PO TABS
1.0000 | ORAL_TABLET | Freq: Two times a day (BID) | ORAL | Status: DC
  Administered 2020-10-28 – 2020-10-31 (×6): 1 via ORAL
  Filled 2020-10-28 (×7): qty 1

## 2020-10-28 NOTE — Progress Notes (Signed)
Physical Therapy Treatment Patient Details Name: Richard Russell MRN: [id number] DOB: 28-Apr-1951 Today's Date: 10/28/2020    History of Present Illness Richard Russell is a 70 y.o. male who is sent from [facility] after presenting there with 1 month history of left toe injury, which turned into an ulcer on 10/23/2020. Pt underwnet L great toe amputation on 3/1.  PMH: hypercholesterolemia, hypertension, type 2 diabetes mellitus with renal manifestations, diabetic peripheral neuropathy, class I obesity, history of back surgery with occasional back pain exacerbation.    PT Comments    Pt supine in bed this session.  He required a bit of encouragement to participate but very pleasant throughout session.  Pt reports new complaints of increased pain in R knee that he reports as worse then his L foot.  Pt noted with extensor Lag on R side during exercises in supine.  Continue to recommend HHPT with support from his family.     Follow Up Recommendations  Home health PT;Supervision/Assistance - 24 hour     Equipment Recommendations  Rolling walker with 5" wheels    Recommendations for Other Services       Precautions / Restrictions Precautions Precautions: Fall Other Brace: Darco Shoe Restrictions Weight Bearing Restrictions: Yes LLE Weight Bearing: Non weight bearing Other Position/Activity Restrictions: Darco shoe, WB through heel    Mobility  Bed Mobility Overal bed mobility: Modified Independent             General bed mobility comments: used bed rail to pull self up, HOB slightly elevated.  Heavy rail use.    Transfers Overall transfer level: Needs assistance Equipment used: Rolling walker (2 wheeled) Transfers: Sit to/from Stand Sit to Stand: Min assist         General transfer comment: Cues for hand placement to and from seated surface this session.  Performed x 2 reps this session.  Ambulation/Gait Ambulation/Gait assistance: Min assist Gait Distance (Feet): 20  Feet Assistive device: Rolling walker (2 wheeled)   Gait velocity: slow   General Gait Details: VCs for sequencing remain.  Pt with R knee pain and weakness noted.  Pt fatigues quickly with increased WOB.   Stairs             Wheelchair Mobility    Modified Rankin (Stroke Patients Only)       Balance Overall balance assessment: Needs assistance Sitting-balance support: Feet supported;No upper extremity supported Sitting balance-Leahy Scale: Good       Standing balance-Leahy Scale: Poor                              Cognition Arousal/Alertness: Awake/alert Behavior During Therapy: WFL for tasks assessed/performed Overall Cognitive Status: Within Functional Limits for tasks assessed                                        Exercises General Exercises - Lower Extremity Ankle Circles/Pumps: AROM;Both;10 reps;Supine Quad Sets: AROM;Both;10 reps;Supine Heel Slides: AROM;Both;10 reps;Supine Hip ABduction/ADduction: AROM;Both;10 reps;Supine    General Comments        Pertinent Vitals/Pain Pain Assessment: 0-10 Pain Score: 5  (reports 8/10 in R knee) Pain Location: L foot at amputation sight Pain Descriptors / Indicators: Discomfort Pain Intervention(s): Monitored during session;Repositioned    Home Living  Prior Function            PT Goals (current goals can now be found in the care plan section) Acute Rehab PT Goals Patient Stated Goal: get home Potential to Achieve Goals: Good Progress towards PT goals: Progressing toward goals    Frequency    Min 4X/week      PT Plan Current plan remains appropriate    Co-evaluation              AM-PAC PT "6 Clicks" Mobility   Outcome Measure  Help needed turning from your back to your side while in a flat bed without using bedrails?: None Help needed moving from lying on your back to sitting on the side of a flat bed without using bedrails?:  None Help needed moving to and from a bed to a chair (including a wheelchair)?: A Little Help needed standing up from a chair using your arms (e.g., wheelchair or bedside chair)?: A Little Help needed to walk in hospital room?: A Little Help needed climbing 3-5 steps with a railing? : A Lot 6 Click Score: 19    End of Session Equipment Utilized During Treatment: Gait belt (L darco shoe) Activity Tolerance: Patient tolerated treatment well Patient left: in chair;with call bell/phone within reach Nurse Communication: Mobility status PT Visit Diagnosis: Unsteadiness on feet (R26.81);Muscle weakness (generalized) (M62.81);Difficulty in walking, not elsewhere classified (R26.2)     Time: 9449-6759 PT Time Calculation (min) (ACUTE ONLY): 20 min  Charges:  $Gait Training: 8-22 mins                     Bonney Leitz , PTA Acute Rehabilitation Services Pager [phone] Office [phone]     Richard Russell 10/28/2020, 3:25 PM

## 2020-10-28 NOTE — Progress Notes (Addendum)
Nutrition Follow-up  DOCUMENTATION CODES:   Obesity unspecified  INTERVENTION:   -Continue 30 ml Prosource Plus daily -Continue MVI with minerals daily -Continue Ensure Max po BID, each supplement provides 150 kcal and 30 grams of protein -Magic cup TID with meals, each supplement provides 290 kcal and 9 grams of protein  NUTRITION DIAGNOSIS:   Increased nutrient needs related to wound healing as evidenced by estimated needs.  Ongoing  GOAL:   Patient will meet greater than or equal to 90% of their needs  Progressing   MONITOR:   PO intake,Supplement acceptance,Labs,Skin  REASON FOR ASSESSMENT:   Consult Wound healing  ASSESSMENT:   70 yo male admitted with wound to great toe on left foot. PMH includes hypercholesterolemia, HTN, DM-2.  3/4- MRI of lt foot revealed osteomyelitis throughout the distal phalanx of the great toe 3/6- s/p Procedure(s): Left Hallux Partial Amputation (Left)  Irrigation and exsisional debridement left forefoot abscess  Reviewed I/O's: -320 ml x 24 hours and +462 ml since admission  UOP: 1.4 L x 24 hours  Pt sleeping soundly at time of visit. He did not respond to voice or touch.   Pt with improving appetite. Noted meal completion 25-90%. Observed breakfast tray- pt consumed about 75% of meal. Pt also consuming about 50% of Ensure Max supplements.   Medications reviewed and include miralax and senna.   Labs reviewed: Na: 130, CBGS: 184-289 (inpatient orders for glycemic control are 0-15 units insulin aspart TID with meals, 7 units insulin aspart TID, and 37 units insulin detemir BID).   NUTRITION - FOCUSED PHYSICAL EXAM:  Flowsheet Row Most Recent Value  Orbital Region No depletion  Upper Arm Region No depletion  Thoracic and Lumbar Region No depletion  Buccal Region No depletion  Temple Region No depletion  Clavicle Bone Region No depletion  Clavicle and Acromion Bone Region No depletion  Scapular Bone Region No depletion   Dorsal Hand No depletion  Patellar Region No depletion  Anterior Thigh Region No depletion  Posterior Calf Region No depletion  Edema (RD Assessment) Mild  Hair Reviewed  Eyes Reviewed  Mouth Reviewed  Skin Reviewed  Nails Reviewed       Diet Order:   Diet Order            Diet heart healthy/carb modified Room service appropriate? Yes; Fluid consistency: Thin  Diet effective now                 EDUCATION NEEDS:   Not appropriate for education at this time  Skin:  Skin Assessment: Skin Integrity Issues: Skin Integrity Issues:: Diabetic Ulcer Diabetic Ulcer: Left great toe  Last BM:  3/4  Height:   Ht Readings from Last 1 Encounters:  10/25/20 6' (1.829 m)    Weight:   Wt Readings from Last 1 Encounters:  10/25/20 112 kg    Ideal Body Weight:  80.9 kg  BMI:  Body mass index is 33.5 kg/m.  Estimated Nutritional Needs:   Kcal:  2300-2500  Protein:  140-160 gm  Fluid:  >/= 2.3 L    Levada Schilling, RD, LDN, CDCES Registered Dietitian II Certified Diabetes Care and Education Specialist Please refer to [facility] for RD and/or RD on-call/weekend/after hours pager

## 2020-10-28 NOTE — Progress Notes (Signed)
Occupational Therapy Treatment Patient Details Name: Richard Russell MRN: [id number] DOB: 1950-09-19 Today's Date: 10/28/2020    History of present illness Richard Russell is a 70 y.o. male who is sent from [facility] after presenting there with 1 month history of left toe injury, which turned into an ulcer on 10/23/2020. Pt underwnet L great toe amputation on 3/1.  PMH: hypercholesterolemia, hypertension, type 2 diabetes mellitus with renal manifestations, diabetic peripheral neuropathy, class I obesity, history of back surgery with occasional back pain exacerbation.   OT comments  Pt I recliner upon arrival; limited by R knee pain (RN aware). Pt agreeable to stand at [facility] for SPT to [facility] and then back to recliner. Pt stated " I will do more tomorrow". OT will continue to follow acutely to maximize level of function and safety  Follow Up Recommendations  Supervision - Intermittent;No OT follow up    Equipment Recommendations  Other (comment);3 in 1 bedside commode    Recommendations for Other Services      Precautions / Restrictions Precautions Precautions: Fall Required Braces or Orthoses: Other Brace Other Brace: Darco Shoe Restrictions Weight Bearing Restrictions: Yes LLE Weight Bearing: Weight bearing as tolerated Other Position/Activity Restrictions: Darco shoe, WB through heel       Mobility Bed Mobility Overal bed mobility: Modified Independent             General bed mobility comments: pt in recliner upon arrival    Transfers Overall transfer level: Modified independent Equipment used: Rolling walker (2 wheeled) Transfers: Sit to/from Stand Sit to Stand: Mod assist;Min assist         General transfer comment: Cues for hand placement to and from seated surface this session.  limited by R knee pain, RN aware    Balance Overall balance assessment: Needs assistance Sitting-balance support: Feet supported;No upper extremity supported Sitting balance-Leahy Scale: Good      Standing balance support: During functional activity;Bilateral upper extremity supported Standing balance-Leahy Scale: Poor                             ADL either performed or assessed with clinical judgement   ADL       Grooming: Wash/dry hands;Wash/dry face;Standing;Supervision/safety;Set up                   Toilet Transfer: Moderate assistance;Minimal assistance;Stand-pivot;RW;BSC                   Vision Patient Visual Report: No change from baseline     Perception     Praxis      Cognition Arousal/Alertness: Awake/alert Behavior During Therapy: WFL for tasks assessed/performed Overall Cognitive Status: Within Functional Limits for tasks assessed                                          Exercises General Exercises - Lower Extremity Ankle Circles/Pumps: AROM;Both;10 reps;Supine Quad Sets: AROM;Both;10 reps;Supine Heel Slides: AROM;Both;10 reps;Supine Hip ABduction/ADduction: AROM;Both;10 reps;Supine   Shoulder Instructions       General Comments      Pertinent Vitals/ Pain       Pain Assessment: 0-10 Pain Score: 7  Pain Location: L foot at amputation sight Pain Descriptors / Indicators: Discomfort Pain Intervention(s): Premedicated before session;Limited activity within patient's tolerance;Monitored during session;Repositioned  Home Living  Prior Functioning/Environment              Frequency  Min 2X/week        Progress Toward Goals  OT Goals(current goals can now be found in the care plan section)  Progress towards OT goals: Progressing toward goals  Acute Rehab OT Goals Patient Stated Goal: get home  Plan Discharge plan remains appropriate    Co-evaluation                 AM-PAC OT "6 Clicks" Daily Activity     Outcome Measure   Help from another person eating meals?: None Help from another person taking care of  personal grooming?: A Little Help from another person toileting, which includes using toliet, bedpan, or urinal?: A Little Help from another person bathing (including washing, rinsing, drying)?: A Little Help from another person to put on and taking off regular upper body clothing?: None Help from another person to put on and taking off regular lower body clothing?: A Little 6 Click Score: 20    End of Session Equipment Utilized During Treatment: Gait belt;Rolling walker;Other (comment) (L Darco Shoe, BSC)  OT Visit Diagnosis: Unsteadiness on feet (R26.81);Other abnormalities of gait and mobility (R26.89);Pain Pain - part of body: Ankle and joints of foot (L Foot, R knee)   Activity Tolerance Patient limited by pain   Patient Left in chair;with call bell/phone within reach   Nurse Communication          Time: 1610-9604 OT Time Calculation (min): 13 min  Charges: OT General Charges $OT Visit: 1 Visit OT Treatments $Therapeutic Activity: 8-22 mins     Galen Manila 10/28/2020, 5:13 PM

## 2020-10-28 NOTE — Care Management Important Message (Signed)
Important Message  Patient Details  Name: Richard Russell MRN: [id number] Date of Birth: [birth year]   Medicare Important Message Given:  Yes     Oralia Rud Griffin Gerrard 10/28/2020, 2:21 PM

## 2020-10-28 NOTE — Progress Notes (Signed)
Inpatient Diabetes Program Recommendations  AACE/ADA: New Consensus Statement on Inpatient Glycemic Control   Target Ranges:  Prepandial:   less than 140 mg/dL      Peak postprandial:   less than 180 mg/dL (1-2 hours)      Critically ill patients:  140 - 180 mg/dL  Results for MCCRAE, SPECIALE" (MRN [id number]) as of 10/28/2020 11:46  Ref. Range 10/27/2020 06:51 10/27/2020 11:32 10/27/2020 17:09 10/27/2020 21:36 10/28/2020 06:30  Glucose-Capillary Latest Ref Range: 70 - 99 mg/dL 818 (H)  Novolog 5 units  Levemir 37 units 259 (H)  Novolog 15 units 289 (H)  Novolog 15 units 247 (H)     Levemir 37 units 184 (H)  Novolog 10 units  Levemir 37 units     Review of Glycemic Control  Diabetes history: DM2 Outpatient Diabetes medications: 70/30 60 units BID Current orders for Inpatient glycemic control: Levemir 37 units BID, Novolog 7 units TID with meals, Novolog 0-15 units TID with meals  Inpatient Diabetes Program Recommendations:    Insulin: Please consider increasing Levemir to 39 units BID and meal coverage to Novolog 14 units TID with meals.  Thanks, Orlando Penner, RN, MSN, CDE Diabetes Coordinator Inpatient Diabetes Program [phone] (Team Pager from 8am to 5pm)

## 2020-10-28 NOTE — Plan of Care (Signed)

## 2020-10-28 NOTE — Progress Notes (Signed)
[facility] Progress Note  Patient: Richard Russell    ZDG:[id number]  DOA: 10/23/2020     Date of Service: the patient was seen and examined on 10/28/2020  Brief hospital course: Past medical history of HLD, HTN, type II DM, neuropathy, obesity.  Presents with 1 month history of left great toe injury with infection found to have osteomyelitis of the great toe has been amputation on 3/6. Currently plan is follow-up on cultures for sensitivity.  Assessment and Plan: 1.  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 Lucia Gaskins Polymicrobial septic arthritis Admitted on 3/4 with fever and leukocytosis.  ESR 1.7 CRP 34.  Started on IV antibiotics. Orthopedic surgery consulted underwent left hallux MTP disarticulation, forefoot abscess incision and drainage. Deep cultures growing Proteus, staph aureus, group G strep and diphtheroids. Leukocytosis still persistent. Blood cultures negative. Currently on IV cefepime and Flagyl.  Vancomycin was discontinued on 3/8. MRSA PCR negative. Pain remains well controlled.  Will monitor cultures for sensitivity for staph aureus. He is weightbearing in postop shoes, keep dressing in place, follow-up in 2 weeks for wound check, sutures will remain in place for 4 weeks. Antibiotic therapy to treat cellulitis for complete eradication. Patient reports right knee pain.  Currently conservative measures.  If does not improve will initiate further work-up to rule out acute infection there as well.  2.  Type 2 diabetes mellitus, uncontrolled with hyperglycemia with diabetic neuropathy, long-term insulin use Home regimen includes 70 3060 units twice daily. Currently on Levemir 40 units twice daily plus aspart 10 units 3 times daily and sliding scale insulin. Hemoglobin A1c 10+. Monitor. Currently also on gabapentin which is continued. Resume enalapril 20 mg twice daily.  3.  Accelerated hypertension Blood  pressure elevated. Continue clonidine patch, Norvasc, hydralazine and resuming Vasotec home regimen.  4.  Constipation. Initiate a bowel regimen.  Monitor.  5.  Hyperlipidemia Continue Lipitor.  6.  Obesity Placing the patient at high risk for poor outcomes especially in setting of weightbearing and amputation  Body mass index is 33.5 kg/m.  Nutrition Problem: Increased nutrient needs Etiology: wound healing Interventions: Interventions: MVI,Refer to RD note for recommendations      Diet: Carb modified diet DVT Prophylaxis:   Place TED hose Start: 10/28/20 1237    Advance goals of care discussion: Full code  Family Communication: no family was present at bedside, at the time of interview.   Disposition:  Status is: Inpatient  Remains inpatient appropriate because:IV treatments appropriate due to intensity of illness or inability to take PO   Dispo:  Patient From: Home  Planned Disposition: Home with Health Care Svc  Medically stable for discharge: No          Subjective: No nausea no vomiting.  No fever no chills.  Denied any pain to me but later on reported to PT as well as RN that he has pain in his right knee.  No diarrhea but reports constipation.  Passing gas.  Minimal oral intake.  Reports difficulty with sleeping.  Physical Exam:  General: Appear in mild distress, no Rash; Oral Mucosa Clear, moist. no Abnormal Neck Mass Or lumps, Conjunctiva normal  Cardiovascular: S1 and S2 Present, no Murmur, Respiratory: good respiratory effort, Bilateral Air entry present and CTA, no Crackles, no wheezes Abdomen: Bowel Sound present, Soft and no tenderness Extremities: no Pedal edema Neurology: alert and oriented to time, place, and person affect appropriate. no new focal deficit  Gait not checked due to patient safety concerns  Vitals:   10/28/20 0700 10/28/20 0745 10/28/20 1448 10/28/20 1624  BP: (!) 189/91 (!) 178/78 (!) 184/89 (!) 174/80  Pulse: 98 93 89 93   Resp:  15 16   Temp:  99.1 F (37.3 C) 98.5 F (36.9 C)   TempSrc:  Oral Oral   SpO2: 96% 95% 98%   Weight:      Height:        Intake/Output Summary (Last 24 hours) at 10/28/2020 1811 Last data filed at 10/28/2020 1700 Gross per 24 hour  Intake 940.33 ml  Output 2440 ml  Net -1499.67 ml   Filed Weights   10/23/20 0934 10/25/20 0820  Weight: 112 kg 112 kg    Data Reviewed: I have personally reviewed and interpreted daily labs, tele strips, imaging. I reviewed all nursing notes, pharmacy notes, vitals, pertinent old records I have discussed plan of care as described above with RN and patient/family.  CBC: Recent Labs  Lab 10/23/20 1023 10/23/20 1950 10/24/20 0201 10/25/20 0115 10/26/20 0248 10/27/20 0134 10/28/20 0239  WBC 15.9* 17.7* 14.1* 15.8* 15.1* 15.8* 15.0*  NEUTROABS 12.3* 13.4*  --   --   --   --   --   HGB 11.7* 12.0* 11.3* 10.5* 10.6* 10.7* 10.8*  HCT 32.9* 34.4* 30.0* 28.4* 28.9* 29.2* 30.9*  MCV 82.9 83.3 80.9 81.4 82.1 81.8 83.3  PLT 347 348 337 366 384 399 902*   Basic Metabolic Panel: Recent Labs  Lab 10/24/20 0201 10/25/20 0115 10/26/20 0248 10/27/20 0134 10/28/20 0239  NA 128* 130* 133* 131* 130*  K 3.3* 3.3* 3.9 3.5 3.6  CL 91* 93* 99 97* 96*  CO2 _0 GLUCOSE 378* 317* 250* 244* 218*  BUN _1 CREATININE 1.82* 1.84* 1.82* 1.60* 1.44*  CALCIUM 8.1* 8.0* 8.1* 8.0* 8.1*  MG  --  1.9  --   --   --   PHOS  --  2.4*  --   --   --     Studies: No results found.  Scheduled Meds: . (feeding supplement) PROSource Plus  30 mL Oral Daily  . amLODipine  10 mg Oral Daily  . atorvastatin  40 mg Oral Daily  . cloNIDine  0.2 mg Transdermal Weekly  . diclofenac Sodium  2 g Topical TID  . enalapril  20 mg Oral BID  . enoxaparin (LOVENOX) injection  55 mg Subcutaneous QHS  . fluticasone  2 spray Each Nare Daily  . gabapentin  800 mg Oral TID  . hydrALAZINE  10 mg Oral Q8H  . insulin aspart  0-15 Units Subcutaneous TID WC   . insulin aspart  10 Units Subcutaneous TID WC  . insulin detemir  40 Units Subcutaneous BID  . methocarbamol  500 mg Oral BID  . metroNIDAZOLE  500 mg Oral Q8H  . multivitamin with minerals  1 tablet Oral Daily  . polyethylene glycol  17 g Oral Daily  . Ensure Max Protein  11 oz Oral BID  . senna-docusate  1 tablet Oral BID   Continuous Infusions: . ceFEPime (MAXIPIME) IV Stopped (10/28/20 0905)  . lactated ringers     PRN Meds: acetaminophen **OR** acetaminophen, hydrALAZINE, oxyCODONE, prochlorperazine  Time spent: 35 minutes  Author: Berle Mull, MD [facility] 10/28/2020 6:11 PM  To reach On-call, see care teams to locate the attending and reach out via [email or web address]. Between 7PM-7AM, please contact night-coverage If  you still have difficulty reaching the attending provider, please page the [facility] (Director on Call) for [facility] on amion for assistance.

## 2020-10-29 ENCOUNTER — Inpatient Hospital Stay (HOSPITAL_COMMUNITY): Payer: Medicare Other

## 2020-10-29 ENCOUNTER — Encounter (HOSPITAL_COMMUNITY): Payer: Medicare Other

## 2020-10-29 DIAGNOSIS — M869 Osteomyelitis, unspecified: Secondary | ICD-10-CM | POA: Diagnosis not present

## 2020-10-29 DIAGNOSIS — R609 Edema, unspecified: Secondary | ICD-10-CM | POA: Diagnosis not present

## 2020-10-29 LAB — GLUCOSE, CAPILLARY
Glucose-Capillary: 246 mg/dL — ABNORMAL HIGH (ref 70–99)
Glucose-Capillary: 383 mg/dL — ABNORMAL HIGH (ref 70–99)
Glucose-Capillary: 341 mg/dL — ABNORMAL HIGH (ref 70–99)
Glucose-Capillary: 164 mg/dL — ABNORMAL HIGH (ref 70–99)

## 2020-10-29 LAB — CBC WITH DIFFERENTIAL/PLATELET
Abs Immature Granulocytes: 0.1 10*3/uL — ABNORMAL HIGH (ref 0.00–0.07)
Basophils Absolute: 0 10*3/uL (ref 0.0–0.1)
Basophils Relative: 0 %
Eosinophils Absolute: 0.1 10*3/uL (ref 0.0–0.5)
Eosinophils Relative: 1 %
HCT: 29.6 % — ABNORMAL LOW (ref 39.0–52.0)
Hemoglobin: 10.7 g/dL — ABNORMAL LOW (ref 13.0–17.0)
Immature Granulocytes: 1 %
Lymphocytes Relative: 16 %
Lymphs Abs: 1.6 10*3/uL (ref 0.7–4.0)
MCH: 29.7 pg (ref 26.0–34.0)
MCHC: 36.1 g/dL — ABNORMAL HIGH (ref 30.0–36.0)
MCV: 82.2 fL (ref 80.0–100.0)
Monocytes Absolute: 1.1 10*3/uL — ABNORMAL HIGH (ref 0.1–1.0)
Monocytes Relative: 10 %
Neutro Abs: 7.4 10*3/uL (ref 1.7–7.7)
Neutrophils Relative %: 72 %
Platelets: 456 10*3/uL — ABNORMAL HIGH (ref 150–400)
RBC: 3.6 MIL/uL — ABNORMAL LOW (ref 4.22–5.81)
RDW: 11.9 % (ref 11.5–15.5)
WBC: 10.3 10*3/uL (ref 4.0–10.5)
nRBC: 0 % (ref 0.0–0.2)

## 2020-10-29 LAB — CULTURE, BLOOD (ROUTINE X 2)
Culture: NO GROWTH
Culture: NO GROWTH
Special Requests: ADEQUATE
Special Requests: ADEQUATE

## 2020-10-29 LAB — SEDIMENTATION RATE: Sed Rate: >140 mm/hr — ABNORMAL HIGH (ref 0–16)

## 2020-10-29 LAB — BASIC METABOLIC PANEL
Anion gap: 10 (ref 5–15)
BUN: 24 mg/dL — ABNORMAL HIGH (ref 8–23)
CO2: 24 mmol/L (ref 22–32)
Calcium: 8.1 mg/dL — ABNORMAL LOW (ref 8.9–10.3)
Chloride: 95 mmol/L — ABNORMAL LOW (ref 98–111)
Creatinine, Ser: 1.5 mg/dL — ABNORMAL HIGH (ref 0.61–1.24)
GFR, Estimated: 51 mL/min — ABNORMAL LOW (ref >60)
Glucose, Bld: 382 mg/dL — ABNORMAL HIGH (ref 70–99)
Potassium: 3.9 mmol/L (ref 3.5–5.1)
Sodium: 129 mmol/L — ABNORMAL LOW (ref 135–145)

## 2020-10-29 LAB — C-REACTIVE PROTEIN: CRP: 16.9 mg/dL — ABNORMAL HIGH (ref <1.0)

## 2020-10-29 MED ORDER — SODIUM CHLORIDE 0.9 % IV SOLN
2.0000 g | INTRAVENOUS | Status: DC
  Administered 2020-10-29 – 2020-10-30 (×2): 2 g via INTRAVENOUS
  Filled 2020-10-29 (×2): qty 20

## 2020-10-29 MED ORDER — INSULIN ASPART 100 UNIT/ML ~~LOC~~ SOLN
14.0000 [IU] | Freq: Three times a day (TID) | SUBCUTANEOUS | Status: DC
  Administered 2020-10-29 – 2020-10-30 (×3): 14 [IU] via SUBCUTANEOUS

## 2020-10-29 MED ORDER — OXYCODONE HCL 5 MG PO TABS: 5.0000 mg | ORAL_TABLET | ORAL | Status: DC | PRN

## 2020-10-29 MED ORDER — HYDRALAZINE HCL 50 MG PO TABS
50.0000 mg | ORAL_TABLET | Freq: Three times a day (TID) | ORAL | Status: DC
  Administered 2020-10-29 – 2020-10-31 (×7): 50 mg via ORAL
  Filled 2020-10-29 (×7): qty 1

## 2020-10-29 MED ORDER — TRAMADOL HCL 50 MG PO TABS: 50.0000 mg | ORAL_TABLET | Freq: Four times a day (QID) | ORAL | Status: DC | PRN

## 2020-10-29 MED ORDER — APIXABAN 5 MG PO TABS
10.0000 mg | ORAL_TABLET | Freq: Two times a day (BID) | ORAL | Status: DC
  Administered 2020-10-29 – 2020-10-31 (×4): 10 mg via ORAL
  Filled 2020-10-29 (×5): qty 2

## 2020-10-29 MED ORDER — ISOSORBIDE MONONITRATE ER 30 MG PO TB24
30.0000 mg | ORAL_TABLET | Freq: Every day | ORAL | Status: DC
Start: 2020-10-29 — End: 2020-10-31
  Administered 2020-10-29 – 2020-10-31 (×3): 30 mg via ORAL
  Filled 2020-10-29 (×3): qty 1

## 2020-10-29 MED ORDER — OXYCODONE HCL 5 MG PO TABS
5.0000 mg | ORAL_TABLET | ORAL | Status: DC | PRN
  Administered 2020-10-29: 5 mg via ORAL
  Filled 2020-10-29: qty 1

## 2020-10-29 MED ORDER — APIXABAN 5 MG PO TABS: 5.0000 mg | ORAL_TABLET | Freq: Two times a day (BID) | ORAL | Status: DC

## 2020-10-29 NOTE — Progress Notes (Signed)
ANTICOAGULATION CONSULT NOTE - Initial Consult  Pharmacy Consult:  Eliquis  Indication:  DVT  No Known Allergies  Patient Measurements: Height: 6' (182.9 cm) Weight: 112 kg (247 lb) IBW/kg (Calculated) : 77.6  Vital Signs: Temp: 99.1 F (37.3 C) (03/10 1523) Temp Source: Oral (03/10 1523) BP: 163/84 (03/10 1523) Pulse Rate: 91 (03/10 1523)  Labs: Recent Labs    10/27/20 0134 10/28/20 0239 10/29/20 0921  HGB 10.7* 10.8* 10.7*  HCT 29.2* 30.9* 29.6*  PLT 399 430* 456*  CREATININE 1.60* 1.44* 1.50*    Estimated Creatinine Clearance: 61.8 mL/min (A) (by C-G formula based on SCr of 1.5 mg/dL (H)).   Medical History: Past Medical History:  Diagnosis Date  . Hypercholesteremia   . Hypertension   . Type II diabetes mellitus with renal manifestations (HCC)      Assessment: 35 YOM with left foot osteomyelitis.  Patient has been on prophylactic Lovenox, last dose on 10/28/20 PM.  Pharmacy consulted to start Eliquis for DVT - Doppler still pending.  CBC stable; no bleeding reported.  Goal of Therapy:  Appropriate anticoagulation Monitor platelets by anticoagulation protocol: Yes   Plan:  Eliquis 10mg  PO BID x 7 days, then on 11/05/20 start 5mg  PO BID Pharmacy will sign off and follow peripherally.  Thank you for the consult!  Vernella Niznik D. 11/07/20, PharmD, BCPS, BCCCP 10/29/2020, 5:09 PM

## 2020-10-29 NOTE — Progress Notes (Signed)
Physical Therapy Treatment Patient Details Name: Richard Russell MRN: [id number] DOB: 02/19/1951 Today's Date: 10/29/2020    History of Present Illness Richard Russell is a 70 y.o. male who is sent from [facility] after presenting there with 1 month history of left toe injury, which turned into an ulcer on 10/23/2020. Pt underwnet L great toe amputation on 3/1.  PMH: hypercholesterolemia, hypertension, type 2 diabetes mellitus with renal manifestations, diabetic peripheral neuropathy, class I obesity, history of back surgery with occasional back pain exacerbation.    PT Comments    Pt sitting in recliner on arrival to room. He continues to report R knee pain, however stated it improved after HEP and ambulation. Pt increased ambulation distance this session, however required frequent cues to maintain WB through heel only, relaying heavily on LLE when sitting dur to R knee pain. Pt instructed to perform HEP 3x/day to improve pain, functional mobility, and strength in bil LEs. Will continue to follow acutely.    Follow Up Recommendations  Home health PT;Supervision/Assistance - 24 hour     Equipment Recommendations  Rolling walker with 5" wheels    Recommendations for Other Services       Precautions / Restrictions Precautions Precautions: Fall Required Braces or Orthoses: Other Brace Other Brace: Darco Shoe Restrictions Other Position/Activity Restrictions: Darco shoe, WB through heel    Mobility  Bed Mobility               General bed mobility comments: pt in recliner upon arrival    Transfers Overall transfer level: Needs assistance Equipment used: Rolling walker (2 wheeled) Transfers: Sit to/from Stand Sit to Stand: Min assist         General transfer comment: Cues for hand placement and assist to steady.  Ambulation/Gait Ambulation/Gait assistance: Min guard Gait Distance (Feet): 50 Feet Assistive device: Rolling walker (2 wheeled) Gait Pattern/deviations: Step-to  pattern;Decreased stance time - left Gait velocity: slow   General Gait Details: VC to maintain WB through heal only, pt occasionally rocking onto toes.   Stairs             Wheelchair Mobility    Modified Rankin (Stroke Patients Only)       Balance Overall balance assessment: Needs assistance Sitting-balance support: Feet supported;No upper extremity supported Sitting balance-Leahy Scale: Good     Standing balance support: During functional activity;Bilateral upper extremity supported Standing balance-Leahy Scale: Poor Standing balance comment: requires RW for safe standing and ambulation to maintain L LE WBing through heel only                            Cognition Arousal/Alertness: Awake/alert Behavior During Therapy: WFL for tasks assessed/performed Overall Cognitive Status: Within Functional Limits for tasks assessed                                        Exercises General Exercises - Lower Extremity Ankle Circles/Pumps: AROM;Both;10 reps;Seated Short Arc Quad: AROM;Both;10 reps;Seated Long Arc Quad: AROM;Both;10 reps;Seated Heel Slides: AROM;Both;10 reps;Seated Hip ABduction/ADduction: AROM;Both;10 reps;Seated    General Comments        Pertinent Vitals/Pain Pain Assessment: Faces Faces Pain Scale: Hurts little more Pain Location: R Knee pain, Pain Descriptors / Indicators: Discomfort;Sore Pain Intervention(s): Monitored during session;Limited activity within patient's tolerance;Repositioned    Home Living  Prior Function            PT Goals (current goals can now be found in the care plan section) Acute Rehab PT Goals Patient Stated Goal: get home PT Goal Formulation: With patient Time For Goal Achievement: 11/10/20 Potential to Achieve Goals: Good Progress towards PT goals: Progressing toward goals    Frequency    Min 4X/week      PT Plan Current plan remains appropriate     Co-evaluation              AM-PAC PT "6 Clicks" Mobility   Outcome Measure  Help needed turning from your back to your side while in a flat bed without using bedrails?: None Help needed moving from lying on your back to sitting on the side of a flat bed without using bedrails?: None Help needed moving to and from a bed to a chair (including a wheelchair)?: A Little Help needed standing up from a chair using your arms (e.g., wheelchair or bedside chair)?: A Little Help needed to walk in hospital room?: A Little Help needed climbing 3-5 steps with a railing? : A Lot 6 Click Score: 19    End of Session Equipment Utilized During Treatment: Gait belt (L darco shoe) Activity Tolerance: Patient tolerated treatment well Patient left: in chair;with call bell/phone within reach Nurse Communication: Mobility status PT Visit Diagnosis: Unsteadiness on feet (R26.81);Muscle weakness (generalized) (M62.81);Difficulty in walking, not elsewhere classified (R26.2)     Time: 3734-2876 PT Time Calculation (min) (ACUTE ONLY): 20 min  Charges:  $Gait Training: 8-22 mins                     Kallie Locks, [place] Pager [phone] Acute Rehab   Sheral Apley 10/29/2020, 2:35 PM

## 2020-10-29 NOTE — CV Procedure (Signed)
BLE venous duplex completed. Preliminary findings given to Claris Che, RN and Dr. Allena Katz.  Results can be found under chart review under CV PROC. 10/29/2020 3:17 PM Aarti Mankowski RVT, RDMS

## 2020-10-29 NOTE — Progress Notes (Addendum)
[facility] Progress Note  Patient: Richard Russell    AFB:[id number]  DOA: 10/23/2020     Date of Service: the patient was seen and examined on 10/29/2020  Brief hospital course: Past medical history of HLD, HTN, type II DM, neuropathy, obesity.  Presents with 1 month history of left great toe injury with infection found to have osteomyelitis of the great toe has been amputation on 3/6. Currently plan is follow-up on cultures for sensitivity.  Assessment and Plan: 1.  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 Lucia Gaskins Polymicrobial septic arthritis Admitted on 3/4 with fever and leukocytosis.  ESR 1.7 CRP 34.  Started on IV antibiotics. Orthopedic surgery consulted underwent left hallux MTP disarticulation, forefoot abscess incision and drainage. Deep cultures growing pansensitive Proteus, staph aureus, group G strep and diphtheroids. Leukocytosis finally getting better Blood cultures negative. Currently on IV cefepime and Flagyl.  Vancomycin was discontinued on 3/8.  Changing cefepime to ceftriaxone discontinue Flagyl. MRSA PCR negative. Pain remains well controlled.  Will monitor cultures for sensitivity for staph aureus. He is weightbearing in postop shoes, keep dressing in place, follow-up in 2 weeks for wound check, sutures will remain in place for 4 weeks. Antibiotic therapy to treat cellulitis for complete eradication. Patient reports right knee pain.  Currently conservative measures.  X-ray negative for any acute abnormality of joint effusion.  If pain does not improve will discuss with orthopedic regarding options.  2.  Type 2 diabetes mellitus, uncontrolled with hyperglycemia with diabetic neuropathy, long-term insulin use Home regimen includes 70 3060 units twice daily. Currently on Levemir 40 units twice daily plus aspart 14 units 3 times daily and sliding scale insulin. Hemoglobin A1c 10+. Monitor. Currently also  on gabapentin which is continued.  3.  Accelerated hypertension Blood pressure elevated. Continue clonidine patch, Norvasc, hydralazine. Initial plan was to resume enalapril but due to renal function switching enalapril to hydralazine and Imdur combination.  4.  Constipation. Initiate a bowel regimen.  Monitor.  5.  Hyperlipidemia Continue Lipitor.  6.  Obesity Placing the patient at high risk for poor outcomes especially in setting of weightbearing and amputation  Body mass index is 33.5 kg/m.   7.  DVT.  Acute. Patient has bilateral lower extremity edema.  Reported increasing pain on his right side. Lower extremity Doppler is positive for DVT on the left leg. Switch Lovenox to Eliquis per pharmacy.  Nutrition Problem: Increased nutrient needs Etiology: wound healing Interventions: Interventions: MVI,Refer to RD note for recommendations      Diet: Carb modified diet DVT Prophylaxis:   Place TED hose Start: 10/28/20 1237 apixaban (ELIQUIS) tablet 10 mg  apixaban (ELIQUIS) tablet 5 mg    Advance goals of care discussion: Full code  Family Communication: no family was present at bedside, at the time of interview.   Disposition:  Status is: Inpatient  Remains inpatient appropriate because:IV treatments appropriate due to intensity of illness or inability to take PO   Dispo:  Patient From: Home  Planned Disposition: Home with Health Care Svc  Medically stable for discharge: No    Subjective: No nausea no vomiting.  Continues to report right knee pain.  No fever no chills.  Has not used any pain medication so far.  Physical Exam:  General: Appear in mild distress, no Rash; Oral Mucosa Clear, moist. no Abnormal Neck Mass Or lumps, Conjunctiva normal  Cardiovascular: S1 and S2 Present, no Murmur, Respiratory: good respiratory  effort, Bilateral Air entry present and CTA, no Crackles, no wheezes Abdomen: Bowel Sound present, Soft and no tenderness Extremities:  Bilateral left more than right pedal edema  Neurology: alert and oriented to time, place, and person affect appropriate. no new focal deficit Gait not checked due to patient safety concerns   Vitals:   10/28/20 2036 10/29/20 0330 10/29/20 0815 10/29/20 1523  BP: (!) 187/85 (!) 175/87 (!) 186/91 (!) 163/84  Pulse: 99 92 (!) 103 91  Resp: 17 18 17 17   Temp: 98.3 F (36.8 C) 99.1 F (37.3 C) 97.8 F (36.6 C) 99.1 F (37.3 C)  TempSrc: Oral Oral Oral Oral  SpO2: 99% 96% 95% 93%  Weight:      Height:        Intake/Output Summary (Last 24 hours) at 10/29/2020 1820 Last data filed at 10/29/2020 1100 Gross per 24 hour  Intake --  Output 4700 ml  Net -4700 ml   Filed Weights   10/23/20 0934 10/25/20 0820  Weight: 112 kg 112 kg    Data Reviewed: I have personally reviewed and interpreted daily labs, tele strips, imaging. I reviewed all nursing notes, pharmacy notes, vitals, pertinent old records I have discussed plan of care as described above with RN and patient/family.  CBC: Recent Labs  Lab 10/23/20 1023 10/23/20 1950 10/24/20 0201 10/25/20 0115 10/26/20 0248 10/27/20 0134 10/28/20 0239 10/29/20 0921  WBC 15.9* 17.7*   < > 15.8* 15.1* 15.8* 15.0* 10.3  NEUTROABS 12.3* 13.4*  --   --   --   --   --  7.4  HGB 11.7* 12.0*   < > 10.5* 10.6* 10.7* 10.8* 10.7*  HCT 32.9* 34.4*   < > 28.4* 28.9* 29.2* 30.9* 29.6*  MCV 82.9 83.3   < > 81.4 82.1 81.8 83.3 82.2  PLT 347 348   < > 366 384 399 430* 456*   < > = values in this interval not displayed.   Basic Metabolic Panel: Recent Labs  Lab 10/25/20 0115 10/26/20 0248 10/27/20 0134 10/28/20 0239 10/29/20 0921  NA 130* 133* 131* 130* 129*  K 3.3* 3.9 3.5 3.6 3.9  CL 93* 99 97* 96* 95*  CO2 25 24 23 23 24   GLUCOSE 317* 250* 244* 218* 382*  BUN 19 18 18 20  24*  CREATININE 1.84* 1.82* 1.60* 1.44* 1.50*  CALCIUM 8.0* 8.1* 8.0* 8.1* 8.1*  MG 1.9  --   --   --   --   PHOS 2.4*  --   --   --   --     Studies: DG Knee  1-2 Views Right  Result Date: 10/29/2020 CLINICAL DATA:  Pain and swelling EXAM: RIGHT KNEE - 1-2 VIEW COMPARISON:  None. FINDINGS: Frontal and lateral views obtained. No evident fracture or dislocation. No joint effusion. Joint spaces appear normal. No erosive change. IMPRESSION: No fracture, dislocation, or joint effusion. No evident arthropathy. Electronically Signed   By: Lowella Grip III M.D.   On: 10/29/2020 11:01    Scheduled Meds: . (feeding supplement) PROSource Plus  30 mL Oral Daily  . amLODipine  10 mg Oral Daily  . apixaban  10 mg Oral BID   Followed by  . [START ON 11/05/2020] apixaban  5 mg Oral BID  . atorvastatin  40 mg Oral Daily  . cloNIDine  0.2 mg Transdermal Weekly  . diclofenac Sodium  2 g Topical TID  . fluticasone  2 spray Each Nare Daily  . gabapentin  800 mg Oral TID  . hydrALAZINE  50 mg Oral Q8H  . insulin aspart  0-15 Units Subcutaneous TID WC  . insulin aspart  14 Units Subcutaneous TID WC  . insulin detemir  40 Units Subcutaneous BID  . isosorbide mononitrate  30 mg Oral Daily  . methocarbamol  500 mg Oral BID  . multivitamin with minerals  1 tablet Oral Daily  . polyethylene glycol  17 g Oral Daily  . Ensure Max Protein  11 oz Oral BID  . senna-docusate  1 tablet Oral BID   Continuous Infusions: . cefTRIAXone (ROCEPHIN)  IV    . lactated ringers     PRN Meds: acetaminophen **OR** acetaminophen, hydrALAZINE, oxyCODONE, prochlorperazine  Time spent: 35 minutes  Author: Berle Mull, MD [facility] 10/29/2020 6:20 PM  To reach On-call, see care teams to locate the attending and reach out via [email or web address]. Between 7PM-7AM, please contact night-coverage If you still have difficulty reaching the attending provider, please page the [facility] (Director on Call) for [facility] on amion for assistance.

## 2020-10-29 NOTE — Progress Notes (Signed)
Inpatient Diabetes Program Recommendations  AACE/ADA: New Consensus Statement on Inpatient Glycemic Control   Target Ranges:  Prepandial:   less than 140 mg/dL      Peak postprandial:   less than 180 mg/dL (1-2 hours)      Critically ill patients:  140 - 180 mg/dL   Results for Richard Russell, Richard Russell" (MRN [id number]) as of 10/29/2020 12:08  Ref. Range 10/28/2020 06:30 10/28/2020 12:14 10/28/2020 16:59 10/28/2020 20:36 10/29/2020 06:23 10/29/2020 11:19  Glucose-Capillary Latest Ref Range: 70 - 99 mg/dL 175 (H) 102 (H) 585 (H) 215 (H) 246 (H) 383 (H)   Review of Glycemic Control  Diabetes history: DM2 Outpatient Diabetes medications: 70/30 60 units BID Current orders for Inpatient glycemic control: Levemir 40 units BID, Novolog 14 units TID with meals, Novolog 0-15 units TID with meals  Inpatient Diabetes Program Recommendations:    Insulin: Please consider increasing Levemir to 42 units BID, meal coverage to Novolog 16 units TID with meals, and adding Novolog 0-5 units QHS.  Thanks, Orlando Penner, RN, MSN, CDE Diabetes Coordinator Inpatient Diabetes Program [phone] (Team Pager from 8am to 5pm)

## 2020-10-29 NOTE — Plan of Care (Signed)

## 2020-10-30 DIAGNOSIS — M869 Osteomyelitis, unspecified: Secondary | ICD-10-CM | POA: Diagnosis not present

## 2020-10-30 LAB — CBC
HCT: 30.2 % — ABNORMAL LOW (ref 39.0–52.0)
Hemoglobin: 10.5 g/dL — ABNORMAL LOW (ref 13.0–17.0)
MCH: 29.2 pg (ref 26.0–34.0)
MCHC: 34.8 g/dL (ref 30.0–36.0)
MCV: 84.1 fL (ref 80.0–100.0)
Platelets: 503 10*3/uL — ABNORMAL HIGH (ref 150–400)
RBC: 3.59 MIL/uL — ABNORMAL LOW (ref 4.22–5.81)
RDW: 12.3 % (ref 11.5–15.5)
WBC: 13.7 10*3/uL — ABNORMAL HIGH (ref 4.0–10.5)
nRBC: 0 % (ref 0.0–0.2)

## 2020-10-30 LAB — BASIC METABOLIC PANEL
Anion gap: 9 (ref 5–15)
BUN: 27 mg/dL — ABNORMAL HIGH (ref 8–23)
CO2: 24 mmol/L (ref 22–32)
Calcium: 8.7 mg/dL — ABNORMAL LOW (ref 8.9–10.3)
Chloride: 96 mmol/L — ABNORMAL LOW (ref 98–111)
Creatinine, Ser: 1.52 mg/dL — ABNORMAL HIGH (ref 0.61–1.24)
GFR, Estimated: 50 mL/min — ABNORMAL LOW (ref >60)
Glucose, Bld: 339 mg/dL — ABNORMAL HIGH (ref 70–99)
Potassium: 4.4 mmol/L (ref 3.5–5.1)
Sodium: 129 mmol/L — ABNORMAL LOW (ref 135–145)

## 2020-10-30 LAB — GLUCOSE, CAPILLARY
Glucose-Capillary: 186 mg/dL — ABNORMAL HIGH (ref 70–99)
Glucose-Capillary: 323 mg/dL — ABNORMAL HIGH (ref 70–99)
Glucose-Capillary: 234 mg/dL — ABNORMAL HIGH (ref 70–99)
Glucose-Capillary: 212 mg/dL — ABNORMAL HIGH (ref 70–99)

## 2020-10-30 MED ORDER — METHOCARBAMOL 500 MG PO TABS
500.0000 mg | ORAL_TABLET | Freq: Three times a day (TID) | ORAL | Status: DC
  Administered 2020-10-30 – 2020-10-31 (×5): 500 mg via ORAL
  Filled 2020-10-30 (×4): qty 1

## 2020-10-30 MED ORDER — ONDANSETRON HCL 4 MG/2ML IJ SOLN
4.0000 mg | Freq: Four times a day (QID) | INTRAMUSCULAR | Status: DC | PRN
  Filled 2020-10-30: qty 2

## 2020-10-30 MED ORDER — INSULIN ASPART 100 UNIT/ML ~~LOC~~ SOLN
15.0000 [IU] | Freq: Three times a day (TID) | SUBCUTANEOUS | Status: DC
  Administered 2020-10-31 (×2): 15 [IU] via SUBCUTANEOUS

## 2020-10-30 MED ORDER — INSULIN DETEMIR 100 UNIT/ML ~~LOC~~ SOLN
50.0000 [IU] | Freq: Two times a day (BID) | SUBCUTANEOUS | Status: DC
  Administered 2020-10-30 – 2020-10-31 (×2): 50 [IU] via SUBCUTANEOUS
  Filled 2020-10-30 (×3): qty 0.5

## 2020-10-30 NOTE — Plan of Care (Signed)

## 2020-10-30 NOTE — Progress Notes (Signed)
Inpatient Diabetes Program Recommendations  AACE/ADA: New Consensus Statement on Inpatient Glycemic Control   Target Ranges:  Prepandial:   less than 140 mg/dL      Peak postprandial:   less than 180 mg/dL (1-2 hours)      Critically ill patients:  140 - 180 mg/dL     Review of Glycemic Control Results for ANASTACIO, BUA" (MRN [id number]) as of 10/30/2020 11:33  Ref. Range 10/29/2020 06:23 10/29/2020 11:19 10/29/2020 16:05 10/29/2020 22:28 10/30/2020 06:12 10/30/2020 11:03  Glucose-Capillary Latest Ref Range: 70 - 99 mg/dL 286 (H) 381 (H) 771 (H) 164 (H) 186 (H) 323 (H)   Diabetes history: DM2 Outpatient Diabetes medications: 70/30 60 units BID Current orders for Inpatient glycemic control: Levemir 40 units BID, Novolog 14 units TID with meals, Novolog 0-15 units TID with meals  Inpatient Diabetes Program Recommendations:    -  Increase Levemir to 45 units BID -  Increase meal coverage to Novolog 16 units TID with meals  Thanks, Christena Deem RN, MSN, BC-ADM Inpatient Diabetes Coordinator Team Pager [phone] (8a-5p)

## 2020-10-30 NOTE — Progress Notes (Addendum)
OT Cancellation Note  Patient Details Name: Richard Russell MRN: [id number] DOB: [birth year]   Cancelled Treatment:    Reason Eval/Treat Not Completed: Other (comment) pt requesting to eat breakfast prior to working with therapy. Will check back as time allows for OT session.  Returned at 935 AM to see pt with pt reporting he had not received morning meds, pt requesting to have meds prior to therapy, will continue efforts as time allows.  Pollyann Glen K., COTA/L Acute Rehabilitation Services [phone]   Barron Schmid 10/30/2020, 8:09 AM

## 2020-10-30 NOTE — Progress Notes (Signed)
Occupational Therapy Treatment Patient Details Name: Richard Russell MRN: [id number] DOB: October 10, 1950 Today's Date: 10/30/2020    History of present illness Richard Russell is a 70 y.o. male who is sent from [facility] after presenting there with 1 month history of left toe injury, which turned into an ulcer on 10/23/2020. Pt underwnet L great toe amputation on 3/1.  PMH: hypercholesterolemia, hypertension, type 2 diabetes mellitus with renal manifestations, diabetic peripheral neuropathy, class I obesity, history of back surgery with occasional back pain exacerbation.   OT comments  Pt making good progress with functional goal. Sit - stand from EOB to RW min A. Pt ambulated to bathroom with RW min guard A. Pt transferred to toilet with min verbal cues for correct hand placement to 3 in 1 over toilet. Clothing mgt min guard A. Pt stood at sink to wash and dry hands and face min guard A. Pt reports fatigue with these activities. OT will continue to follow acutely to maximize level of function and safety  Follow Up Recommendations  Supervision - Intermittent;No OT follow up    Equipment Recommendations  Other (comment);3 in 1 bedside commode (RW, reacher)    Recommendations for Other Services      Precautions / Restrictions Precautions Precautions: Fall Required Braces or Orthoses: Other Brace Other Brace: Darco Shoe Restrictions Weight Bearing Restrictions: No LLE Weight Bearing: Non weight bearing Other Position/Activity Restrictions: Darco shoe, WB through heel       Mobility Bed Mobility Overal bed mobility: Modified Independent                  Transfers Overall transfer level: Needs assistance Equipment used: Rolling walker (2 wheeled) Transfers: Sit to/from [employer] Sit to Stand: Min assist Stand pivot transfers: Min guard       General transfer comment: Cues for hand placement and assist to steady.    Balance Overall balance assessment: Needs  assistance Sitting-balance support: Feet supported;No upper extremity supported Sitting balance-Leahy Scale: Good     Standing balance support: During functional activity;Bilateral upper extremity supported Standing balance-Leahy Scale: Poor                             ADL either performed or assessed with clinical judgement   ADL Overall ADL's : Needs assistance/impaired     Grooming: Wash/dry hands;Wash/dry face;Standing;Supervision/safety;Set up       Lower Body Bathing: Sitting/lateral leans Lower Body Bathing Details (indicate cue type and reason): simulated seated EOB         Toilet Transfer: Stand-pivot;RW;Minimal assistance;Min guard;Comfort height toilet;Cueing for safety   Toileting- Clothing Manipulation and Hygiene: Min guard;Sit to/from stand       Functional mobility during ADLs: Minimal assistance;Min guard;Rolling walker;Cueing for safety General ADL Comments: reviewed compensatory LB ADL techniques     Vision Patient Visual Report: No change from baseline     Perception     Praxis      Cognition Arousal/Alertness: Awake/alert Behavior During Therapy: WFL for tasks assessed/performed Overall Cognitive Status: Within Functional Limits for tasks assessed                                          Exercises     Shoulder Instructions       General Comments      Pertinent Vitals/ Pain  Pain Assessment: Faces Faces Pain Scale: Hurts a little bit Pain Location: R Knee pain, Pain Descriptors / Indicators: Discomfort;Sore Pain Intervention(s): Monitored during session;Repositioned;Premedicated before session  Home Living                                          Prior Functioning/Environment              Frequency  Min 2X/week        Progress Toward Goals  OT Goals(current goals can now be found in the care plan section)  Progress towards OT goals: Progressing toward  goals  Acute Rehab OT Goals Patient Stated Goal: get home  Plan Discharge plan remains appropriate    Co-evaluation                 AM-PAC OT "6 Clicks" Daily Activity     Outcome Measure   Help from another person eating meals?: None Help from another person taking care of personal grooming?: A Little Help from another person toileting, which includes using toliet, bedpan, or urinal?: A Little Help from another person bathing (including washing, rinsing, drying)?: A Little Help from another person to put on and taking off regular upper body clothing?: None Help from another person to put on and taking off regular lower body clothing?: A Little 6 Click Score: 20    End of Session Equipment Utilized During Treatment: Gait belt;Rolling walker;Other (comment) (L Darco Shoe, 3 in 1 over toilet)  OT Visit Diagnosis: Unsteadiness on feet (R26.81);Other abnormalities of gait and mobility (R26.89);Pain Pain - Right/Left: Left Pain - part of body: Ankle and joints of foot   Activity Tolerance Patient tolerated treatment well   Patient Left with call bell/phone within reach;in bed   Nurse Communication          Time: 5176-1607 OT Time Calculation (min): 20 min  Charges: OT General Charges $OT Visit: 1 Visit OT Treatments $Self Care/Home Management : 8-22 mins     Galen Manila 10/30/2020, 3:21 PM

## 2020-10-30 NOTE — Progress Notes (Addendum)
[facility] Progress Note  Patient: Richard Russell    XBJ:[id number]  DOA: 10/23/2020     Date of Service: the patient was seen and examined on 10/30/2020  Brief hospital course: Past medical history of HLD, HTN, type II DM, neuropathy, obesity.  Presents with 1 month history of left great toe injury with infection found to have osteomyelitis of the great toe has been amputation on 3/6. Currently plan is follow-up with improvement in stability on right knee  Assessment and Plan: 1.  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 Lucia Gaskins Polymicrobial septic arthritis Admitted on 3/4 with fever and leukocytosis.  ESR 1.7 CRP 34.  Orthopedic surgery consulted, S/P left hallux MTP disarticulation, forefoot abscess incision and drainage. Deep cultures growing pansensitive Proteus, and staph aureus.also shows group G strep and diphtheroids. Leukocytosis finally getting better. Blood cultures negative. Initially on IV cefepime and Flagyl as well as vancomycin.  Vancomycin was discontinued on 3/8.  Cefepime changed to ceftriaxone on 3/10. Flagyl discontinued. MRSA PCR negative. Pain remains well controlled. He is weightbearing in postop shoes, keep dressing in place, follow-up in 2 weeks for wound check, sutures will remain in place for 4 weeks. Antibiotic therapy to treat cellulitis for complete eradication. Patient reports right knee pain.  Currently conservative measures.  X-ray negative for any acute abnormality of joint effusion.  If pain does not improve will discuss with orthopedic regarding options.  2.  Type 2 diabetes mellitus, uncontrolled with hyperglycemia with diabetic neuropathy, long-term insulin use Home regimen includes 70 3060 units twice daily. Currently on Levemir 40 units twice daily plus aspart 14 units 3 times daily and sliding scale insulin. Hemoglobin A1c 10+. Monitor. Currently also on gabapentin which is  continued.  3.  Accelerated hypertension Blood pressure elevated. Continue clonidine patch, Norvasc, hydralazine. Initial plan was to resume enalapril but due to renal function switching enalapril to hydralazine and Imdur combination.  4.  Constipation. Initiate a bowel regimen.  Monitor.  5.  Hyperlipidemia Continue Lipitor.  6.  Obesity Placing the patient at high risk for poor outcomes especially in setting of weightbearing and amputation  Body mass index is 33.5 kg/m.   7.  DVT.  Acute. Patient has bilateral lower extremity edema.  Reported increasing pain on his right side. Lower extremity Doppler is positive for DVT on the left leg. Switch Lovenox to Eliquis per pharmacy.  8.  Right knee pain and swelling. X-ray negative for any acute fracture or fluid collection. But patient unable to bear any weight on the knee.  Already limited on his left leg secondary to his gangrene and amputation.  Will monitor response to pain medication.  9.  Hyponatremia. Likely pseudohyponatremia in the setting of hyperglycemia. No intervention for now. Monitor.  Nutrition Problem: Increased nutrient needs Etiology: wound healing Interventions: Interventions: MVI,Refer to RD note for recommendations  Diet: Carb modified diet DVT Prophylaxis:   Place TED hose Start: 10/28/20 1237 apixaban (ELIQUIS) tablet 10 mg  apixaban (ELIQUIS) tablet 5 mg    Advance goals of care discussion: Full code  Family Communication: no family was present at bedside, at the time of interview.   Disposition:  Status is: Inpatient  Remains inpatient appropriate because:IV treatments appropriate due to intensity of illness or inability to take PO  Dispo:  Patient From: Home  Planned Disposition: Home with Health Care Svc  Medically stable for discharge: No    Subjective: No nausea no  vomiting no fever no chills.  Continues to have pain on his right leg.  Physical Exam:  General: Appear in mild  distress, no Rash; Oral Mucosa Clear, moist. no Abnormal Neck Mass Or lumps, Conjunctiva normal  Cardiovascular: S1 and S2 Present, no Murmur, Respiratory: good respiratory effort, Bilateral Air entry present and CTA, no Crackles, no wheezes Abdomen: Bowel Sound present, Soft and no tenderness Extremities: Bilateral pedal edema Neurology: alert and oriented to time, place, and person affect appropriate. no new focal deficit Gait not checked due to patient safety concerns  Vitals:   10/29/20 2009 10/30/20 0428 10/30/20 0808 10/30/20 1522  BP: (!) 146/80 (!) 147/79 (!) 163/83 (!) 149/77  Pulse: (!) 105 94 98 98  Resp: _0 Temp: 98.7 F (37.1 C) 98.5 F (36.9 C) 98.1 F (36.7 C) 98.7 F (37.1 C)  TempSrc: Oral Oral Oral Oral  SpO2: 96% 96% 97% 97%  Weight:      Height:        Intake/Output Summary (Last 24 hours) at 10/30/2020 1947 Last data filed at 10/30/2020 1400 Gross per 24 hour  Intake 100 ml  Output 1800 ml  Net -1700 ml   Filed Weights   10/23/20 0934 10/25/20 0820  Weight: 112 kg 112 kg    Data Reviewed: I have personally reviewed and interpreted daily labs, tele strips, imaging. I reviewed all nursing notes, pharmacy notes, vitals, pertinent old records I have discussed plan of care as described above with RN and patient/family.  CBC: Recent Labs  Lab 10/23/20 1950 10/24/20 0201 10/26/20 0248 10/27/20 0134 10/28/20 0239 10/29/20 0921 10/30/20 0951  WBC 17.7*   < > 15.1* 15.8* 15.0* 10.3 13.7*  NEUTROABS 13.4*  --   --   --   --  7.4  --   HGB 12.0*   < > 10.6* 10.7* 10.8* 10.7* 10.5*  HCT 34.4*   < > 28.9* 29.2* 30.9* 29.6* 30.2*  MCV 83.3   < > 82.1 81.8 83.3 82.2 84.1  PLT 348   < > 384 399 430* 456* 503*   < > = values in this interval not displayed.   Basic Metabolic Panel: Recent Labs  Lab 10/25/20 0115 10/26/20 0248 10/27/20 0134 10/28/20 0239 10/29/20 0921 10/30/20 0951  NA 130* 133* 131* 130* 129* 129*  K 3.3* 3.9 3.5 3.6 3.9  4.4  CL 93* 99 97* 96* 95* 96*  CO2 _1 GLUCOSE 317* 250* 244* 218* 382* 339*  BUN _2 24* 27*  CREATININE 1.84* 1.82* 1.60* 1.44* 1.50* 1.52*  CALCIUM 8.0* 8.1* 8.0* 8.1* 8.1* 8.7*  MG 1.9  --   --   --   --   --   PHOS 2.4*  --   --   --   --   --     Studies: No results found.  Scheduled Meds: . (feeding supplement) PROSource Plus  30 mL Oral Daily  . amLODipine  10 mg Oral Daily  . apixaban  10 mg Oral BID   Followed by  . [START ON 11/05/2020] apixaban  5 mg Oral BID  . atorvastatin  40 mg Oral Daily  . cloNIDine  0.2 mg Transdermal Weekly  . diclofenac Sodium  2 g Topical TID  . fluticasone  2 spray Each Nare Daily  . gabapentin  800 mg Oral TID  . hydrALAZINE  50 mg Oral Q8H  . insulin aspart  0-15  Units Subcutaneous TID WC  . insulin aspart  14 Units Subcutaneous TID WC  . insulin detemir  40 Units Subcutaneous BID  . isosorbide mononitrate  30 mg Oral Daily  . methocarbamol  500 mg Oral TID  . multivitamin with minerals  1 tablet Oral Daily  . polyethylene glycol  17 g Oral Daily  . Ensure Max Protein  11 oz Oral BID  . senna-docusate  1 tablet Oral BID   Continuous Infusions: . cefTRIAXone (ROCEPHIN)  IV 2 g (10/29/20 2104)  . lactated ringers     PRN Meds: acetaminophen **OR** acetaminophen, hydrALAZINE, ondansetron (ZOFRAN) IV, oxyCODONE, traMADol  Time spent: 35 minutes  Author: Berle Mull, MD [facility] 10/30/2020 7:47 PM  To reach On-call, see care teams to locate the attending and reach out via [email or web address]. Between 7PM-7AM, please contact night-coverage If you still have difficulty reaching the attending provider, please page the [facility] (Director on Call) for [facility] on amion for assistance.

## 2020-10-31 ENCOUNTER — Other Ambulatory Visit (HOSPITAL_COMMUNITY): Payer: Self-pay | Admitting: Internal Medicine

## 2020-10-31 DIAGNOSIS — M869 Osteomyelitis, unspecified: Secondary | ICD-10-CM | POA: Diagnosis not present

## 2020-10-31 LAB — CBC
HCT: 30.1 % — ABNORMAL LOW (ref 39.0–52.0)
Hemoglobin: 10.6 g/dL — ABNORMAL LOW (ref 13.0–17.0)
MCH: 29.8 pg (ref 26.0–34.0)
MCHC: 35.2 g/dL (ref 30.0–36.0)
MCV: 84.6 fL (ref 80.0–100.0)
Platelets: 523 10*3/uL — ABNORMAL HIGH (ref 150–400)
RBC: 3.56 MIL/uL — ABNORMAL LOW (ref 4.22–5.81)
RDW: 12.3 % (ref 11.5–15.5)
WBC: 13 10*3/uL — ABNORMAL HIGH (ref 4.0–10.5)
nRBC: 0 % (ref 0.0–0.2)

## 2020-10-31 LAB — BASIC METABOLIC PANEL
Anion gap: 7 (ref 5–15)
BUN: 25 mg/dL — ABNORMAL HIGH (ref 8–23)
CO2: 28 mmol/L (ref 22–32)
Calcium: 8.9 mg/dL (ref 8.9–10.3)
Chloride: 98 mmol/L (ref 98–111)
Creatinine, Ser: 1.42 mg/dL — ABNORMAL HIGH (ref 0.61–1.24)
GFR, Estimated: 54 mL/min — ABNORMAL LOW (ref >60)
Glucose, Bld: 269 mg/dL — ABNORMAL HIGH (ref 70–99)
Potassium: 4.3 mmol/L (ref 3.5–5.1)
Sodium: 133 mmol/L — ABNORMAL LOW (ref 135–145)

## 2020-10-31 LAB — MAGNESIUM: Magnesium: 1.9 mg/dL (ref 1.7–2.4)

## 2020-10-31 LAB — GLUCOSE, CAPILLARY
Glucose-Capillary: 224 mg/dL — ABNORMAL HIGH (ref 70–99)
Glucose-Capillary: 263 mg/dL — ABNORMAL HIGH (ref 70–99)
Glucose-Capillary: 192 mg/dL — ABNORMAL HIGH (ref 70–99)

## 2020-10-31 MED ORDER — ISOSORBIDE MONONITRATE ER 30 MG PO TB24: 30.0000 mg | ORAL_TABLET | Freq: Every day | ORAL | 0 refills | Status: AC

## 2020-10-31 MED ORDER — POLYETHYLENE GLYCOL 3350 17 G PO PACK: 17.0000 g | PACK | Freq: Every day | ORAL | 0 refills | Status: AC

## 2020-10-31 MED ORDER — PROSOURCE PLUS PO LIQD: 30.0000 mL | Freq: Every day | ORAL | 0 refills | Status: DC

## 2020-10-31 MED ORDER — CEFDINIR 300 MG PO CAPS: 300.0000 mg | ORAL_CAPSULE | Freq: Two times a day (BID) | ORAL | 0 refills | Status: AC

## 2020-10-31 MED ORDER — HYDROCODONE-ACETAMINOPHEN 5-325 MG PO TABS: 1.0000 | ORAL_TABLET | Freq: Four times a day (QID) | ORAL | 0 refills | Status: AC | PRN

## 2020-10-31 MED ORDER — HYDROCODONE-ACETAMINOPHEN 5-325 MG PO TABS: 1.0000 | ORAL_TABLET | Freq: Four times a day (QID) | ORAL | 0 refills | Status: DC | PRN

## 2020-10-31 MED ORDER — HYDRALAZINE HCL 50 MG PO TABS
50.0000 mg | ORAL_TABLET | Freq: Three times a day (TID) | ORAL | 0 refills | Status: AC
Start: 2020-10-31 — End: ?

## 2020-10-31 MED ORDER — ENSURE MAX PROTEIN PO LIQD: 11.0000 [oz_av] | Freq: Two times a day (BID) | ORAL | 0 refills | Status: AC

## 2020-10-31 MED ORDER — APIXABAN (ELIQUIS) VTE STARTER PACK (10MG AND 5MG): ORAL_TABLET | ORAL | 0 refills | Status: AC

## 2020-10-31 NOTE — Progress Notes (Signed)
Discharge summary packet provided to pt with instructions. Pt verbalized understanding of instructions. All concerns and questions were fully answered. No complaints. D/C to home/ HH as ordered. Pt's daughter would be responsible for pt's ride.

## 2020-10-31 NOTE — Plan of Care (Signed)
No acute changes. Voltaren applied to right knee as ordered for pain. Dsg to left foot clean dry and intact. Rocephin IV given as ordered. Discharge plan is home with [facility] once medically stable. NAD. Will continue to monitor and continue current POC.

## 2020-10-31 NOTE — TOC Transition Note (Addendum)
Transition of Care [facility]) - CM/SW Discharge Note   Patient Details  Name: Richard Russell MRN: [id number] Date of Birth: 1951/08/07  [facility] of [facility] CM/SW Contact:  Janae Bridgeman, RN Phone Number: 10/31/2020, 2:24 PM   Clinical Narrative:    Case management spoke with the patient on the phone regarding transitions of care to home.  The patient lives at home with his daughter and plans to discharge home this evening.  He was given Medicare choice regarding home health and dme and he was set up with [facility] 4 days ago prior to my visit.  Message left with Mayra Reel to update for discharge home today. Patient will need a RW for home.   Bedside nursing to provide a RW from the supply closet on the floor.  HH and dme orders are present.   Final next level of care: Home w Home Health Services Barriers to Discharge: No Barriers Identified   Patient Goals and CMS Choice Patient states their goals for this hospitalization and ongoing recovery are:: Patient plans to discharge home today with home health. CMS Medicare.gov Compare Post Acute Care list provided to:: Patient Choice offered to / list presented to : Patient  Discharge Placement                       Discharge Plan and Services   Discharge Planning Services: CM Consult Post Acute Care Choice: Durable Medical Equipment,Home Health          DME Arranged: Dan Humphreys rolling DME Agency: [employer] Date DME Agency Contacted: 10/31/20 Time DME Agency Contacted: 1410 Representative spoke with at DME Agency: RN to provide from [employer] closet. HH Arranged: RN,PT HH Agency: [facility] Date HH Agency Contacted: 10/31/20 Time HH Agency Contacted: 1423 Representative spoke with at [facility] Agency: Delila Spence [facility]  Social Determinants of Health (SDOH) Interventions     Readmission Risk Interventions No flowsheet data found.

## 2020-10-31 NOTE — Progress Notes (Signed)
Physical Therapy Treatment Patient Details Name: Richard Russell MRN: [id number] DOB: 25-Feb-1951 Today's Date: 10/31/2020    History of Present Illness Richard Russell is a 70 y.o. male who is sent from [facility] after presenting there with 1 month history of left toe injury, which turned into an ulcer on 10/23/2020. Pt underwnet L great toe amputation on 3/1.  PMH: hypercholesterolemia, hypertension, type 2 diabetes mellitus with renal manifestations, diabetic peripheral neuropathy, class I obesity, history of back surgery with occasional back pain exacerbation.    PT Comments    Patient progressing well with mobility.  Still requires min guard assistance for gait for balance/safety.  Feel patient can go home with 24 hour care of daughter and RW.  Recommend HHPT for progressive gait training and balance training.    Follow Up Recommendations  Home health PT;Supervision/Assistance - 24 hour     Equipment Recommendations  Rolling walker with 5" wheels    Recommendations for Other Services       Precautions / Restrictions Precautions Precautions: Fall Required Braces or Orthoses: Other Brace Other Brace: Darco Shoe Restrictions Other Position/Activity Restrictions: Darco shoe, WB through heel    Mobility  Bed Mobility               General bed mobility comments: pt in recliner upon arrival    Transfers Overall transfer level: Needs assistance Equipment used: Rolling walker (2 wheeled) Transfers: Sit to/from Stand Sit to Stand: Min guard         General transfer comment: cues for hand placement to push up from chair versus pulling up on RW  Ambulation/Gait Ambulation/Gait assistance: Min guard Gait Distance (Feet): 60 Feet Assistive device: Rolling walker (2 wheeled) Gait Pattern/deviations: Step-to pattern;Decreased stance time - left;Decreased stride length Gait velocity: slow   General Gait Details: did much better maintaining weight through heel of Darco shoe; min  guard for safety   Stairs             Wheelchair Mobility    Modified Rankin (Stroke Patients Only)       Balance Overall balance assessment: Needs assistance Sitting-balance support: Feet supported;No upper extremity supported Sitting balance-Leahy Scale: Good     Standing balance support: During functional activity;Bilateral upper extremity supported   Standing balance comment: requires RW for safe standing and ambulation to maintain L LE WBing through heel only                            Cognition Arousal/Alertness: Awake/alert Behavior During Therapy: WFL for tasks assessed/performed Overall Cognitive Status: Within Functional Limits for tasks assessed                                        Exercises General Exercises - Lower Extremity Ankle Circles/Pumps: AROM;10 reps;Both;Seated    General Comments        Pertinent Vitals/Pain Pain Assessment: No/denies pain    Home Living                      Prior Function            PT Goals (current goals can now be found in the care plan section) Progress towards PT goals: Progressing toward goals    Frequency    Min 4X/week      PT Plan Current plan remains appropriate  Co-evaluation              AM-PAC PT "6 Clicks" Mobility   Outcome Measure  Help needed turning from your back to your side while in a flat bed without using bedrails?: None Help needed moving from lying on your back to sitting on the side of a flat bed without using bedrails?: None Help needed moving to and from a bed to a chair (including a wheelchair)?: A Little Help needed standing up from a chair using your arms (e.g., wheelchair or bedside chair)?: A Little Help needed to walk in hospital room?: A Little Help needed climbing 3-5 steps with a railing? : A Little 6 Click Score: 20    End of Session Equipment Utilized During Treatment: Gait belt Activity Tolerance: Patient  tolerated treatment well Patient left: in chair;with call bell/phone within reach   PT Visit Diagnosis: Unsteadiness on feet (R26.81);Muscle weakness (generalized) (M62.81);Difficulty in walking, not elsewhere classified (R26.2)     Time: 1454-1510 PT Time Calculation (min) (ACUTE ONLY): 16 min  Charges:  $Gait Training: 8-22 mins                     10/31/2020 Margie, PT Acute Rehabilitation Services Pager:  [phone] Office:  [phone]     Olivia Canter 10/31/2020, 3:16 PM

## 2020-11-01 LAB — AEROBIC/ANAEROBIC CULTURE W GRAM STAIN (SURGICAL/DEEP WOUND)

## 2020-11-03 NOTE — Discharge Summary (Signed)
[facility] Discharge Summary   Patient: Richard Russell XLK:[id number]  PCP: [facility]  Date of admission: 10/23/2020   Date of discharge: 10/31/2020     Discharge Diagnoses:  Principal Problem:   Osteomyelitis of great toe of left foot (Richard Russell) Active Problems:   HTN (hypertension)   Type 2 diabetes mellitus with hyperglycemia (HCC)   Hypercholesteremia   Normocytic anemia   Hypokalemia   Elevated serum creatinine   Mild protein malnutrition ([place])   Admitted From: home Disposition:  Home with home health  Recommendations for Outpatient Follow-up:  1. PCP: Follow-up with PCP in 1 week as well as podiatry and orthopedic as recommended. 2. Follow up LABS/TEST: None   Follow-up Information    Evelina Bucy, DPM. Call in 1 day(s).   Specialty: Podiatry Why: Please call for a post hospital follow up appointment. Contact information: [place] [address] [phone]        Clinic, [place] Call in 1 day(s).   Why: Please call for a post hospital follow up appointment. Contact information: [place] [address] [place], [place] [facility] Follow up.   Why: The RN on the floor will provide you with a rolling walker from the unit stock before discharging home this evening. Contact information: [address] [place] [address] [phone]        [facility] Follow up.   Specialty: Rehabilitation Why: [facility] will providing you with home health services.  They will call you in the next 24-48 hours to set up home health visits. Contact information: [place]. [place] [address] [phone]       Erle Crocker, MD. Schedule an appointment as soon as possible for a visit in 2 week(s).   Specialty: Orthopedic Surgery Contact information: [place]  [phone]              Discharge Instructions    Diet - low sodium heart healthy   Complete by: As directed    Increase activity slowly   Complete by: As directed    Leave dressing on - Keep it clean, dry, and intact until clinic visit   Complete by: As directed       Diet recommendation: Cardiac diet  Activity: The patient is advised to gradually reintroduce usual activities, as tolerated  Discharge Condition: stable  Code Status: Full code   History of present illness: As per the H and P dictated on admission, "Richard Russell is a 70 y.o. male with medical history significant of hypercholesterolemia, hypertension, type 2 diabetes mellitus with renal manifestations, diabetic peripheral neuropathy, class I obesity, history of back surgery with occasional back pain exacerbation who is sent from [facility] after presenting there with 1 month history of left toe injury, which turned into an ulcer that he has been managing with unknown OTC spray and pressure stockings.  However, he has recently reinjured the area developing more erythema, edema, calor and purulent discharge.  He does not complain of significant pain because decreased sensation secondary to diabetic peripheral neuropathy.  Review of system is significant for fever yesterday and once again on arrival to the ED.  He denies night sweats or chills, appetite changes, nausea or emesis.  No rhinorrhea, sore throat, dyspnea, wheezing or hemoptysis.  Denies chest pain, palpitations, diaphoresis, PND, orthopnea, but stay his left foot area has  been edematous.  No abdominal pain, constipation, melena or hematochezia.  He had 2 episodes of loose stools earlier today, but states this has been self-limited.  No dysuria, frequency or hematuria.  Complains of polydipsia and polyuria, denies polyphagia or blurred vision.  ED Course: Initial vital signs were temperature 100.6 F, pulse 79, respiration 18, BP 160/75 mmHg O2 sat 98% on room  air.  The patient received acetaminophen 1000 mg p.o. x1, vancomycin and cefepime per pharmacy."  Hospital Course:  Summary of his active problems in the hospital is as following. 1.  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 Lucia Gaskins Polymicrobial septic arthritis Right knee pain Admitted on 3/4 with fever and leukocytosis.  ESR 1.7 CRP 34.  Orthopedic surgery consulted, S/P left hallux MTP disarticulation, forefoot abscess incision and drainage. Deep cultures growing pansensitive Proteus, and staph aureus.also shows group G strep and diphtheroids. Leukocytosis finally getting better. Blood cultures negative. Initially on IV cefepime and Flagyl as well as vancomycin. Vancomycin was discontinued on 3/8.  Cefepime changed to ceftriaxone on 3/10. Flagyl discontinued. On discharge on [place] for 1 more week. MRSA PCR negative. Pain remains well controlled. He is weightbearing in postop shoes, keep dressing in place, follow-up in 2 weeks for wound check, sutures will remain in place for 4 weeks. Antibiotic therapy to treat cellulitis for complete eradication. Patient reports right knee pain.  Currently conservative measures. X-ray negative for any acute abnormality of joint effusion.  Likely arthritis.  2.  Type 2 diabetes mellitus, uncontrolled with hyperglycemia with diabetic neuropathy, long-term insulin use Home regimen includes 70 3060 units twice daily. Currently on Levemir 40 units twice daily plus aspart 14 units 3 times daily and sliding scale insulin.  On discharge we will continue home regimen of 7030. Hemoglobin A1c 10+. Currently also on gabapentin which is continued.  3.  Accelerated hypertension Blood pressure elevated. Continue clonidine patch, Norvasc, hydralazine. Initial plan was to resume enalapril but due to renal function switching enalapril to hydralazine and Imdur combination. Outpatient follow-up with  PCP  4.  Constipation. Initiate a bowel regimen.  Monitor.  5.  Hyperlipidemia Continue Lipitor.  6.  Obesity Placing the patient at high risk for poor outcomes especially in setting of weightbearing and amputation Body mass index is 33.5 kg/m.   7.  DVT.  Acute.  Left leg.  Patient has bilateral lower extremity edema.  Reported increasing pain on his right side. Lower extremity Doppler is positive for DVT on the left leg. Switch Lovenox to Eliquis per pharmacy.  8.  Right knee pain and swelling. X-ray negative for any acute fracture or fluid collection. But patient unable to bear any weight on the knee.  Already limited on his left leg secondary to his gangrene and amputation.   9.  Hyponatremia. Likely pseudohyponatremia in the setting of hyperglycemia. Sodium improved after correction of hyperglycemia.  10. Pain control  - [place] Controlled Substance Reporting System database was reviewed. - 5 day supply was provided. - Patient was instructed, not to drive, operate heavy machinery, perform activities at heights, swimming or participation in water activities or provide baby sitting services while on Pain, Sleep and Anxiety Medications; until his outpatient Physician has advised to do so again.  - Also recommended to not to take more than prescribed Pain, Sleep and Anxiety Medications.  Patient was seen by physical therapy, who recommended Home Health On the day of the discharge the patient's  vitals were stable, and no other acute medical condition were reported by patient. The patient was felt safe to be discharge at Home with Home health.  Consultants: Orthopedics Procedures: Left hallux MTP disarticulation Left forefoot abscess incision and drainage multiple bursal cavities  DISCHARGE MEDICATION: Allergies as of 10/31/2020   No Known Allergies     Medication List    STOP taking these medications   enalapril 20 MG tablet Commonly known as: VASOTEC      TAKE these medications   amLODipine 10 MG tablet Commonly known as: NORVASC Take 10 mg by mouth daily.   Apixaban Starter Pack (46m and 580m Commonly known as: ELIQUIS STARTER PACK Take as directed on package: start with two-15m36mablets twice daily for 7 days. On day 8, switch to one-15mg30mblet twice daily.   atorvastatin 80 MG tablet Commonly known as: LIPITOR Take 40 mg by mouth daily.   cefdinir 300 MG capsule Commonly known as: OMNICEF Take 1 capsule (300 mg total) by mouth 2 (two) times daily for 7 days.   cloNIDine 0.2 mg/24hr patch Commonly known as: CATAPRES - Dosed in mg/24 hr Place 0.2 mg onto the skin once a week.   Ensure Max Protein Liqd Take 330 mLs (11 oz total) by mouth 2 (two) times daily.   (feeding supplement) PROSource Plus liquid Take 30 mLs by mouth daily.   gabapentin 800 MG tablet Commonly known as: NEURONTIN Take 800 mg by mouth 3 (three) times daily.   hydrALAZINE 50 MG tablet Commonly known as: APRESOLINE Take 1 tablet (50 mg total) by mouth 3 (three) times daily.   HYDROcodone-acetaminophen 5-325 MG tablet Commonly known as: NORCO/VICODIN Take 1 tablet by mouth every 6 (six) hours as needed for moderate pain.   insulin aspart protamine- aspart (70-30) 100 UNIT/ML injection Commonly known as: NOVOLOG MIX 70/30 Inject 60 Units into the skin 2 (two) times daily with a meal.   isosorbide mononitrate 30 MG 24 hr tablet Commonly known as: IMDUR Take 1 tablet (30 mg total) by mouth daily.   methocarbamol 500 MG tablet Commonly known as: ROBAXIN Take 1 tablet (500 mg total) by mouth 2 (two) times daily.   MULTIVITAMIN ADULTS PO Take 1 tablet by mouth daily.   polyethylene glycol 17 g packet Commonly known as: MIRALAX / GLYCOLAX Take 17 g by mouth daily.            Discharge Care Instructions  (From admission, onward)         Start     Ordered   10/31/20 0000  Leave dressing on - Keep it clean, dry, and intact until clinic  visit        10/31/20 1441          Discharge Exam: Filed Weights   10/23/20 0934 10/25/20 0820  Weight: 112 kg 112 kg   Vitals:   10/31/20 0914 10/31/20 1248  BP: (!) 151/79 (!) 154/83  Pulse: 98 92  Resp: 18 18  Temp: 98 F (36.7 C) 97.8 F (36.6 C)  SpO2: 96% 99%   General: Appear in no distress, no Rash; Oral Mucosa Clear, moist. no Abnormal Neck Mass Or lumps, Conjunctiva normal  Cardiovascular: S1 and S2 Present, no Murmur Respiratory: good respiratory effort, Bilateral Air entry present and CTA, no Crackles, no wheezes Abdomen: Bowel Sound present, Soft and no tenderness Extremities: Bilateral pedal edema Neurology: alert and oriented to time, place, and person affect appropriate. no new focal deficit  The results of significant  diagnostics from this hospitalization (including imaging, microbiology, ancillary and laboratory) are listed below for reference.    Significant Diagnostic Studies: DG Knee 1-2 Views Right  Result Date: 10/29/2020 CLINICAL DATA:  Pain and swelling EXAM: RIGHT KNEE - 1-2 VIEW COMPARISON:  None. FINDINGS: Frontal and lateral views obtained. No evident fracture or dislocation. No joint effusion. Joint spaces appear normal. No erosive change. IMPRESSION: No fracture, dislocation, or joint effusion. No evident arthropathy. Electronically Signed   By: Lowella Grip III M.D.   On: 10/29/2020 11:01   MR FOOT LEFT WO CONTRAST  Result Date: 10/24/2020 CLINICAL DATA:  Diabetic patient with a skin ulceration on the left great toe EXAM: MRI OF THE LEFT FOOT WITHOUT CONTRAST TECHNIQUE: Multiplanar, multisequence MR imaging of the left foot was performed. No intravenous contrast was administered. COMPARISON:  Plain films left great toe 10/23/2020. FINDINGS: Bones/Joint/Cartilage Marrow edema is seen throughout the distal and phalanx of the great toe and the tuft of the distal phalanx appears mildly fragmented. No other evidence of osteomyelitis is  identified. The patient has degenerative change at the first MTP joint with secondary marrow edema in the tibial sesamoid and overlying head of the first metatarsal. No fracture stress change is identified. No joint effusion. Ligaments Intact. Muscles and Tendons No intramuscular fluid collection or mass. There is some fatty atrophy of intrinsic musculature of the foot. Soft tissues No abscess is identified. Skin ulceration and soft tissue swelling great toe noted. IMPRESSION: Findings consistent with osteomyelitis throughout the distal phalanx of the great toe. Negative for septic joint or myositis. First MTP osteoarthritis. Electronically Signed   By: Inge Rise M.D.   On: 10/24/2020 09:34   DG Toe Great Left  Result Date: 10/23/2020 CLINICAL DATA:  Pain, swelling left great toe EXAM: LEFT GREAT TOE COMPARISON:  None. FINDINGS: Lucency in the tip of the left great toe distal phalanx concerning for osteomyelitis. No fracture, subluxation or dislocation. IMPRESSION: Lucency in the left great toe distal phalanx tip concerning for osteomyelitis. Electronically Signed   By: Rolm Baptise M.D.   On: 10/23/2020 10:52   VAS [place] ABI WITH/WO TBI  Result Date: 10/24/2020 LOWER EXTREMITY DOPPLER STUDY Indications: Ulceration. High Risk Factors: Hypertension, hyperlipidemia, Diabetes.  Limitations: Today's exam was limited due to rapid heart rate and edema in              ankles. Comparison Study: No prior study Performing Technologist: Sharion Dove RVS  Examination Guidelines: A complete evaluation includes at minimum, Doppler waveform signals and systolic blood pressure reading at the level of bilateral brachial, anterior tibial, and posterior tibial arteries, when vessel segments are accessible. Bilateral testing is considered an integral part of a complete examination. Photoelectric Plethysmograph (PPG) waveforms and toe systolic pressure readings are included as required and additional duplex testing as  needed. Limited examinations for reoccurring indications may be performed as noted.  ABI Findings: +---------+------------------+-----+-----------+--------+ Right    Rt Pressure (mmHg)IndexWaveform   Comment  +---------+------------------+-----+-----------+--------+ Brachial 178                    multiphasic         +---------+------------------+-----+-----------+--------+ PTA      189               1.06 multiphasic         +---------+------------------+-----+-----------+--------+ DP       141               0.79 multiphasic         +---------+------------------+-----+-----------+--------+  Great Toe129               0.72                     +---------+------------------+-----+-----------+--------+ +---------+------------------+-----+-----------+-------------------------------+ Left     Lt Pressure (mmHg)IndexWaveform   Comment                         +---------+------------------+-----+-----------+-------------------------------+ Brachial 177                    multiphasic                                +---------+------------------+-----+-----------+-------------------------------+ PTA      181               1.02 multiphasic                                +---------+------------------+-----+-----------+-------------------------------+ DP       180               1.01 multiphasic                                +---------+------------------+-----+-----------+-------------------------------+ Great Toe119               0.67            2nd toe pressure, great toe                                                with ulcer                      +---------+------------------+-----+-----------+-------------------------------+ +-------+-----------+-----------+------------+------------+ ABI/TBIToday's ABIToday's TBIPrevious ABIPrevious TBI +-------+-----------+-----------+------------+------------+ Right  1.06       0.72                                 +-------+-----------+-----------+------------+------------+ Left   1.02       0.67                                +-------+-----------+-----------+------------+------------+  Summary: Right: Resting right ankle-brachial index is within normal range. No evidence of significant right lower extremity arterial disease. The right toe-brachial index is normal. Left: Resting left ankle-brachial index is within normal range. No evidence of significant left lower extremity arterial disease. The left toe-brachial index is normal. TBI done on 2nd toe secondary to great toe ulceration.  *See table(s) above for measurements and observations.  Electronically signed by Jamelle Haring on 10/24/2020 at 5:51:14 PM.    Final    VAS [place] LOWER EXTREMITY VENOUS (DVT)  Result Date: 11/01/2020  Lower Venous DVT Study Indications: Edema.  Risk Factors: Surgery Post partial LLE great toe amputation due to infected DM ulcer/osteomyelitis. Limitations: Body habitus and edema. Comparison Study: No previous exams Performing Technologist: Rogelia Rohrer  Examination Guidelines: A complete evaluation includes B-mode imaging, spectral Doppler, color Doppler, and power Doppler as needed of all accessible portions of each vessel. Bilateral testing is considered an integral  part of a complete examination. Limited examinations for reoccurring indications may be performed as noted. The reflux portion of the exam is performed with the patient in reverse Trendelenburg.  +---------+---------------+---------+-----------+----------+--------------+ RIGHT    CompressibilityPhasicitySpontaneityPropertiesThrombus Aging +---------+---------------+---------+-----------+----------+--------------+ CFV      Full           Yes      Yes                                 +---------+---------------+---------+-----------+----------+--------------+ SFJ      Full                                                         +---------+---------------+---------+-----------+----------+--------------+ FV Prox  Full           Yes      Yes                                 +---------+---------------+---------+-----------+----------+--------------+ FV Mid   Full           Yes      Yes                                 +---------+---------------+---------+-----------+----------+--------------+ FV DistalFull           Yes      Yes                                 +---------+---------------+---------+-----------+----------+--------------+ PFV      Full                                                        +---------+---------------+---------+-----------+----------+--------------+ POP      Full           Yes      Yes                                 +---------+---------------+---------+-----------+----------+--------------+ PTV      Full                                                        +---------+---------------+---------+-----------+----------+--------------+ PERO     Full                                                        +---------+---------------+---------+-----------+----------+--------------+   +---------+---------------+---------+-----------+----------+-------------------+ LEFT     CompressibilityPhasicitySpontaneityPropertiesThrombus Aging      +---------+---------------+---------+-----------+----------+-------------------+ CFV      Full           Yes  Yes                                      +---------+---------------+---------+-----------+----------+-------------------+ SFJ      Full                                                             +---------+---------------+---------+-----------+----------+-------------------+ FV Prox  Full           Yes      Yes                                      +---------+---------------+---------+-----------+----------+-------------------+ FV Mid   None           No       No                   Acute                +---------+---------------+---------+-----------+----------+-------------------+ FV DistalNone           No       No                   Acute               +---------+---------------+---------+-----------+----------+-------------------+ PFV      Full                                                             +---------+---------------+---------+-----------+----------+-------------------+ POP      Partial        Yes      Yes                  Acute               +---------+---------------+---------+-----------+----------+-------------------+ PTV                                                   Not well visualized +---------+---------------+---------+-----------+----------+-------------------+ PERO                                                  Not well visualized +---------+---------------+---------+-----------+----------+-------------------+     Summary: RIGHT: - There is no evidence of deep vein thrombosis in the lower extremity.  - A cystic structure is found in the popliteal fossa.  LEFT: - Findings consistent with acute deep vein thrombosis involving the left femoral vein, and left popliteal vein.  *See table(s) above for measurements and observations. Electronically signed by Harold Barban MD on 11/01/2020 at 9:09:44 PM.    Final     Microbiology: Recent Results (from the past 240 hour(s))  Culture, blood (routine x 2)  Status: None   Collection Time: 10/24/20 12:51 PM   Specimen: BLOOD  Result Value Ref Range Status   Specimen Description BLOOD RIGHT ANTECUBITAL  Final   Special Requests   Final    BOTTLES DRAWN AEROBIC AND ANAEROBIC Blood Culture adequate volume   Culture   Final    NO GROWTH 5 DAYS Performed at [facility], [address], [place], [place] [address]    Report Status 10/29/2020 FINAL  Final  Culture, blood (routine x 2)     Status: None   Collection Time: 10/24/20 12:51 PM   Specimen: BLOOD LEFT HAND  Result Value Ref Range  Status   Specimen Description BLOOD LEFT HAND  Final   Special Requests   Final    BOTTLES DRAWN AEROBIC AND ANAEROBIC Blood Culture adequate volume   Culture   Final    NO GROWTH 5 DAYS Performed at [facility], [place] [address], [place], [place] [address]    Report Status 10/29/2020 FINAL  Final  Aerobic/Anaerobic Culture w Gram Stain (surgical/deep wound)     Status: None   Collection Time: 10/25/20  9:14 AM   Specimen: PATH Digit amputation; Tissue  Result Value Ref Range Status   Specimen Description TISSUE LEFT TOE SPEC A  Final   Special Requests LEFT TOE  Final   Gram Stain   Final    RARE WBC PRESENT,BOTH PMN AND MONONUCLEAR FEW GRAM POSITIVE COCCI IN PAIRS RARE GRAM NEGATIVE RODS    Culture   Final    FEW PROTEUS MIRABILIS FEW STAPHYLOCOCCUS AUREUS FEW STREPTOCOCCUS GROUP G Beta hemolytic streptococci are predictably susceptible to penicillin and other beta lactams. Susceptibility testing not routinely performed. FEW DIPHTHEROIDS(CORYNEBACTERIUM SPECIES) Standardized susceptibility testing for this organism is not available. PREVOTELLA SPECIES BETA LACTAMASE POSITIVE Performed at [facility], [place] [address], [place], [place] [address]    Report Status 11/01/2020 FINAL  Final   Organism ID, Bacteria PROTEUS MIRABILIS  Final   Organism ID, Bacteria STAPHYLOCOCCUS AUREUS  Final      Susceptibility   Proteus mirabilis - MIC*    AMPICILLIN <=2 SENSITIVE Sensitive     CEFAZOLIN 8 SENSITIVE Sensitive     CEFEPIME <=0.12 SENSITIVE Sensitive     CEFTAZIDIME <=1 SENSITIVE Sensitive     CEFTRIAXONE <=0.25 SENSITIVE Sensitive     CIPROFLOXACIN <=0.25 SENSITIVE Sensitive     GENTAMICIN <=1 SENSITIVE Sensitive     IMIPENEM 2 SENSITIVE Sensitive     TRIMETH/SULFA <=20 SENSITIVE Sensitive     AMPICILLIN/SULBACTAM <=2 SENSITIVE Sensitive     PIP/TAZO <=4 SENSITIVE Sensitive     * FEW PROTEUS MIRABILIS   Staphylococcus aureus - MIC*    CIPROFLOXACIN <=0.5  SENSITIVE Sensitive     ERYTHROMYCIN <=0.25 SENSITIVE Sensitive     GENTAMICIN <=0.5 SENSITIVE Sensitive     OXACILLIN <=0.25 SENSITIVE Sensitive     TETRACYCLINE <=1 SENSITIVE Sensitive     VANCOMYCIN <=0.5 SENSITIVE Sensitive     TRIMETH/SULFA <=10 SENSITIVE Sensitive     CLINDAMYCIN <=0.25 SENSITIVE Sensitive     RIFAMPIN <=0.5 SENSITIVE Sensitive     Inducible Clindamycin NEGATIVE Sensitive     * FEW STAPHYLOCOCCUS AUREUS  MRSA PCR Screening     Status: None   Collection Time: 10/27/20  7:03 AM   Specimen: Nasopharyngeal  Result Value Ref Range Status   MRSA by PCR NEGATIVE NEGATIVE Final    Comment:        The  GeneXpert MRSA Assay (FDA approved for NASAL specimens only), is one component of a comprehensive MRSA colonization surveillance program. It is not intended to diagnose MRSA infection nor to guide or monitor treatment for MRSA infections. Performed at [facility], [place] [address], [place], [place] [address]      Labs: CBC: Recent Labs  Lab 10/28/20 0239 10/29/20 0921 10/30/20 0951 10/31/20 0319  WBC 15.0* 10.3 13.7* 13.0*  NEUTROABS  --  7.4  --   --   HGB 10.8* 10.7* 10.5* 10.6*  HCT 30.9* 29.6* 30.2* 30.1*  MCV 83.3 82.2 84.1 84.6  PLT 430* 456* 503* 953*   Basic Metabolic Panel: Recent Labs  Lab 10/28/20 0239 10/29/20 0921 10/30/20 0951 10/31/20 0319  NA 130* 129* 129* 133*  K 3.6 3.9 4.4 4.3  CL 96* 95* 96* 98  CO2 _0 GLUCOSE 218* 382* 339* 269*  BUN 20 24* 27* 25*  CREATININE 1.44* 1.50* 1.52* 1.42*  CALCIUM 8.1* 8.1* 8.7* 8.9  MG  --   --   --  1.9   Liver Function Tests: No results for input(s): AST, ALT, ALKPHOS, BILITOT, PROT, ALBUMIN in the last 168 hours. CBG: Recent Labs  Lab 10/30/20 1625 10/30/20 2025 10/31/20 0637 10/31/20 1246 10/31/20 1613  GLUCAP 234* 212* 224* 263* 192*    Time spent: 35 minutes  Signed:  Berle Mull  [facility] 10/31/2020 8:10 AM

## 2020-11-10 ENCOUNTER — Ambulatory Visit: Payer: Medicare Other | Admitting: Podiatry

## 2020-11-17 ENCOUNTER — Other Ambulatory Visit: Payer: Self-pay

## 2020-11-17 ENCOUNTER — Encounter: Payer: Self-pay | Admitting: Podiatry

## 2020-11-17 ENCOUNTER — Ambulatory Visit: Payer: Medicare Other | Admitting: Podiatry

## 2020-11-17 DIAGNOSIS — Z89412 Acquired absence of left great toe: Secondary | ICD-10-CM | POA: Diagnosis not present

## 2020-11-17 DIAGNOSIS — Z961 Presence of intraocular lens: Secondary | ICD-10-CM | POA: Insufficient documentation

## 2020-11-17 DIAGNOSIS — E11621 Type 2 diabetes mellitus with foot ulcer: Secondary | ICD-10-CM | POA: Diagnosis not present

## 2020-11-17 DIAGNOSIS — H4310 Vitreous hemorrhage, unspecified eye: Secondary | ICD-10-CM | POA: Insufficient documentation

## 2020-11-17 DIAGNOSIS — Z89419 Acquired absence of unspecified great toe: Secondary | ICD-10-CM | POA: Insufficient documentation

## 2020-11-17 DIAGNOSIS — L97529 Non-pressure chronic ulcer of other part of left foot with unspecified severity: Secondary | ICD-10-CM | POA: Diagnosis not present

## 2020-11-17 DIAGNOSIS — E1139 Type 2 diabetes mellitus with other diabetic ophthalmic complication: Secondary | ICD-10-CM | POA: Insufficient documentation

## 2020-11-17 DIAGNOSIS — I82409 Acute embolism and thrombosis of unspecified deep veins of unspecified lower extremity: Secondary | ICD-10-CM | POA: Insufficient documentation

## 2020-11-17 DIAGNOSIS — I872 Venous insufficiency (chronic) (peripheral): Secondary | ICD-10-CM | POA: Insufficient documentation

## 2020-11-17 MED ORDER — SILVER SULFADIAZINE 1 % EX CREA: TOPICAL_CREAM | CUTANEOUS | 0 refills | Status: AC

## 2020-11-17 NOTE — Progress Notes (Signed)
  Subjective:  Patient ID: TEL HEVIA, male    DOB: [birth year],  MRN: [id number]  Chief Complaint  Patient presents with  . Routine Post Op      (np) Patient had Sx 3/6 by Dr. Susa Simmonds, was told to come here for follow up (no referral in chart)     DOS: 10/25/20 Procedure: Left great toe amputation (Dr. Susa Simmonds)  70 y.o. male presents with the above complaint. History confirmed with patient. States the wound had drainage and he changed it yesterday.  Objective:  Physical Exam: no tenderness at the surgical site, local edema noted and calf supple, nontender. Incision: slow healing, slight clear drainage present, dehiscence present partial at medial aspect  Assessment:   1. Ulcer of left great toe due to diabetes mellitus (HCC)   2. History of amputation of left great toe [facility])     Plan:  Patient was evaluated and treated and all questions answered.  Post-operative State -Ok to start showering at this time. Advised they cannot soak. -Dressing applied consisting of silvadene and foam border dressing. -WBAT in Surgical shoe -Surgical shoe dispensed  Return in about 10 days (around 11/27/2020).

## 2020-11-27 ENCOUNTER — Ambulatory Visit (INDEPENDENT_AMBULATORY_CARE_PROVIDER_SITE_OTHER): Payer: Non-veteran care

## 2020-11-27 ENCOUNTER — Telehealth: Payer: Self-pay

## 2020-11-27 ENCOUNTER — Other Ambulatory Visit: Payer: Self-pay

## 2020-11-27 ENCOUNTER — Other Ambulatory Visit (HOSPITAL_BASED_OUTPATIENT_CLINIC_OR_DEPARTMENT_OTHER): Payer: Self-pay

## 2020-11-27 ENCOUNTER — Ambulatory Visit (INDEPENDENT_AMBULATORY_CARE_PROVIDER_SITE_OTHER): Payer: Non-veteran care | Admitting: Podiatry

## 2020-11-27 DIAGNOSIS — L97523 Non-pressure chronic ulcer of other part of left foot with necrosis of muscle: Secondary | ICD-10-CM | POA: Diagnosis not present

## 2020-11-27 DIAGNOSIS — E11621 Type 2 diabetes mellitus with foot ulcer: Secondary | ICD-10-CM

## 2020-11-27 MED ORDER — SANTYL 250 UNIT/GM EX OINT
TOPICAL_OINTMENT | CUTANEOUS | 0 refills | Status: DC
  Filled 2020-11-27: qty 30, 14d supply, fill #0

## 2020-11-27 NOTE — Telephone Encounter (Signed)
Orders for wound measuring 2.5 cm x 1.5 cm x 3 cm faxed to [facility] with demographics and office note from today.Includes: Collagen daily Silicone bordered foam  dressing daily Antimicrobial roll gauze daily Sterile gauze 4 x 4 daily Tape, 2 inch daily saline

## 2020-11-27 NOTE — Progress Notes (Signed)
  Subjective:  Patient ID: Richard Russell, male    DOB: March 02, 1951,  MRN: [id number]  Chief Complaint  Patient presents with  . Diabetic Ulcer     10 day follow up/diabetic ulcer left   DOS: 10/25/20 Procedure: Left great toe amputation (Dr. Susa Simmonds)  70 y.o. male presents with the above complaint. History confirmed with patient. Thinks the wound is doing ok denies new known issues.  Objective:  Physical Exam: no tenderness at the surgical site, local edema noted and calf supple, nontender. Incision: worsening dehiscence with 2.5x1.5, 2x1.5 with 82mm depth. No probe to bone  XR without osteomyelitis, gas, erosion.  Assessment:   1. 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 Diabetic ulcer of toe of left foot associated with type 2 diabetes mellitus, with necrosis of muscle (HCC)    Plan:  Patient was evaluated and treated and all questions answered.  Post-operative State -XR reviewed with patient -Dressing applied consisting of prisma packed into wound, foam border dressing, sterile gauze, kerlix and coban -Offload ulcer with surgical shoe  -Order santyl for enzymatic debridement -Order wound care supplies for patient.  -Discuss if wound worsens he may need debridement and closure.  Return in about 2 weeks (around 12/11/2020).

## 2020-11-30 ENCOUNTER — Other Ambulatory Visit (HOSPITAL_BASED_OUTPATIENT_CLINIC_OR_DEPARTMENT_OTHER): Payer: Self-pay

## 2020-12-01 ENCOUNTER — Telehealth: Payer: Self-pay | Admitting: *Deleted

## 2020-12-01 NOTE — Telephone Encounter (Signed)
Richard Russell w/ [facility]  is requesting a copy of insurance card faxed to complete order for patient. May be faxed to [phone].   Faxed/confirmed information requested to number listed 12/01/20.

## 2020-12-07 ENCOUNTER — Other Ambulatory Visit (HOSPITAL_BASED_OUTPATIENT_CLINIC_OR_DEPARTMENT_OTHER): Payer: Self-pay

## 2020-12-11 ENCOUNTER — Other Ambulatory Visit: Payer: Self-pay

## 2020-12-11 ENCOUNTER — Ambulatory Visit (INDEPENDENT_AMBULATORY_CARE_PROVIDER_SITE_OTHER): Payer: Non-veteran care | Admitting: Podiatry

## 2020-12-11 ENCOUNTER — Ambulatory Visit: Payer: Self-pay | Admitting: Podiatry

## 2020-12-11 DIAGNOSIS — L97523 Non-pressure chronic ulcer of other part of left foot with necrosis of muscle: Secondary | ICD-10-CM | POA: Insufficient documentation

## 2020-12-11 DIAGNOSIS — E11621 Type 2 diabetes mellitus with foot ulcer: Secondary | ICD-10-CM

## 2020-12-11 NOTE — Progress Notes (Addendum)
Left voice mail message for Richard Russell to ask dr price is pt to stop eliquis for 12-16-2020 surgery.  Spoke with Richard Russell pt is to stay on eliquis per dr price.  Spoke w/ via phone for pre-op interview---pt Lab needs dos----  I stat  and    ekg         Lab results------see below COVID test ------ Arrive at -------1230 pm 12-16-2020 NPO after MN NO Solid Food.  Clear liquids from MN until---  1130 am then npo Med rec completed Medications to take morning of surgery ----- eliquis per dr price instructions, hydrazaline, isosorbide mononitrate, amlodipine, atorvastatin, clonidine patch, gabapentin, gabapentin, metorprolol tartate Diabetic medication -----none day of sugery Patient instructed to bring photo id and insurance card day of surgery Patient aware to have Driver (ride ) / caregiver daughter Richard Russell    for 24 hours after surgery  Patient Special Instructions -----take 1/2 dose of pm 70/30 insulin on 12-15-2020 (take 30 units) Pre-Op special Istructions -----none Patient verbalized understanding of instructions that were given at this phone interview. Patient denies shortness of breath, chest pain, fever, cough at this phone interview.  Echo 03-03-2015 epic Pt going to [place] 12-14-2020 to get h and p done Pt staying on 81 mg aspirin per dr price last dose will be 12-15-2020

## 2020-12-14 ENCOUNTER — Telehealth: Payer: Self-pay | Admitting: Urology

## 2020-12-14 ENCOUNTER — Encounter (HOSPITAL_BASED_OUTPATIENT_CLINIC_OR_DEPARTMENT_OTHER): Payer: Self-pay | Admitting: Podiatry

## 2020-12-14 ENCOUNTER — Other Ambulatory Visit: Payer: Self-pay

## 2020-12-14 NOTE — Progress Notes (Signed)
  Subjective:  Patient ID: Richard Russell, male    DOB: June 20, 1951,  MRN: [id number]  Chief Complaint  Patient presents with  . Wound Check    Pt states finished antibiotics, denies any new concerns "it is the same". Denies fever/chills/nausea/vomiting.   DOS: 10/25/20 Procedure: Left great toe amputation (Dr. Susa Simmonds)  70 y.o. male presents with the above complaint. History confirmed with patient. Does not think the wound is improving.  Objective:  Physical Exam: no tenderness at the surgical site, local edema noted and calf supple, nontender. Incision: worsening dehiscence with 2.5x1.5, 2x1.5 with 26mm depth. No probe to bone  Assessment:   1. 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 Diabetic ulcer of toe of left foot associated with type 2 diabetes mellitus, with necrosis of muscle (HCC)    Plan:  Patient was evaluated and treated and all questions answered.  Post-operative State -Appears worsened today, more fibrotic -Minimal debridement today.  -Encouraged to restart santyl -Would benefit from operative debridement and closure -Patient has failed all conservative therapy and wishes to proceed with surgical intervention. All risks, benefits, and alternatives discussed with patient. No guarantees given. Consent reviewed and signed by patient. -Planned procedures: left foot wound repair of wound dehiscence. -ASA 3 - Patient with moderate systemic disease with functional limitations  -Wound culture performed today.    Return for Post-op Care.

## 2020-12-14 NOTE — Telephone Encounter (Addendum)
DOS - 12/16/20  WOUND DEBRIDEMENT AND CLOSURE - 13160  [employer] EFFECTIVE DATE - 05/12/20  PER [employer] WEBSITE FOR CPT CODE 92010 Notification or Prior Authorization is not required for the requested services  Decision ID #:[id number]

## 2020-12-15 LAB — WOUND CULTURE
MICRO NUMBER:: 11803773
SPECIMEN QUALITY:: ADEQUATE

## 2020-12-16 ENCOUNTER — Ambulatory Visit (HOSPITAL_BASED_OUTPATIENT_CLINIC_OR_DEPARTMENT_OTHER): Admission: RE | Admit: 2020-12-16 | Payer: Medicare Other | Source: Home / Self Care | Admitting: Podiatry

## 2020-12-16 DIAGNOSIS — L97523 Non-pressure chronic ulcer of other part of left foot with necrosis of muscle: Secondary | ICD-10-CM

## 2020-12-16 HISTORY — DX: Polyneuropathy, unspecified: G62.9

## 2020-12-16 HISTORY — DX: Presence of spectacles and contact lenses: Z97.3

## 2020-12-16 HISTORY — DX: Presence of dental prosthetic device (complete) (partial): Z97.2

## 2020-12-16 HISTORY — DX: Sleep apnea, unspecified: G47.30

## 2020-12-16 HISTORY — DX: Disorder of the skin and subcutaneous tissue, unspecified: L98.9

## 2020-12-16 SURGERY — DEBRIDEMENT, WOUND
Anesthesia: Monitor Anesthesia Care | Laterality: Left

## 2020-12-17 ENCOUNTER — Other Ambulatory Visit (HOSPITAL_BASED_OUTPATIENT_CLINIC_OR_DEPARTMENT_OTHER): Payer: Self-pay

## 2020-12-17 MED ORDER — CIPROFLOXACIN HCL 500 MG PO TABS
500.0000 mg | ORAL_TABLET | Freq: Two times a day (BID) | ORAL | 0 refills | Status: AC
  Filled 2020-12-17: qty 20, 10d supply, fill #0

## 2020-12-17 NOTE — Addendum Note (Signed)
Addended by: Ventura Sellers on: 12/17/2020 02:34 PM   Modules accepted: Orders

## 2020-12-22 ENCOUNTER — Encounter: Payer: Non-veteran care | Admitting: Podiatry

## 2020-12-29 ENCOUNTER — Ambulatory Visit: Payer: Non-veteran care | Admitting: Podiatry

## 2020-12-29 ENCOUNTER — Ambulatory Visit (INDEPENDENT_AMBULATORY_CARE_PROVIDER_SITE_OTHER): Payer: No Typology Code available for payment source | Admitting: Podiatry

## 2020-12-29 ENCOUNTER — Other Ambulatory Visit: Payer: Self-pay

## 2020-12-29 DIAGNOSIS — L97523 Non-pressure chronic ulcer of other part of left foot with necrosis of muscle: Secondary | ICD-10-CM

## 2020-12-29 DIAGNOSIS — E11621 Type 2 diabetes mellitus with foot ulcer: Secondary | ICD-10-CM

## 2020-12-29 NOTE — Progress Notes (Signed)
  Subjective:  Patient ID: Richard Russell, male    DOB: 06/28/1951,  MRN: [id number]  Chief Complaint  Patient presents with  . Wound Check    Denies any f/c/n/v. Minimal drainage of bandage. Odor.    DOS: 10/25/20 Procedure: Left great toe amputation (Dr. Susa Simmonds)  70 y.o. male presents with the above complaint. History confirmed with patient. Does not think the wound is improving.  Objective:  Physical Exam: no tenderness at the surgical site, local edema noted and calf supple, nontender. Incision: worsening dehiscence with 2x1 medial, 1.5x1 lateral with 83mm depth. No probe to bone  Assessment:   1. 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 Diabetic ulcer of toe of left foot associated with type 2 diabetes mellitus, with necrosis of muscle (HCC)    Plan:  Patient was evaluated and treated and all questions answered.  Ulcer, left great toe wound dehiscence -Slow to heal, but less depth -Wounds debrided to viable tissue -Dressed with betadine WTD -Pending surgery next week to clean and close wounds  Procedure: Selective Debridement of Wound Rationale: Removal of devitalized tissue from the wound to promote healing.  Pre-Debridement Wound Measurements: 2x1, 1.5x1  Post-Debridement Wound Measurements: same as pre-debridement. Type of Debridement: sharp selective Instrumentation: dermal curette, tissue nipper Tissue Removed: Devitalized soft-tissue Dressing: Dry, sterile, compression dressing. Disposition: Patient tolerated procedure well.    No follow-ups on file.

## 2020-12-29 NOTE — H&P (View-Only) (Signed)
  Subjective:  Patient ID: Richard Russell, male    DOB: 06/25/1951,  MRN: [id number]  Chief Complaint  Patient presents with  . Wound Check    Denies any f/c/n/v. Minimal drainage of bandage. Odor.    DOS: 10/25/20 Procedure: Left great toe amputation (Dr. Adair)  70 y.o. male presents with the above complaint. History confirmed with patient. Does not think the wound is improving.  Objective:  Physical Exam: no tenderness at the surgical site, local edema noted and calf supple, nontender. Incision: worsening dehiscence with 2x1 medial, 1.5x1 lateral with 4mm depth. No probe to bone  Assessment:   1. 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 Diabetic ulcer of toe of left foot associated with type 2 diabetes mellitus, with necrosis of muscle (HCC)    Plan:  Patient was evaluated and treated and all questions answered.  Ulcer, left great toe wound dehiscence -Slow to heal, but less depth -Wounds debrided to viable tissue -Dressed with betadine WTD -Pending surgery next week to clean and close wounds  Procedure: Selective Debridement of Wound Rationale: Removal of devitalized tissue from the wound to promote healing.  Pre-Debridement Wound Measurements: 2x1, 1.5x1  Post-Debridement Wound Measurements: same as pre-debridement. Type of Debridement: sharp selective Instrumentation: dermal curette, tissue nipper Tissue Removed: Devitalized soft-tissue Dressing: Dry, sterile, compression dressing. Disposition: Patient tolerated procedure well.    No follow-ups on file.  

## 2020-12-30 NOTE — Progress Notes (Signed)
LEFT MESSAGE WITH Richard Russell, IS PATIENT TO STAY ON ELIQUIS FOR 01-06-2021, PLEASE ADVISE.

## 2020-12-31 NOTE — Progress Notes (Addendum)
Left voice mail message for Richard Russell to ask dr price is pt to stop eliquis for 01-06-2021 surgery.  Spoke with  Richard Russell pt is to stay on eliquis per dr price.  Spoke w/ via phone for pre-op interview---pt Lab needs dos----  I stat  and    ekg         Lab results------see below COVID test ------01-05-2021 1000 am Arrive at -------630 am 01-06-2021 NPO after MN NO Solid Food.  Clear liquids from MN until---   530 am then npo Med rec completed Medications to take morning of surgery ----- eliquis per dr price instructions, hydrazaline, isosorbide mononitrate, amlodipine, atorvastatin, clonidine patch, gabapentin,  metorprolol tartate Diabetic medication -----none day of sugery Patient instructed to bring photo id and insurance card day of surgery Patient aware to have Driver (ride ) / caregiver daughter gerome kokesh    for 24 hours after surgery  Patient Special Instructions -----take 1/2 dose of pm 70/30 insulin on  5-17--2022 (take 30 units) Pre-Op special Istructions -----none Patient verbalized understanding of instructions that were given at this phone interview. Patient denies shortness of breath, chest pain, fever, cough at this phone interview.  Echo 03-03-2015 epic Pt staying on 81 mg aspirin per dr price last dose will be 01-05-2021  H & P dr Aldona Bar dated 12-18-2020 on chart for 01-06-2021 surgery

## 2021-01-05 ENCOUNTER — Other Ambulatory Visit (HOSPITAL_COMMUNITY)
Admission: RE | Admit: 2021-01-05 | Discharge: 2021-01-05 | Disposition: A | Discharge status: Home / Self Care | Payer: No Typology Code available for payment source | Source: Ambulatory Visit | Attending: Podiatry | Admitting: Podiatry

## 2021-01-05 ENCOUNTER — Encounter: Payer: Non-veteran care | Admitting: Podiatry

## 2021-01-05 DIAGNOSIS — Z01812 Encounter for preprocedural laboratory examination: Secondary | ICD-10-CM | POA: Diagnosis present

## 2021-01-05 DIAGNOSIS — Z20822 Contact with and (suspected) exposure to covid-19: Secondary | ICD-10-CM | POA: Diagnosis not present

## 2021-01-05 LAB — SARS CORONAVIRUS 2 (TAT 6-24 HRS): SARS Coronavirus 2: NEGATIVE

## 2021-01-05 NOTE — Progress Notes (Signed)
Dr. Samuella Cota in process of putting in orders.

## 2021-01-06 ENCOUNTER — Encounter (HOSPITAL_BASED_OUTPATIENT_CLINIC_OR_DEPARTMENT_OTHER)
Admission: RE | Disposition: A | Discharge status: Home / Self Care | Payer: Self-pay | Source: Home / Self Care | Attending: Podiatry

## 2021-01-06 ENCOUNTER — Other Ambulatory Visit: Payer: Self-pay

## 2021-01-06 ENCOUNTER — Ambulatory Visit (HOSPITAL_BASED_OUTPATIENT_CLINIC_OR_DEPARTMENT_OTHER)
Admission: RE | Admit: 2021-01-06 | Discharge: 2021-01-06 | Disposition: A | Discharge status: Home / Self Care | Payer: No Typology Code available for payment source | Attending: Podiatry | Admitting: Podiatry

## 2021-01-06 ENCOUNTER — Other Ambulatory Visit (HOSPITAL_BASED_OUTPATIENT_CLINIC_OR_DEPARTMENT_OTHER): Payer: Self-pay

## 2021-01-06 ENCOUNTER — Ambulatory Visit (HOSPITAL_BASED_OUTPATIENT_CLINIC_OR_DEPARTMENT_OTHER): Payer: No Typology Code available for payment source | Admitting: Anesthesiology

## 2021-01-06 ENCOUNTER — Encounter (HOSPITAL_BASED_OUTPATIENT_CLINIC_OR_DEPARTMENT_OTHER): Payer: Self-pay | Admitting: Podiatry

## 2021-01-06 DIAGNOSIS — T8130XA Disruption of wound, unspecified, initial encounter: Secondary | ICD-10-CM

## 2021-01-06 DIAGNOSIS — E11621 Type 2 diabetes mellitus with foot ulcer: Secondary | ICD-10-CM | POA: Insufficient documentation

## 2021-01-06 DIAGNOSIS — L97523 Non-pressure chronic ulcer of other part of left foot with necrosis of muscle: Secondary | ICD-10-CM | POA: Diagnosis not present

## 2021-01-06 DIAGNOSIS — Z89412 Acquired absence of left great toe: Secondary | ICD-10-CM | POA: Diagnosis not present

## 2021-01-06 DIAGNOSIS — Y835 Amputation of limb(s) as the cause of abnormal reaction of the patient, or of later complication, without mention of misadventure at the time of the procedure: Secondary | ICD-10-CM | POA: Diagnosis not present

## 2021-01-06 DIAGNOSIS — T8781 Dehiscence of amputation stump: Secondary | ICD-10-CM | POA: Diagnosis not present

## 2021-01-06 HISTORY — PX: IRRIGATION AND DEBRIDEMENT FOOT: SHX6602

## 2021-01-06 LAB — GLUCOSE, CAPILLARY: Glucose-Capillary: 75 mg/dL (ref 70–99)

## 2021-01-06 LAB — POCT I-STAT, CHEM 8
BUN: 21 mg/dL (ref 8–23)
Calcium, Ion: 1.13 mmol/L — ABNORMAL LOW (ref 1.15–1.40)
Chloride: 106 mmol/L (ref 98–111)
Creatinine, Ser: 1.8 mg/dL — ABNORMAL HIGH (ref 0.61–1.24)
Glucose, Bld: 90 mg/dL (ref 70–99)
HCT: 32 % — ABNORMAL LOW (ref 39.0–52.0)
Hemoglobin: 10.9 g/dL — ABNORMAL LOW (ref 13.0–17.0)
Potassium: 2.8 mmol/L — ABNORMAL LOW (ref 3.5–5.1)
Sodium: 143 mmol/L (ref 135–145)
TCO2: 24 mmol/L (ref 22–32)

## 2021-01-06 SURGERY — IRRIGATION AND DEBRIDEMENT FOOT
Anesthesia: General | Site: Foot | Laterality: Left

## 2021-01-06 MED ORDER — CEPHALEXIN 500 MG PO CAPS
500.0000 mg | ORAL_CAPSULE | Freq: Two times a day (BID) | ORAL | 0 refills | Status: AC
  Filled 2021-01-06: qty 14, 7d supply, fill #0

## 2021-01-06 MED ORDER — OXYCODONE HCL 5 MG PO TABS
5.0000 mg | ORAL_TABLET | Freq: Three times a day (TID) | ORAL | 0 refills | Status: AC | PRN
  Filled 2021-01-06: qty 20, 7d supply, fill #0

## 2021-01-06 MED ORDER — BUPIVACAINE HCL (PF) 0.5 % IJ SOLN
INTRAMUSCULAR | Status: DC | PRN
  Administered 2021-01-06: 10 mL

## 2021-01-06 MED ORDER — ACETAMINOPHEN 325 MG PO TABS: 325.0000 mg | ORAL_TABLET | ORAL | Status: DC | PRN

## 2021-01-06 MED ORDER — OXYCODONE HCL 5 MG/5ML PO SOLN: 5.0000 mg | Freq: Once | ORAL | Status: DC | PRN

## 2021-01-06 MED ORDER — 0.9 % SODIUM CHLORIDE (POUR BTL) OPTIME
TOPICAL | Status: DC | PRN
  Administered 2021-01-06: 1000 mL

## 2021-01-06 MED ORDER — MIDAZOLAM HCL 2 MG/2ML IJ SOLN
INTRAMUSCULAR | Status: AC
  Filled 2021-01-06: qty 2

## 2021-01-06 MED ORDER — OXYCODONE HCL 5 MG PO TABS: 5.0000 mg | ORAL_TABLET | Freq: Once | ORAL | Status: DC | PRN

## 2021-01-06 MED ORDER — LACTATED RINGERS IV SOLN: INTRAVENOUS | Status: DC

## 2021-01-06 MED ORDER — CEFAZOLIN SODIUM-DEXTROSE 2-4 GM/100ML-% IV SOLN
INTRAVENOUS | Status: AC
  Filled 2021-01-06: qty 100

## 2021-01-06 MED ORDER — MIDAZOLAM HCL 5 MG/5ML IJ SOLN
INTRAMUSCULAR | Status: DC | PRN
  Administered 2021-01-06: 2 mg via INTRAVENOUS

## 2021-01-06 MED ORDER — FENTANYL CITRATE (PF) 100 MCG/2ML IJ SOLN
INTRAMUSCULAR | Status: DC | PRN
  Administered 2021-01-06: 25 ug via INTRAVENOUS
  Administered 2021-01-06 (×2): 12.5 ug via INTRAVENOUS

## 2021-01-06 MED ORDER — ACETAMINOPHEN 160 MG/5ML PO SOLN
325.0000 mg | ORAL | Status: DC | PRN
Start: 2021-01-06 — End: 2021-01-06

## 2021-01-06 MED ORDER — PROPOFOL 10 MG/ML IV BOLUS
INTRAVENOUS | Status: DC | PRN
  Administered 2021-01-06: 40 mg via INTRAVENOUS

## 2021-01-06 MED ORDER — ONDANSETRON HCL 4 MG/2ML IJ SOLN
INTRAMUSCULAR | Status: DC | PRN
  Administered 2021-01-06: 4 mg via INTRAVENOUS

## 2021-01-06 MED ORDER — FENTANYL CITRATE (PF) 100 MCG/2ML IJ SOLN: 25.0000 ug | INTRAMUSCULAR | Status: DC | PRN

## 2021-01-06 MED ORDER — ONDANSETRON HCL 4 MG/2ML IJ SOLN: 4.0000 mg | Freq: Once | INTRAMUSCULAR | Status: DC | PRN

## 2021-01-06 MED ORDER — CEFAZOLIN SODIUM-DEXTROSE 2-4 GM/100ML-% IV SOLN
2.0000 g | INTRAVENOUS | Status: AC
  Administered 2021-01-06: 2 g via INTRAVENOUS

## 2021-01-06 MED ORDER — KETOROLAC TROMETHAMINE 15 MG/ML IJ SOLN: 15.0000 mg | Freq: Once | INTRAMUSCULAR | Status: DC

## 2021-01-06 MED ORDER — FENTANYL CITRATE (PF) 100 MCG/2ML IJ SOLN
INTRAMUSCULAR | Status: AC
  Filled 2021-01-06: qty 2

## 2021-01-06 MED ORDER — PROPOFOL 10 MG/ML IV BOLUS
INTRAVENOUS | Status: AC
  Filled 2021-01-06: qty 20

## 2021-01-06 MED ORDER — MEPERIDINE HCL 25 MG/ML IJ SOLN: 6.2500 mg | INTRAMUSCULAR | Status: DC | PRN

## 2021-01-06 MED ORDER — ONDANSETRON HCL 4 MG/2ML IJ SOLN
INTRAMUSCULAR | Status: AC
  Filled 2021-01-06: qty 2

## 2021-01-06 SURGICAL SUPPLY — 56 items
APL PRP STRL LF DISP 70% ISPRP (MISCELLANEOUS)
BLADE SURG 15 STRL LF DISP TIS (BLADE) ×1 IMPLANT
BLADE SURG 15 STRL SS (BLADE) ×2
BNDG CMPR 9X4 STRL LF SNTH (GAUZE/BANDAGES/DRESSINGS)
BNDG ELASTIC 3X5.8 VLCR STR LF (GAUZE/BANDAGES/DRESSINGS) ×2 IMPLANT
BNDG ELASTIC 4X5.8 VLCR STR LF (GAUZE/BANDAGES/DRESSINGS) ×2 IMPLANT
BNDG ESMARK 4X9 LF (GAUZE/BANDAGES/DRESSINGS) IMPLANT
BNDG GAUZE ELAST 4 BULKY (GAUZE/BANDAGES/DRESSINGS) ×2 IMPLANT
CHLORAPREP W/TINT 26 (MISCELLANEOUS) IMPLANT
CNTNR URN SCR LID CUP LEK RST (MISCELLANEOUS) IMPLANT
CONT SPEC 4OZ STRL OR WHT (MISCELLANEOUS)
COVER BACK TABLE 60X90IN (DRAPES) ×2 IMPLANT
COVER WAND RF STERILE (DRAPES) ×2 IMPLANT
CUFF TOURN SGL QUICK 18X4 (TOURNIQUET CUFF) IMPLANT
CUFF TOURN SGL QUICK 24 (TOURNIQUET CUFF)
CUFF TRNQT CYL 24X4X16.5-23 (TOURNIQUET CUFF) IMPLANT
DRAPE 3/4 80X56 (DRAPES) ×2 IMPLANT
DRAPE EXTREMITY T 121X128X90 (DISPOSABLE) ×2 IMPLANT
DRAPE SHEET LG 3/4 BI-LAMINATE (DRAPES) ×2 IMPLANT
DRAPE U-SHAPE 47X51 STRL (DRAPES) ×2 IMPLANT
ELECT REM PT RETURN 9FT ADLT (ELECTROSURGICAL) ×2
ELECTRODE REM PT RTRN 9FT ADLT (ELECTROSURGICAL) ×1 IMPLANT
GAUZE SPONGE 4X4 12PLY STRL (GAUZE/BANDAGES/DRESSINGS) ×2 IMPLANT
GAUZE XEROFORM 1X8 LF (GAUZE/BANDAGES/DRESSINGS) IMPLANT
GLOVE SRG 8 PF TXTR STRL LF DI (GLOVE) ×1 IMPLANT
GLOVE SURG ENC MOIS LTX SZ7.5 (GLOVE) ×2 IMPLANT
GLOVE SURG UNDER POLY LF SZ8 (GLOVE) ×2
GOWN STRL REUS W/TWL XL LVL3 (GOWN DISPOSABLE) ×2 IMPLANT
KIT TURNOVER CYSTO (KITS) ×2 IMPLANT
MANIFOLD NEPTUNE II (INSTRUMENTS) ×2 IMPLANT
NEEDLE HYPO 25X1 1.5 SAFETY (NEEDLE) ×2 IMPLANT
NS IRRIG 1000ML POUR BTL (IV SOLUTION) IMPLANT
PACK BASIN DAY SURGERY FS (CUSTOM PROCEDURE TRAY) ×2 IMPLANT
PADDING CAST ABS 4INX4YD NS (CAST SUPPLIES) ×1
PADDING CAST ABS COTTON 4X4 ST (CAST SUPPLIES) ×1 IMPLANT
PENCIL SMOKE EVAC W/HOLSTER (ELECTROSURGICAL) ×2 IMPLANT
PROBE DEBRIDE SONICVAC MISONIX (TIP) ×2 IMPLANT
SET IRRIG Y TYPE TUR BLADDER L (SET/KITS/TRAYS/PACK) IMPLANT
SPONGE LAP 4X18 RFD (DISPOSABLE) ×2 IMPLANT
STAPLER VISISTAT 35W (STAPLE) IMPLANT
STOCKINETTE 6  STRL (DRAPES) ×2
STOCKINETTE 6 STRL (DRAPES) ×1 IMPLANT
SUCTION FRAZIER HANDLE 10FR (MISCELLANEOUS)
SUCTION TUBE FRAZIER 10FR DISP (MISCELLANEOUS) IMPLANT
SUT ETHILON 3 0 PS 1 (SUTURE) IMPLANT
SUT ETHILON 4 0 PS 2 18 (SUTURE) IMPLANT
SUT MNCRL AB 3-0 PS2 18 (SUTURE) IMPLANT
SUT MNCRL AB 4-0 PS2 18 (SUTURE) IMPLANT
SUT VIC AB 2-0 SH 27 (SUTURE)
SUT VIC AB 2-0 SH 27XBRD (SUTURE) IMPLANT
SYR BULB EAR ULCER 3OZ GRN STR (SYRINGE) ×2 IMPLANT
SYR CONTROL 10ML LL (SYRINGE) ×2 IMPLANT
TRAY DSU PREP LF (CUSTOM PROCEDURE TRAY) ×2 IMPLANT
TUBE IRRIGATION SET MISONIX (TUBING) IMPLANT
UNDERPAD 30X36 HEAVY ABSORB (UNDERPADS AND DIAPERS) ×2 IMPLANT
YANKAUER SUCT BULB TIP NO VENT (SUCTIONS) ×2 IMPLANT

## 2021-01-06 NOTE — Anesthesia Preprocedure Evaluation (Addendum)
Anesthesia Evaluation  Patient identified by MRN, date of birth, ID band Patient awake    Reviewed: Allergy & Precautions, NPO status , Patient's Chart, lab work & pertinent test results  History of Anesthesia Complications Negative for: history of anesthetic complications  Airway Mallampati: II  TM Distance: >3 FB Neck ROM: Full    Dental  (+) Poor Dentition, Missing, Dental Advisory Given   Pulmonary neg pulmonary ROS,  10/23/2020 SARS coronavirus NEG   breath sounds clear to auscultation       Cardiovascular hypertension, Pt. on medications (-) angina+ Peripheral Vascular Disease   Rhythm:Regular Rate:Normal  '16 ECHO: EF 55-60%, no significant valvular abnormalities   Neuro/Psych Diabetic peripheral neuropathy    GI/Hepatic negative GI ROS, Neg liver ROS,   Endo/Other  diabetes, Insulin DependentMorbid obesity  Renal/GU Renal InsufficiencyRenal disease (creat 1.84)     Musculoskeletal   Abdominal (+) + obese,   Peds  Hematology  (+) Blood dyscrasia (Hb 10.5), anemia ,   Anesthesia Other Findings   Reproductive/Obstetrics                             Anesthesia Physical  Anesthesia Plan  ASA: III  Anesthesia Plan: MAC   Post-op Pain Management:    Induction: Intravenous  PONV Risk Score and Plan: 1 and Ondansetron  Airway Management Planned: Natural Airway and Simple Face Mask  Additional Equipment: None  Intra-op Plan:   Post-operative Plan: Extubation in OR  Informed Consent: I have reviewed the patients History and Physical, chart, labs and discussed the procedure including the risks, benefits and alternatives for the proposed anesthesia with the patient or authorized representative who has indicated his/her understanding and acceptance.     Dental advisory given  Plan Discussed with: CRNA and Surgeon  Anesthesia Plan Comments: (Plan routine monitors, ankle block  with sedation)       Anesthesia Quick Evaluation

## 2021-01-06 NOTE — Transfer of Care (Addendum)
Immediate Anesthesia Transfer of Care Note  Patient: Richard Russell  Procedure(s) Performed: Procedure(s) (LRB): WOUND DEBRIDEMENT AND CLOSURE LEFT GREAT TOE (Left)  Patient Location: PACU  Anesthesia Type: MAC  Level of Consciousness: awake, sedated, patient cooperative and responds to stimulation  Airway & Oxygen Therapy: Patient Spontanous Breathing and Patient on RA with soft FM   Post-op Assessment: Report given to PACU RN, Post -op Vital signs reviewed and stable and Patient moving all extremities  Post vital signs: Reviewed and stable  Complications: No apparent anesthesia complications

## 2021-01-06 NOTE — Op Note (Signed)
  Patient Name: Richard Russell DOB: 1951-01-21  MRN: [id number]   Date of Surgery: 01/06/2021  Surgeon: Dr. Hardie Pulley, DPM Assistants: none  Pre-operative Diagnosis:  * No Diagnosis Codes entered * Post-operative Diagnosis:  * No Diagnosis Codes entered * Procedures:  1) Repair of wound dehisence Pathology/Specimens: * No specimens in log * Anesthesia: MAC Hemostasis: * No tourniquets in log * Estimated Blood Loss: 2 mL Materials: * No implants in log * Medications: 5 cc 0.5% marcaine plain Complications: none  Indications for Procedure:  This is a 70 y.o. male who previous underwent hallux amputation and experienced dehiscence post-operatively. He returns today for planned repair.   Procedure in Detail: Patient was identified in pre-operative holding area. Formal consent was signed and the left lower extremity was marked. Patient was brought back to the operating room. Anesthesia was induced. The extremity was prepped and draped in the usual sterile fashion. Timeout was taken to confirm patient name, laterality, and procedure prior to incision.   Attention was then directed to the left great toe. There was a wound about the distal aspect of the previous first digit. The wound measured 5x1.5x0.5. The wound was debrided with a 15 blade, followed by Misonix ultrasonic debrider. Debridement was carried to and including the fascial tissue. The wound was then thoroughly irrigated. The wound was closed in layers with 4-0 vicryl, 4-0 nylon, and skin staples.  The foot was then dressed with xeroform, 4x4, kerlix, and ACE bandage.. Patient tolerated the procedure well.  Disposition: Following a period of post-operative monitoring, patient will be transferred home.

## 2021-01-06 NOTE — Discharge Instructions (Signed)
  Post Anesthesia Home Care Instructions  Activity: Get plenty of rest for the remainder of the day. A responsible adult should stay with you for 24 hours following the procedure.  For the next 24 hours, DO NOT: -Drive a car -Advertising copywriter -Drink alcoholic beverages -Take any medication unless instructed by your physician -Make any legal decisions or sign important papers.  Meals: Start with liquid foods such as gelatin or soup. Progress to regular foods as tolerated. Avoid greasy, spicy, heavy foods. If nausea and/or vomiting occur, drink only clear liquids until the nausea and/or vomiting subsides. Call your physician if vomiting continues.  Special Instructions/Symptoms: Your throat may feel dry or sore from the anesthesia or the breathing tube placed in your throat during surgery. If this causes discomfort, gargle with warm salt water. The discomfort should disappear within 24 hours.         After Surgery Instructions   1) If you are recuperating from surgery anywhere other than home, please be sure to leave [place] the number where you can be reached.  2) Go directly home and rest.  3) Keep the operated foot(feet) elevated six inches above the hip when sitting or lying down. This will help control swelling and pain.  4) Support the elevated foot and leg with pillows. DO NOT PLACE PILLOWS UNDER THE KNEE.  5) DO NOT REMOVE or get your bandages WET, unless you were given different instructions by your doctor to do so. This increases the risk of infection.  6) Wear your surgical shoe or surgical boot at all times when you are up on your feet.  7) A limited amount of pain and swelling may occur. The skin may take on a bruised appearance. DO NOT BE ALARMED, THIS IS NORMAL.  8) For slight pain and swelling, apply an ice pack directly over the bandages for 15 minutes only out of each hour of the day. Continue until seen in the office for your first post op visit. DO NOT APPLY ANY  FORM OF HEAT TO THE AREA.  9) Have prescriptions filled immediately and take as directed.  10) Drink lots of liquids, water and juice to stay hydrated.  11) CALL IMMEDIATELY IF:  *Bleeding continues until the following day of surgery  *Pain increases and/or does not respond to medication  *Bandages or cast appears to tight  *If your bandage gets wet  *Trip, fall or stump your surgical foot  *If your temperature goes above 101  *If you have ANY questions at all  12) You are expected to be weightbearing after your surgery.   If you need to reach the nurse for any reason, please call: [place]: [phone] [place]: [phone] [place]: [phone]

## 2021-01-06 NOTE — H&P (Signed)
Anesthesia H&P Update: History and Physical Exam reviewed; patient is OK for planned anesthetic and procedure. ? ?

## 2021-01-06 NOTE — Anesthesia Postprocedure Evaluation (Addendum)
Anesthesia Post Note  Patient: Richard Russell  Procedure(s) Performed: WOUND DEBRIDEMENT AND CLOSURE LEFT GREAT TOE (Left Foot)     Patient location during evaluation: PACU Anesthesia Type: MAC Level of consciousness: awake Pain management: pain level controlled Vital Signs Assessment: post-procedure vital signs reviewed and stable Respiratory status: spontaneous breathing Cardiovascular status: stable Postop Assessment: no apparent nausea or vomiting Anesthetic complications: no   No complications documented.  Last Vitals:  Vitals:   01/06/21 1045 01/06/21 1100  BP: (!) 147/66   Pulse: (!) 57 (!) 57  Resp: 15 18  Temp:    SpO2: 92% 96%    Last Pain:  Vitals:   01/06/21 1100  TempSrc:   PainSc: 0-No pain                 John F Salome Arnt

## 2021-01-06 NOTE — Interval H&P Note (Signed)
History and Physical Interval Note:  01/06/2021 9:59 AM  Richard Russell  has presented today for surgery, with the diagnosis of ULCER LEFT GREAT TOE.  The various methods of treatment have been discussed with the patient and family. After consideration of risks, benefits and other options for treatment, the patient has consented to  Procedure(s): WOUND DEBRIDEMENT AND CLOSURE LEFT GREAT TOE (Left) as a surgical intervention.  The patient's history has been reviewed, patient examined, no change in status, stable for surgery.  I have reviewed the patient's chart and labs.  Questions were answered to the patient's satisfaction.     Park Liter

## 2021-01-07 ENCOUNTER — Encounter (HOSPITAL_BASED_OUTPATIENT_CLINIC_OR_DEPARTMENT_OTHER): Payer: Self-pay | Admitting: Podiatry

## 2021-01-08 NOTE — Addendum Note (Signed)
Addendum  created 01/08/21 1103 by Leilani Able, MD   Clinical Note Signed

## 2021-01-12 ENCOUNTER — Other Ambulatory Visit: Payer: Self-pay

## 2021-01-12 ENCOUNTER — Other Ambulatory Visit (HOSPITAL_BASED_OUTPATIENT_CLINIC_OR_DEPARTMENT_OTHER): Payer: Self-pay

## 2021-01-12 ENCOUNTER — Ambulatory Visit (INDEPENDENT_AMBULATORY_CARE_PROVIDER_SITE_OTHER): Payer: Non-veteran care | Admitting: Podiatry

## 2021-01-12 DIAGNOSIS — Z659 Problem related to unspecified psychosocial circumstances: Secondary | ICD-10-CM | POA: Insufficient documentation

## 2021-01-12 DIAGNOSIS — Z7189 Other specified counseling: Secondary | ICD-10-CM | POA: Insufficient documentation

## 2021-01-12 DIAGNOSIS — L97523 Non-pressure chronic ulcer of other part of left foot with necrosis of muscle: Secondary | ICD-10-CM

## 2021-01-12 DIAGNOSIS — H524 Presbyopia: Secondary | ICD-10-CM | POA: Insufficient documentation

## 2021-01-12 NOTE — Progress Notes (Signed)
  Subjective:  Patient ID: Richard Russell, male    DOB: 1950/08/27,  MRN: [id number]  Chief Complaint  Patient presents with  . Routine Post Op    POV #1 DOS 01/06/2021 LT GREAT TOE WOUND DEBRIDEMENT & CLOSURE/REPAIR OF WOUND DEHISCENCE Denies any f/c/n/v/concerns.    DOS: 01/06/21 Procedure: Left great toe delayed wound closure  70 y.o. male presents with the above complaint. History confirmed with patient.   Objective:  Physical Exam: no tenderness at the surgical site, no edema noted and calf supple, nontender. Incision: healing well, no significant drainage, no dehiscence, no significant erythema  Assessment:   1. 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  Post-operative State -Ok to start showering at this time. Advised they cannot soak. -Dressing applied consisting of medihoney, sterile gauze, kerlix and ACE bandage -WBAT in Surgical shoe  -Pt to apply medihoney daily   Return in about 1 week (around 01/19/2021).

## 2021-01-29 ENCOUNTER — Other Ambulatory Visit: Payer: Self-pay

## 2021-01-29 ENCOUNTER — Ambulatory Visit (INDEPENDENT_AMBULATORY_CARE_PROVIDER_SITE_OTHER): Payer: Non-veteran care | Admitting: Podiatry

## 2021-01-29 DIAGNOSIS — L97523 Non-pressure chronic ulcer of other part of left foot with necrosis of muscle: Secondary | ICD-10-CM | POA: Diagnosis not present

## 2021-01-29 NOTE — Progress Notes (Signed)
  Subjective:  Patient ID: Richard Russell, male    DOB: 1950-11-02,  MRN: [id number]  Chief Complaint  Patient presents with   Routine Post Op    POV#2 No new concerns, denies fever/chills/nausea/vomiting.   DOS: 01/06/21 Procedure: Left great toe delayed wound closure  70 y.o. male presents with the above complaint. History confirmed with patient.   Objective:  Physical Exam: no tenderness at the surgical site, no edema noted and calf supple, nontender. Incision: healing well, no significant drainage, no dehiscence, no significant erythema  Assessment:   1. 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  Post-operative State -Sutures removed -Ok to start showering at this time. Advised they cannot soak. -Dressing applied consisting of medihoney and band-aid -WBAT in Surgical shoe  -Pt to apply medihoney daily  -F/u in 1 week for staple removal.  Return in about 1 month (around 02/28/2021).

## 2021-02-05 ENCOUNTER — Encounter: Payer: Non-veteran care | Admitting: Podiatry

## 2021-02-13 ENCOUNTER — Ambulatory Visit (INDEPENDENT_AMBULATORY_CARE_PROVIDER_SITE_OTHER): Payer: Non-veteran care | Admitting: Podiatry

## 2021-02-13 ENCOUNTER — Other Ambulatory Visit: Payer: Self-pay

## 2021-02-13 DIAGNOSIS — L97523 Non-pressure chronic ulcer of other part of left foot with necrosis of muscle: Secondary | ICD-10-CM

## 2021-02-13 DIAGNOSIS — Z9889 Other specified postprocedural states: Secondary | ICD-10-CM

## 2021-02-13 NOTE — Progress Notes (Signed)
  Subjective:  Patient ID: BONIFACIO PRUDEN, male    DOB: 10/26/1950,  MRN: [id number]  Chief Complaint  Patient presents with   Foot Ulcer    POV #3 DOS 01/06/2021 LT GREAT TOE WOUND DEBRIDEMENT & CLOSURE/REPAIR OF WOUND DEHISCENCE   DOS: 01/06/21 Procedure: Left great toe delayed wound closure  70 y.o. male presents with the above complaint. History confirmed with patient. Doing very well states the sore is doing better, it is not draining nor odorous.  Objective:  Physical Exam: no tenderness at the surgical site, no edema noted and calf supple, nontender. Incision: healing well, small open area at medial aspect. No W/E/SOI  Assessment:   1. 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 Post-operative state     Plan:  Patient was evaluated and treated and all questions answered.  Post-operative State -Staples removed -Ok to start showering at this time. Advised they cannot soak. -Dressing applied consisting of medihoney and band-aid -Will set up for fabrication of toe filler.  Return in about 3 weeks (around 03/06/2021) for Wound Care.

## 2021-02-16 ENCOUNTER — Telehealth: Payer: Self-pay | Admitting: Podiatry

## 2021-02-16 NOTE — Telephone Encounter (Signed)
Pt returned my call and said he usually goes to the [facility].   I explained that since covid we have not been able to get any orthotics/prosthetic devices approved thru the [place] or [place] [facility]. I did have a couple of numbers of contacts for the [place] and the [place] [facility] but I called and was not able to leave a message or speak to anyone.  Pt is going to [facility] tomorrow to see what he can find out and let me know if I need to send a rx or what. We have kept his appt for 7.5 just in case.

## 2021-02-16 NOTE — Telephone Encounter (Signed)
Left message for pt to call to discuss appt scheduled for toe filler insert. Pt has veteran's insurance and we cannot get authorizations for dme thru them.

## 2021-02-19 ENCOUNTER — Telehealth: Payer: Self-pay | Admitting: Podiatry

## 2021-02-19 NOTE — Telephone Encounter (Signed)
Pt would like for you to give him a call asap. Please advise.

## 2021-02-23 ENCOUNTER — Other Ambulatory Visit: Payer: Non-veteran care

## 2021-02-26 ENCOUNTER — Telehealth: Payer: Self-pay | Admitting: Podiatry

## 2021-02-26 NOTE — Telephone Encounter (Signed)
Pt called stating he contacted the va and they have sent over the information needed for pt to get the inserts. I explained that we have not received it and that the numbers I had previously called to get the Purchase order for inserts there is no answer and I am not able to leave a message. I asked pt to have them fax something to me directly(include my name) and or get me a direct contact number and I will see What I can do. I explained that we need the Purchase order from the prosthetic dept directly.

## 2021-03-01 ENCOUNTER — Telehealth: Payer: Self-pay | Admitting: Podiatry

## 2021-03-01 NOTE — Telephone Encounter (Signed)
Noted thanks °

## 2021-03-01 NOTE — Telephone Encounter (Signed)
Received voicemail on 7.7 @ 238pm from [place] a nurse with [facility] about pt needing a referral for inserts. He is needing more information.   I returned call and left message to call me back that we are needing a Purchase order for the inserts.

## 2021-03-08 ENCOUNTER — Telehealth: Payer: Self-pay | Admitting: Podiatry

## 2021-03-08 NOTE — Telephone Encounter (Signed)
Received a call from [place] from [place] [facility] and he is stating if we email him a dme request (dme order with what is needed and cost) and he will work on getting a Architect order from prosthetics. His email is [email or web address].

## 2021-03-08 NOTE — Telephone Encounter (Signed)
Is this something you can put together? Let me know what you need from me

## 2021-03-09 ENCOUNTER — Other Ambulatory Visit: Payer: Self-pay | Admitting: Podiatry

## 2021-03-09 DIAGNOSIS — L97523 Non-pressure chronic ulcer of other part of left foot with necrosis of muscle: Secondary | ICD-10-CM

## 2021-03-09 DIAGNOSIS — Z89412 Acquired absence of left great toe: Secondary | ICD-10-CM

## 2021-03-09 NOTE — Telephone Encounter (Signed)
Emailed quote to [facility]

## 2021-03-11 ENCOUNTER — Telehealth: Payer: Self-pay | Admitting: Podiatry

## 2021-03-11 NOTE — Telephone Encounter (Signed)
Received auth from va for dme.. I left message for pt to call to schedule an appt to be measured for them.Marland KitchenMarland Kitchen

## 2021-03-11 NOTE — Telephone Encounter (Signed)
Great.  Thanks

## 2021-03-12 ENCOUNTER — Other Ambulatory Visit: Payer: Self-pay

## 2021-03-12 ENCOUNTER — Ambulatory Visit (INDEPENDENT_AMBULATORY_CARE_PROVIDER_SITE_OTHER): Payer: Non-veteran care | Admitting: Podiatry

## 2021-03-12 DIAGNOSIS — L97523 Non-pressure chronic ulcer of other part of left foot with necrosis of muscle: Secondary | ICD-10-CM

## 2021-03-12 DIAGNOSIS — Z9889 Other specified postprocedural states: Secondary | ICD-10-CM

## 2021-03-19 NOTE — Progress Notes (Signed)
  Subjective:  Patient ID: Richard Russell, male    DOB: Jan 04, 1951,  MRN: [id number]  Chief Complaint  Patient presents with   wound care     3w wound care/measure for  L5000 and diabetic shoes/inserts   DOS: 01/06/21 Procedure: Left great toe delayed wound closure  70 y.o. male presents with the above complaint. History confirmed with patient.   Objective:  Physical Exam: no tenderness at the surgical site, no edema noted and calf supple, nontender. Incision: healing well, small open area at medial aspect. No W/E/SOI  Assessment:   1. 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 Post-operative state      Plan:  Patient was evaluated and treated and all questions answered.  Post-operative State -Wound continues to improve this was minimally debrided today and dressed with Silvadene and Band-Aid. -He was casted for toe filler and diabetic shoes.  Authorization was previously obtained from the [place] for these.  Foot benefit from carbon fiber plate to the plantar aspect of the toe filler to avoid excessive distal pressure  No follow-ups on file.

## 2021-03-22 ENCOUNTER — Telehealth: Payer: Self-pay | Admitting: Podiatry

## 2021-03-22 NOTE — Telephone Encounter (Signed)
Pt left 2 messages of Friday after I had left asking when do I think his shoes and inserts would be in...   I returned call and left message they should be shipping sometime next week and to call me to schedule an appt for 8.18 as that is my next available appt day.

## 2021-03-23 NOTE — Telephone Encounter (Signed)
Pt returned my call and I have scheduled him for 8.18 and told him if they are not in I would call him the day before to r/s appt.

## 2021-04-08 ENCOUNTER — Other Ambulatory Visit: Payer: Non-veteran care

## 2021-04-27 IMAGING — CR DG KNEE 1-2V*R*
3 series · 3 of 3 positions shown · non-contrast
Comparison: None.

CLINICAL DATA: Pain and swelling

EXAM:
RIGHT KNEE - 1-2 VIEW

[knee ap]
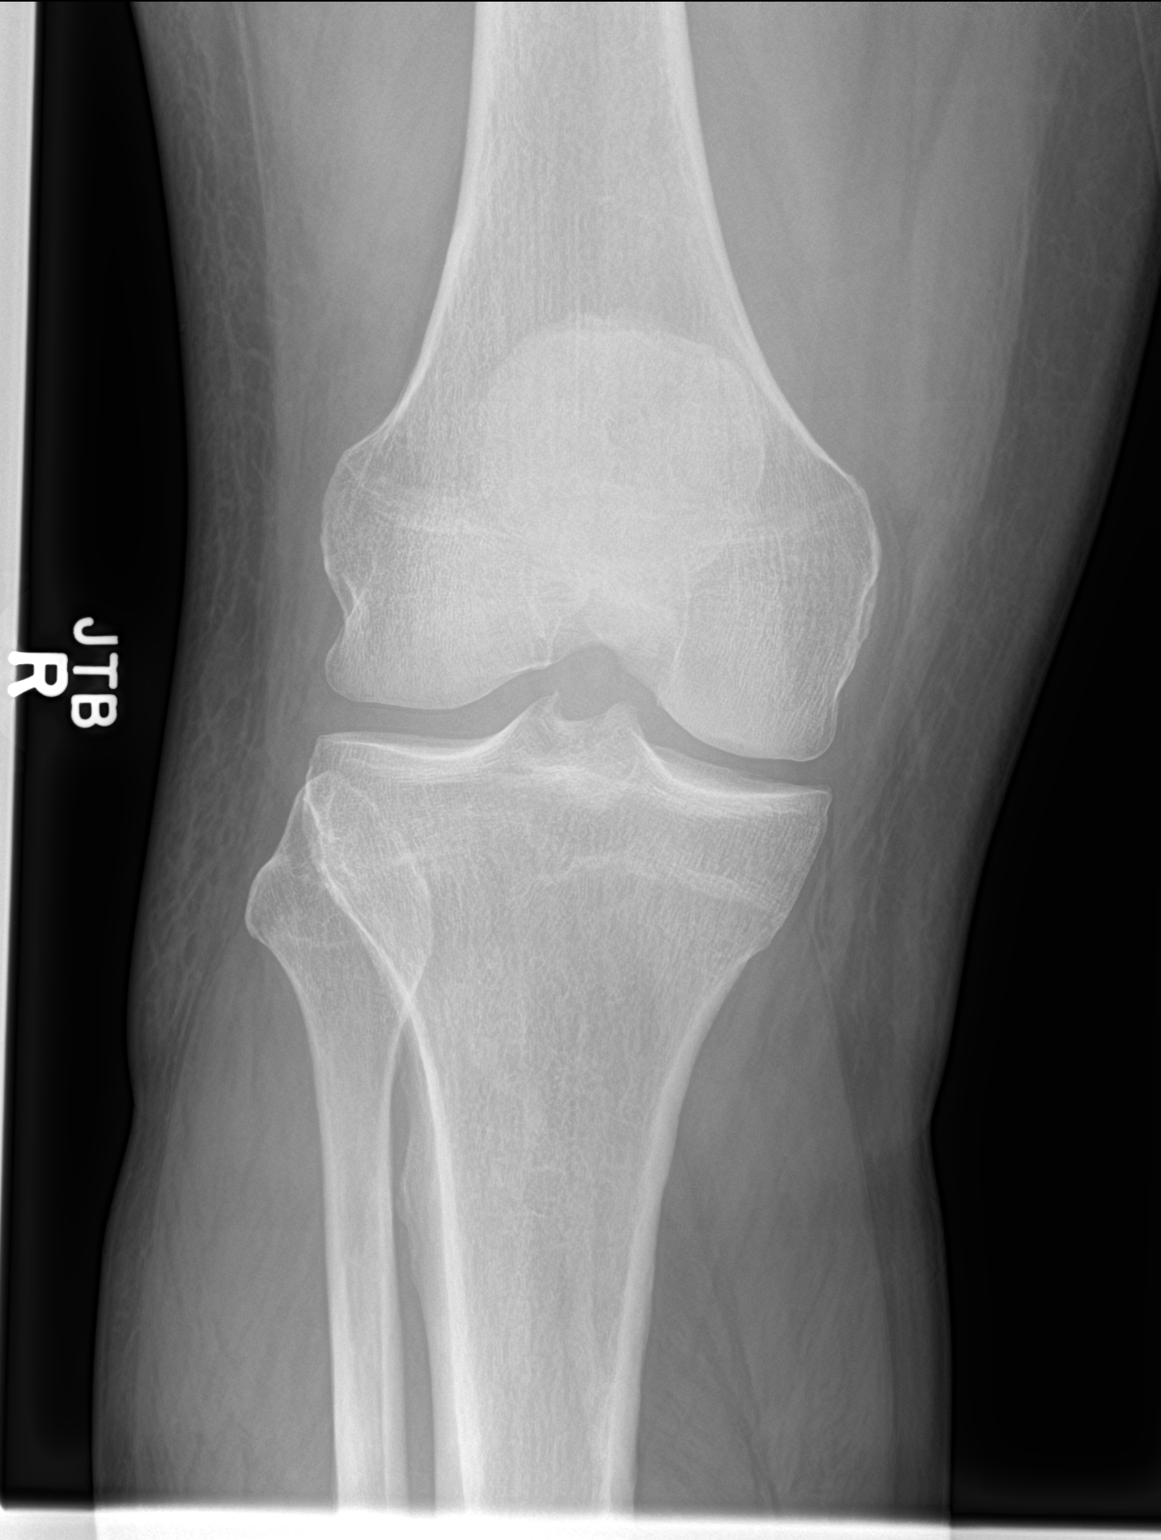

[knee lat (1 of 2)]
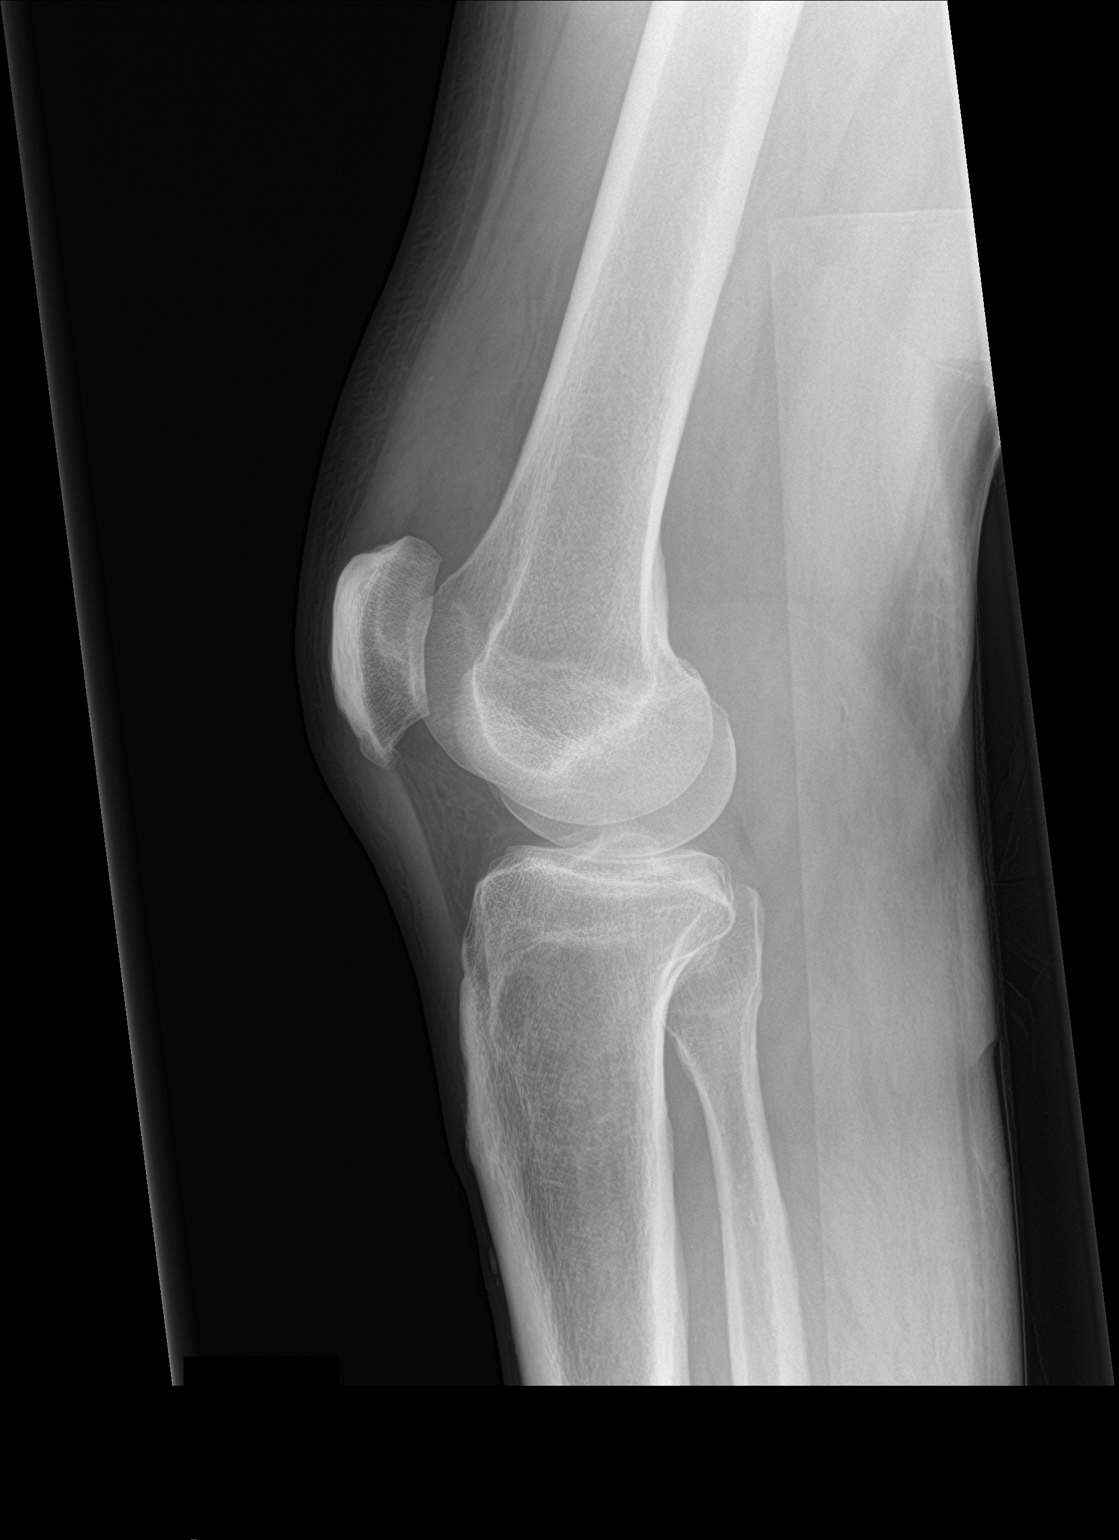

[knee lat (2 of 2)]
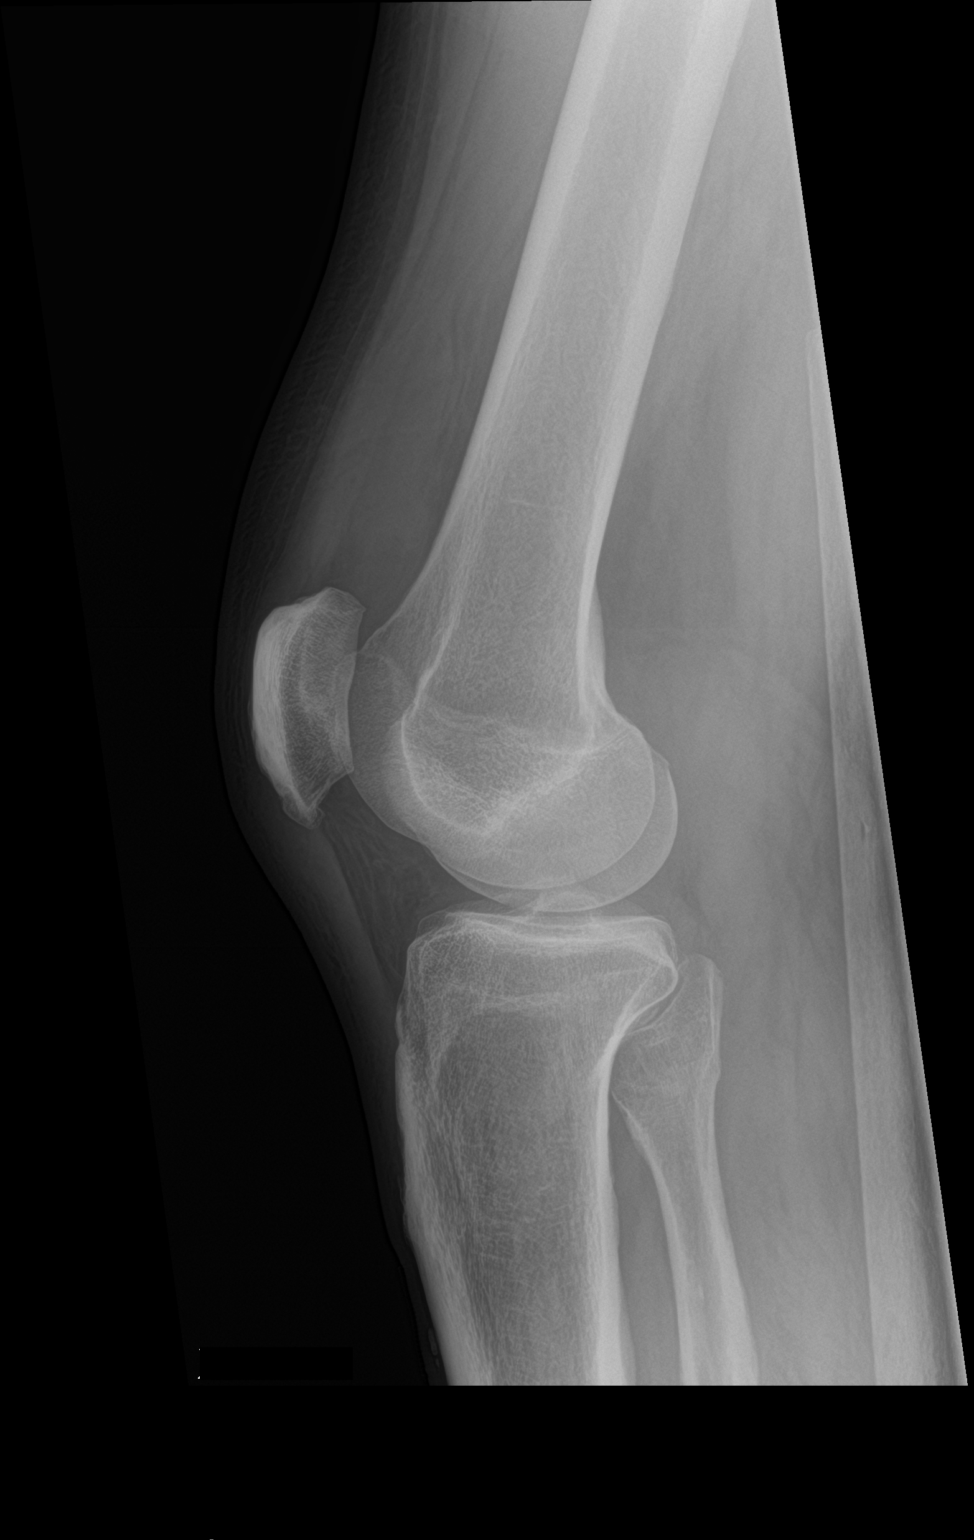

[3 of 3 positions shown; findings below may reference images not displayed]

FINDINGS: Frontal and lateral views obtained. No evident fracture or
dislocation. No joint effusion. Joint spaces appear normal. No
erosive change.
IMPRESSION: No fracture, dislocation, or joint effusion. No evident arthropathy.

## 2021-04-28 ENCOUNTER — Telehealth: Payer: Self-pay | Admitting: Podiatry

## 2021-04-28 NOTE — Telephone Encounter (Signed)
Left message for pt that the shoes and inserts should be coming in end of this week beginning of next and to call to schedule an appt.

## 2021-05-07 ENCOUNTER — Other Ambulatory Visit: Payer: Self-pay

## 2021-05-07 ENCOUNTER — Other Ambulatory Visit: Payer: Non-veteran care

## 2021-05-07 DIAGNOSIS — E11621 Type 2 diabetes mellitus with foot ulcer: Secondary | ICD-10-CM

## 2021-05-07 DIAGNOSIS — Z89412 Acquired absence of left great toe: Secondary | ICD-10-CM

## 2021-05-12 ENCOUNTER — Telehealth: Payer: Self-pay | Admitting: Podiatry

## 2021-05-12 NOTE — Telephone Encounter (Signed)
Richard Russell sent to [name]

## 2021-05-21 ENCOUNTER — Ambulatory Visit: Payer: Non-veteran care | Admitting: Podiatry

## 2021-06-18 ENCOUNTER — Ambulatory Visit: Payer: Medicare Other | Admitting: Podiatry

## 2021-06-18 ENCOUNTER — Other Ambulatory Visit: Payer: Self-pay
